# Patient Record
Sex: Female | Born: 1975 | Race: Black or African American | Hispanic: No | Marital: Single | Smoking: Current some day smoker
Health system: Southern US, Community
[De-identification: ages and names within clinical notes are randomized; demographics above are authoritative.]

## PROBLEM LIST (undated history)

## (undated) ENCOUNTER — Emergency Department (HOSPITAL_COMMUNITY): Discharge status: Against Medical Advice | Payer: Self-pay

## (undated) DIAGNOSIS — E0500 Thyrotoxicosis with diffuse goiter without thyrotoxic crisis or storm: Secondary | ICD-10-CM

## (undated) DIAGNOSIS — G8929 Other chronic pain: Secondary | ICD-10-CM

## (undated) DIAGNOSIS — E039 Hypothyroidism, unspecified: Secondary | ICD-10-CM

## (undated) DIAGNOSIS — M549 Dorsalgia, unspecified: Secondary | ICD-10-CM

## (undated) DIAGNOSIS — J9859 Other diseases of mediastinum, not elsewhere classified: Secondary | ICD-10-CM

## (undated) DIAGNOSIS — E320 Persistent hyperplasia of thymus: Principal | ICD-10-CM

## (undated) HISTORY — DX: Dorsalgia, unspecified: M54.9

## (undated) HISTORY — DX: Other chronic pain: G89.29

## (undated) HISTORY — DX: Other diseases of mediastinum, not elsewhere classified: J98.59

## (undated) HISTORY — DX: Thyrotoxicosis with diffuse goiter without thyrotoxic crisis or storm: E05.00

## (undated) HISTORY — PX: LIPOSUCTION: SHX10

## (undated) HISTORY — DX: Hypothyroidism, unspecified: E03.9

## (undated) HISTORY — DX: Persistent hyperplasia of thymus: E32.0

## (undated) HISTORY — PX: NM THYROID STIM SUPRESS: HXRAD602

---

## 2008-04-06 ENCOUNTER — Emergency Department (HOSPITAL_COMMUNITY): Admission: EM | Admit: 2008-04-06 | Discharge: 2008-04-06 | Payer: Self-pay | Admitting: Emergency Medicine

## 2010-05-26 LAB — COMPREHENSIVE METABOLIC PANEL
AST: 17 U/L (ref 0–37)
CO2: 26 mEq/L (ref 19–32)
Calcium: 8.7 mg/dL (ref 8.4–10.5)
Creatinine, Ser: 0.63 mg/dL (ref 0.4–1.2)
GFR calc Af Amer: >60 mL/min (ref >60)
GFR calc non Af Amer: >60 mL/min (ref >60)
Glucose, Bld: 99 mg/dL (ref 70–99)

## 2010-05-26 LAB — CBC
MCHC: 34.2 g/dL (ref 30.0–36.0)
MCV: 85.3 fL (ref 78.0–100.0)
RBC: 4.36 MIL/uL (ref 3.87–5.11)

## 2010-05-26 LAB — URINALYSIS, ROUTINE W REFLEX MICROSCOPIC
Leukocytes, UA: NEGATIVE
Nitrite: NEGATIVE
Specific Gravity, Urine: 1.038 — ABNORMAL HIGH (ref 1.005–1.030)
pH: 6 (ref 5.0–8.0)

## 2010-05-26 LAB — URINE MICROSCOPIC-ADD ON

## 2010-05-26 LAB — LIPASE, BLOOD: Lipase: 19 U/L (ref 11–59)

## 2010-05-26 LAB — POCT PREGNANCY, URINE: Preg Test, Ur: NEGATIVE

## 2010-05-26 LAB — DIFFERENTIAL
Lymphocytes Relative: 4 % — ABNORMAL LOW (ref 12–46)
Lymphs Abs: 0.3 10*3/uL — ABNORMAL LOW (ref 0.7–4.0)
Neutrophils Relative %: 92 % — ABNORMAL HIGH (ref 43–77)

## 2011-06-01 ENCOUNTER — Encounter (HOSPITAL_COMMUNITY): Payer: Self-pay

## 2011-06-01 ENCOUNTER — Emergency Department (HOSPITAL_COMMUNITY)
Admission: EM | Admit: 2011-06-01 | Discharge: 2011-06-01 | Disposition: A | Discharge status: Home / Self Care | Payer: Self-pay | Attending: Emergency Medicine | Admitting: Emergency Medicine

## 2011-06-01 DIAGNOSIS — J45909 Unspecified asthma, uncomplicated: Secondary | ICD-10-CM | POA: Insufficient documentation

## 2011-06-01 DIAGNOSIS — F17200 Nicotine dependence, unspecified, uncomplicated: Secondary | ICD-10-CM | POA: Insufficient documentation

## 2011-06-01 DIAGNOSIS — J45901 Unspecified asthma with (acute) exacerbation: Secondary | ICD-10-CM

## 2011-06-01 MED ORDER — IPRATROPIUM BROMIDE 0.02 % IN SOLN
1.0000 mg | Freq: Once | RESPIRATORY_TRACT | Status: AC
  Administered 2011-06-01: 1 mg via RESPIRATORY_TRACT
  Filled 2011-06-01: qty 5

## 2011-06-01 MED ORDER — ALBUTEROL SULFATE (2.5 MG/3ML) 0.083% IN NEBU: 2.5000 mg | INHALATION_SOLUTION | RESPIRATORY_TRACT | Status: DC | PRN

## 2011-06-01 MED ORDER — ALBUTEROL SULFATE (5 MG/ML) 0.5% IN NEBU
10.0000 mg | INHALATION_SOLUTION | Freq: Once | RESPIRATORY_TRACT | Status: AC
  Administered 2011-06-01: 10 mg via RESPIRATORY_TRACT
  Filled 2011-06-01: qty 2

## 2011-06-01 MED ORDER — PREDNISONE 20 MG PO TABS
60.0000 mg | ORAL_TABLET | Freq: Once | ORAL | Status: AC
  Administered 2011-06-01: 60 mg via ORAL
  Filled 2011-06-01: qty 3

## 2011-06-01 MED ORDER — PREDNISONE 20 MG PO TABS: 20.0000 mg | ORAL_TABLET | Freq: Every day | ORAL | Status: AC

## 2011-06-01 NOTE — ED Provider Notes (Signed)
History     CSN: [id number]  Arrival date & time 06/01/11  1032   First MD Initiated Contact with Patient 06/01/11 1040      Chief Complaint  Patient presents with  . Asthma     HPI Pt was seen at 1105.  Per pt, c/o gradual onset and persistence of constant cough, wheezing, and SOB for the past 3 days.  Describes her symptoms as per her usual "asthma attack."  Pt has been using her home neb and MDI without relief.  Denies CP/palpitations, no abd pain, no N/V/D, no back pain, no fevers.      Past Medical History  Diagnosis Date  . Asthma     History reviewed. No pertinent past surgical history.    History  Substance Use Topics  . Smoking status: Current Everyday Smoker  . Smokeless tobacco: Not on file  . Alcohol Use: No    Review of Systems ROS: Statement: All systems negative except as marked or noted in the HPI; Constitutional: Negative for fever and chills. ; ; Eyes: Negative for eye pain, redness and discharge. ; ; ENMT: Negative for ear pain, hoarseness, nasal congestion, sinus pressure and sore throat. ; ; Cardiovascular: Negative for chest pain, palpitations, diaphoresis, and peripheral edema. ; ; Respiratory: +cough, wheezing, SOB.  Negative for stridor. ; ; Gastrointestinal: Negative for nausea, vomiting, diarrhea, abdominal pain, blood in stool, hematemesis, jaundice and rectal bleeding. . ; ; Genitourinary: Negative for dysuria, flank pain and hematuria. ; ; Musculoskeletal: Negative for back pain and neck pain. Negative for swelling and trauma.; ; Skin: Negative for pruritus, rash, abrasions, blisters, bruising and skin lesion.; ; Neuro: Negative for headache, lightheadedness and neck stiffness. Negative for weakness, altered level of consciousness , altered mental status, extremity weakness, paresthesias, involuntary movement, seizure and syncope.      Allergies  Review of patient's allergies indicates no known allergies.  Home Medications   Current Outpatient  Rx  Name Route Sig Dispense Refill  . ALBUTEROL SULFATE HFA 108 (90 BASE) MCG/ACT IN AERS Inhalation Inhale 2 puffs into the lungs every 6 (six) hours as needed. Shortness of breath    . DIPHENHYDRAMINE HCL (SLEEP) 25 MG PO TABS Oral Take 25 mg by mouth daily as needed.    Marland Kitchen LORATADINE 10 MG PO TABS Oral Take 10 mg by mouth daily.      BP 134/71  Pulse 83  Temp(Src) 97.7 F (36.5 C) (Oral)  Resp 20  SpO2 100%  LMP 05/18/2011  Physical Exam 1110: Physical examination:  Nursing notes reviewed; Vital signs and O2 SAT reviewed;  Constitutional: Well developed, Well nourished, Well hydrated, In no acute distress; Head:  Normocephalic, atraumatic; Eyes: EOMI, PERRL, No scleral icterus; ENMT: Mouth and pharynx normal, Mucous membranes moist; Neck: Supple, Full range of motion, No lymphadenopathy; Cardiovascular: Regular rate and rhythm, No murmur, rub, or gallop; Respiratory: Breath sounds diminished, clear & equal bilaterally, No wheezes, speaking full sentences, Normal respiratory effort/excursion; Chest: Nontender, Movement normal; Abdomen: Soft, Nontender, Nondistended, Normal bowel sounds; Extremities: Pulses normal, No tenderness, No edema, No calf edema or asymmetry.; Neuro: AA&Ox3, Major CN grossly intact.  No gross focal motor or sensory deficits in extremities.; Skin: Color normal, Warm, Dry, no rash.    ED Course  Procedures    MDM  MDM Reviewed: nursing note and vitals Interpretation: ECG    Date: 06/01/2011  Rate: 80  Rhythm: normal sinus rhythm  QRS Axis: normal  Intervals: normal  ST/T Wave abnormalities:  normal  Conduction Disutrbances:none  Narrative Interpretation:   Old EKG Reviewed: none available.    1:24 PM:  Pt feels "much better" after neb and prednisone.  Wants to go home now.  Lungs CTA bilat, resps easy, speaking full sentences, Sats 99% R/A.  Dx d/w pt and family.  Questions answered.  Verb understanding, agreeable to d/c home with outpt  f/u.             Laray Anger, DO 06/03/11 1215

## 2011-06-01 NOTE — ED Notes (Signed)
[facility] contacted to start pt's neb tx

## 2011-06-01 NOTE — ED Notes (Signed)
Pt in from home with exacerbation of asthma states no relief with inhaler denies pain

## 2011-06-01 NOTE — ED Notes (Signed)
Pt reports difficulty breathing intermittently since Saturday.  Pt reports she's used her inhaler and neb tx without relief.  Pt also reports non-productive cough.  Pt reports hx of asthma-has been hospitalized in the past and was also intubated.  Pt is a smoker.  Pt denies cp but reports feeling tightness.  Lungs sounds clear bilaterally.  Pt in no distress.  Denies any fever

## 2011-06-01 NOTE — Discharge Instructions (Signed)
RESOURCE GUIDE  Dental Problems  Patients with Medicaid: [facility]                     [facility] [address]                                           [address] Phone:  [phone]                                                  Phone:  [phone]  If unable to pay or uninsured, contact:  [facility] or [facility] to become qualified for the adult dental clinic.  Chronic Pain Problems Contact [facility]  [phone] Patients need to be referred by their primary care doctor.  Insufficient Money for Medicine Contact [employer]:  call "211" or [facility] 271-5999.  No Primary Care Doctor Call [facility]  [phone] Other agencies that provide inexpensive medical care    [facility]  [phone]    [facility]  [phone]    [facility]  [phone]    Women[facility] Clinic  [phone]    [facility]  [phone]    [facility]  [phone]  Psychological Services [facility]  [phone] [facility]  [phone] [facility]   [phone] (emergency services [phone])  Substance Abuse Resources [facility]  336-882-2125 [facility] 336-784-9470 [facility] 336-285-9073 [facility] 336-845-3988 Residential & Outpatient Substance Abuse Program  800-659-3381  Abuse/Neglect [place] Child Abuse Hotline (336) 641-3795 [place] Child Abuse Hotline 800-378-5315 (After Hours)  Emergency Shelter [facility] (336) 271-5985  M[facility] (336) 275-9566 [facility] (704) 372-4663  MRSA Hotline #:   832-7006    [place] [facility]  [facility] [place]     [employer]                          [facility] [address] [place]                       [address]      [address]  [place]                                                [place]                            [place] Phone:  [phone]                                   Phone:  [phone]                 Phone:  [phone]  [facility] Phone:  [phone]    [facility] Child Abuse Hotline 984 504 2377 605-415-7277 (After Hours)   Take the prescriptions as directed.  Use your albuterol inhaler (2 to 4 puffs) or your albuterol nebulizer (1 unit dose) every 4 hours for the next 7 days, then as needed for cough, wheezing, or shortness of breath.  Call your regular medical doctor today to schedule a follow up appointment within the next 3 days.  Return to the Emergency Department immediately sooner if worsening.

## 2012-03-11 DIAGNOSIS — E320 Persistent hyperplasia of thymus: Secondary | ICD-10-CM

## 2012-03-11 HISTORY — DX: Persistent hyperplasia of thymus: E32.0

## 2012-03-21 ENCOUNTER — Encounter (HOSPITAL_COMMUNITY): Payer: Self-pay | Admitting: Emergency Medicine

## 2012-03-21 ENCOUNTER — Inpatient Hospital Stay (HOSPITAL_COMMUNITY)
Admission: EM | Admit: 2012-03-21 | Discharge: 2012-03-23 | DRG: 203 | Disposition: A | Discharge status: Home / Self Care | Payer: MEDICAID | Attending: Internal Medicine | Admitting: Internal Medicine

## 2012-03-21 ENCOUNTER — Emergency Department (HOSPITAL_COMMUNITY): Payer: Self-pay

## 2012-03-21 DIAGNOSIS — J9859 Other diseases of mediastinum, not elsewhere classified: Secondary | ICD-10-CM

## 2012-03-21 DIAGNOSIS — Z8639 Personal history of other endocrine, nutritional and metabolic disease: Secondary | ICD-10-CM | POA: Clinically undetermined

## 2012-03-21 DIAGNOSIS — J45901 Unspecified asthma with (acute) exacerbation: Principal | ICD-10-CM | POA: Diagnosis present

## 2012-03-21 DIAGNOSIS — F17200 Nicotine dependence, unspecified, uncomplicated: Secondary | ICD-10-CM | POA: Diagnosis present

## 2012-03-21 DIAGNOSIS — R000 Tachycardia, unspecified: Secondary | ICD-10-CM | POA: Diagnosis present

## 2012-03-21 DIAGNOSIS — D509 Iron deficiency anemia, unspecified: Secondary | ICD-10-CM | POA: Diagnosis present

## 2012-03-21 DIAGNOSIS — D649 Anemia, unspecified: Secondary | ICD-10-CM | POA: Diagnosis present

## 2012-03-21 DIAGNOSIS — Z720 Tobacco use: Secondary | ICD-10-CM | POA: Diagnosis present

## 2012-03-21 LAB — CBC WITH DIFFERENTIAL/PLATELET
Basophils Relative: 0 % (ref 0–1)
Eosinophils Relative: 0 % (ref 0–5)
HCT: 36.8 % (ref 36.0–46.0)
Hemoglobin: 11.9 g/dL — ABNORMAL LOW (ref 12.0–15.0)
MCHC: 32.3 g/dL (ref 30.0–36.0)
MCV: 79.8 fL (ref 78.0–100.0)
Monocytes Absolute: 0.2 10*3/uL (ref 0.1–1.0)
Monocytes Relative: 2 % — ABNORMAL LOW (ref 3–12)
Neutro Abs: 5.8 10*3/uL (ref 1.7–7.7)

## 2012-03-21 LAB — POCT I-STAT, CHEM 8
BUN: 13 mg/dL (ref 6–23)
Calcium, Ion: 1.19 mmol/L (ref 1.12–1.23)
Chloride: 108 mEq/L (ref 96–112)
Glucose, Bld: 120 mg/dL — ABNORMAL HIGH (ref 70–99)

## 2012-03-21 MED ORDER — ONDANSETRON HCL 4 MG PO TABS: 4.0000 mg | ORAL_TABLET | Freq: Four times a day (QID) | ORAL | Status: DC | PRN

## 2012-03-21 MED ORDER — SODIUM CHLORIDE 0.9 % IJ SOLN
3.0000 mL | Freq: Two times a day (BID) | INTRAMUSCULAR | Status: DC
  Administered 2012-03-22 (×3): 3 mL via INTRAVENOUS

## 2012-03-21 MED ORDER — ENOXAPARIN SODIUM 40 MG/0.4ML ~~LOC~~ SOLN
40.0000 mg | SUBCUTANEOUS | Status: DC
  Administered 2012-03-22: 40 mg via SUBCUTANEOUS
  Filled 2012-03-21: qty 0.4

## 2012-03-21 MED ORDER — ACETAMINOPHEN 650 MG RE SUPP: 650.0000 mg | Freq: Four times a day (QID) | RECTAL | Status: DC | PRN

## 2012-03-21 MED ORDER — ALBUTEROL SULFATE (5 MG/ML) 0.5% IN NEBU
2.5000 mg | INHALATION_SOLUTION | Freq: Four times a day (QID) | RESPIRATORY_TRACT | Status: DC
  Administered 2012-03-22 (×2): 2.5 mg via RESPIRATORY_TRACT
  Filled 2012-03-21 (×2): qty 0.5

## 2012-03-21 MED ORDER — METHYLPREDNISOLONE SODIUM SUCC 125 MG IJ SOLR
125.0000 mg | Freq: Once | INTRAMUSCULAR | Status: AC
  Administered 2012-03-21: 125 mg via INTRAVENOUS
  Filled 2012-03-21: qty 2

## 2012-03-21 MED ORDER — SODIUM CHLORIDE 0.9 % IV SOLN
INTRAVENOUS | Status: DC
  Administered 2012-03-22: 02:00:00 via INTRAVENOUS

## 2012-03-21 MED ORDER — POTASSIUM CHLORIDE CRYS ER 20 MEQ PO TBCR
20.0000 meq | EXTENDED_RELEASE_TABLET | Freq: Once | ORAL | Status: AC
  Administered 2012-03-21: 20 meq via ORAL
  Filled 2012-03-21: qty 1

## 2012-03-21 MED ORDER — ALBUTEROL (5 MG/ML) CONTINUOUS INHALATION SOLN
10.0000 mg/h | INHALATION_SOLUTION | RESPIRATORY_TRACT | Status: AC
  Administered 2012-03-21: 10 mg/h via RESPIRATORY_TRACT
  Filled 2012-03-21: qty 20

## 2012-03-21 MED ORDER — ALBUTEROL SULFATE (5 MG/ML) 0.5% IN NEBU
5.0000 mg | INHALATION_SOLUTION | Freq: Once | RESPIRATORY_TRACT | Status: AC
  Administered 2012-03-21: 5 mg via RESPIRATORY_TRACT
  Filled 2012-03-21: qty 1

## 2012-03-21 MED ORDER — ONDANSETRON HCL 4 MG/2ML IJ SOLN
4.0000 mg | Freq: Four times a day (QID) | INTRAMUSCULAR | Status: DC | PRN
  Administered 2012-03-22: 4 mg via INTRAVENOUS
  Filled 2012-03-21: qty 2

## 2012-03-21 MED ORDER — METHYLPREDNISOLONE SODIUM SUCC 40 MG IJ SOLR
40.0000 mg | INTRAMUSCULAR | Status: DC
  Filled 2012-03-21: qty 1

## 2012-03-21 MED ORDER — IPRATROPIUM BROMIDE 0.02 % IN SOLN
0.5000 mg | Freq: Once | RESPIRATORY_TRACT | Status: AC
  Administered 2012-03-21: 0.5 mg via RESPIRATORY_TRACT
  Filled 2012-03-21: qty 2.5

## 2012-03-21 MED ORDER — BUDESONIDE 0.25 MG/2ML IN SUSP
0.2500 mg | Freq: Two times a day (BID) | RESPIRATORY_TRACT | Status: DC
  Administered 2012-03-22 (×3): 0.25 mg via RESPIRATORY_TRACT
  Filled 2012-03-21 (×6): qty 2

## 2012-03-21 MED ORDER — ACETAMINOPHEN 325 MG PO TABS: 650.0000 mg | ORAL_TABLET | Freq: Four times a day (QID) | ORAL | Status: DC | PRN

## 2012-03-21 MED ORDER — ALBUTEROL SULFATE (5 MG/ML) 0.5% IN NEBU: 2.5000 mg | INHALATION_SOLUTION | RESPIRATORY_TRACT | Status: DC | PRN

## 2012-03-21 NOTE — ED Notes (Signed)
PA Browning at bedside. 

## 2012-03-21 NOTE — H&P (Signed)
Lacey Mccarty is an 37 y.o. female. Patient was seen and examined on March 21, 2012. PCP - none.   Chief Complaint: Shortness of breath. HPI: 37 year-old female with known history of asthma since age 59 and has been intubated twice previously last one 2 years ago presents with increasing shortness of breath over the last one week. Patient states that she has been having wheezing which has progressed and worsened and she had tried her friend's medications including prednisone which has not helped her. Chest x-ray did not show acute and in the ER patient was found to be wheezing and was given nebulizer treatment. Patient's wheezing is improved but still mildly short of breath. Also has experienced some pleuritic-type of chest pain since morning today. Pain happens only on deep coughing more on the right side of the chest. Denies any exertional symptoms fever chills productive cough.  Past Medical History  Diagnosis Date  . Asthma     Past Surgical History  Procedure Laterality Date  . Liposuction    . Cesarean section      Family History  Problem Relation Age of Onset  . HIV Mother   . Diabetes Mellitus II Mother   . Hypertension Father    Social History:  reports that she has been smoking.  She has never used smokeless tobacco. She reports that  drinks alcohol. She reports that she does not use illicit drugs.  Allergies: No Known Allergies   (Not in a hospital admission)  Results for orders placed during the hospital encounter of 03/21/12 (from the past 48 hour(s))  CBC WITH DIFFERENTIAL     Status: Abnormal   Collection Time    03/21/12  8:58 PM      Result Value Range   WBC 7.1  4.0 - 10.5 K/uL   RBC 4.61  3.87 - 5.11 MIL/uL   Hemoglobin 11.9 (*) 12.0 - 15.0 g/dL   HCT 78.4  69.6 - 29.5 %   MCV 79.8  78.0 - 100.0 fL   MCH 25.8 (*) 26.0 - 34.0 pg   MCHC 32.3  30.0 - 36.0 g/dL   RDW 28.4  13.2 - 44.0 %   Platelets 258  150 - 400 K/uL   Neutrophils Relative 83 (*) 43 -  77 %   Neutro Abs 5.8  1.7 - 7.7 K/uL   Lymphocytes Relative 15  12 - 46 %   Lymphs Abs 1.1  0.7 - 4.0 K/uL   Monocytes Relative 2 (*) 3 - 12 %   Monocytes Absolute 0.2  0.1 - 1.0 K/uL   Eosinophils Relative 0  0 - 5 %   Eosinophils Absolute 0.0  0.0 - 0.7 K/uL   Basophils Relative 0  0 - 1 %   Basophils Absolute 0.0  0.0 - 0.1 K/uL  POCT I-STAT, CHEM 8     Status: Abnormal   Collection Time    03/21/12  9:11 PM      Result Value Range   Sodium 142  135 - 145 mEq/L   Potassium 3.1 (*) 3.5 - 5.1 mEq/L   Chloride 108  96 - 112 mEq/L   BUN 13  6 - 23 mg/dL   Creatinine, Ser 1.02  0.50 - 1.10 mg/dL   Glucose, Bld 725 (*) 70 - 99 mg/dL   Calcium, Ion 3.66  4.40 - 1.23 mmol/L   TCO2 23  0 - 100 mmol/L   Hemoglobin 12.9  12.0 - 15.0 g/dL   HCT  38.0  36.0 - 46.0 %   Dg Chest 2 View  03/21/2012  *RADIOLOGY REPORT*  Clinical Data: Chest pain  CHEST - 2 VIEW  Comparison: None.  Findings: Relatively low lung volumes with crowding of perihilar bronchovascular structures.  No focal infiltrate.  No effusion. Heart size upper limits normal.  Regional bones unremarkable.  IMPRESSION:  No acute disease   Original Report Authenticated By: D. Andria Rhein, MD     Review of Systems  Constitutional: Negative.   HENT: Negative.   Eyes: Negative.   Respiratory: Positive for cough, shortness of breath and wheezing.   Cardiovascular: Positive for chest pain.  Gastrointestinal: Negative.   Genitourinary: Negative.   Musculoskeletal: Negative.   Skin: Negative.   Neurological: Negative.   Endo/Heme/Allergies: Negative.   Psychiatric/Behavioral: Negative.     Blood pressure 139/72, pulse 112, temperature 98.3 F (36.8 C), temperature source Oral, resp. rate 28, last menstrual period 03/18/2012, SpO2 100.00%. Physical Exam  Constitutional: She is oriented to person, place, and time. She appears well-developed and well-nourished. No distress.  HENT:  Head: Normocephalic and atraumatic.  Right Ear:  External ear normal.  Left Ear: External ear normal.  Nose: Nose normal.  Mouth/Throat: Oropharynx is clear and moist. No oropharyngeal exudate.  Eyes: Conjunctivae are normal. Pupils are equal, round, and reactive to light. Right eye exhibits no discharge. Left eye exhibits no discharge. No scleral icterus.  Neck: Normal range of motion. Neck supple.  Cardiovascular: Regular rhythm.   Mild tachycardia.  Respiratory: Effort normal and breath sounds normal. No respiratory distress. She has no wheezes. She has no rales.  GI: Soft. Bowel sounds are normal. She exhibits no distension. There is no tenderness. There is no rebound.  Musculoskeletal: She exhibits no edema and no tenderness.  Neurological: She is alert and oriented to person, place, and time.  Moves all extremities.  Skin: Skin is warm and dry. She is not diaphoretic.     Assessment/Plan #1. Asthma exacerbation - continue with nebulizer treatments Pulmicort and IV steroids. Since patient has pleuritic-type of chest pain we will check d-dimer and troponin. EKG unremarkable. #2. Tobacco abuse - strongly advised to quit smoking. #3. Mild normocytic normochromic anemia - follow CBC and if there is no significant fall in hemoglobin then further workup as outpatient.  CODE STATUS - full code.  Eduard Clos 03/21/2012, 9:58 PM

## 2012-03-21 NOTE — ED Provider Notes (Signed)
History     CSN: [id number]  Arrival date & time 03/21/12  1748   First MD Initiated Contact with Patient 03/21/12 1758      Chief Complaint  Patient presents with  . Asthma    (Consider location/radiation/quality/duration/timing/severity/associated sxs/prior treatment) HPI Comments: This is a 37 year old female, who presents emergency department with chief complaint of asthma exacerbation. Patient has past medical history remarkable for asthma. She states that she has been wheezing for the past 3 days. She is tried taking her inhaler with no relief. Additionally, she is tried taking prednisone that her friend gave her, this gave her some relief. She believes that the weather has been exacerbating her symptoms. She is not in any pain.  The history is provided by the patient. No language interpreter was used.    Past Medical History  Diagnosis Date  . Asthma     History reviewed. No pertinent past surgical history.  No family history on file.  History  Substance Use Topics  . Smoking status: Current Every Day Smoker  . Smokeless tobacco: Not on file  . Alcohol Use: No    OB History   Grav Para Term Preterm Abortions TAB SAB Ect Mult Living                  Review of Systems  All other systems reviewed and are negative.    Allergies  Review of patient's allergies indicates no known allergies.  Home Medications  No current outpatient prescriptions on file.  BP 136/81  Pulse 100  Temp(Src) 98.4 F (36.9 C) (Oral)  Resp 23  SpO2 100%  LMP 03/18/2012  Physical Exam  Nursing note and vitals reviewed. Constitutional: She is oriented to person, place, and time. She appears well-developed and well-nourished.  HENT:  Head: Normocephalic and atraumatic.  Eyes: Conjunctivae and EOM are normal. Pupils are equal, round, and reactive to light.  Neck: Normal range of motion. Neck supple.  Cardiovascular: Normal rate and regular rhythm.  Exam reveals no gallop and no  friction rub.   No murmur heard. Pulmonary/Chest: Effort normal. No respiratory distress. She has wheezes. She has no rales. She exhibits no tenderness.  Diffuse wheezes heard bilaterally  Abdominal: Soft. Bowel sounds are normal. She exhibits no distension and no mass. There is no tenderness. There is no rebound and no guarding.  Musculoskeletal: Normal range of motion. She exhibits no edema and no tenderness.  Neurological: She is alert and oriented to person, place, and time.  Skin: Skin is warm and dry.  Psychiatric: She has a normal mood and affect. Her behavior is normal. Judgment and thought content normal.    ED Course  Procedures (including critical care time)  Labs Reviewed - No data to display No results found. Will treat the patient with Atrovent, and albuterol, will give patient IV Solu-Medrol, and reevaluate. 10:43 PM Patient has received albuterol, Atrovent, and IV Solu-Medrol. Additionally she is received a continuous nebulizer for one hour. She reports still feeling out of breath, but her wheezing has improved. She expresses concern about going home, she has been hospitalized for asthma exacerbations in the past. I discussed patient with Dr. Ranae Palms, who tells the patient likely needs to be admitted. I will consult the hospitalist team for admission. 1. Asthma exacerbation   2. Anemia   3. Tobacco abuse       MDM  37 year old female with asthma exacerbation. I reassessed the patient several times throughout her ED course, and found that  her wheezing was improving, however her shortness of breath remains. She denied any chest pain, and I believe this to be acute bronchospasm secondary to asthma exacerbation. Have consult the hospitalist team, who will admit the patient.        Roxy Horseman, PA-C 03/21/12 2245

## 2012-03-21 NOTE — ED Notes (Signed)
Attempted to call report to [facility]. They stated they did not have a nurse to take report until 2300. Will call back at 2300.

## 2012-03-21 NOTE — ED Notes (Signed)
Patient transported to X-ray 

## 2012-03-21 NOTE — ED Notes (Signed)
States that she has had shortness of breath for the past 3 days. States that she has taken her rescue inhaler and nebs with no resolve.

## 2012-03-21 NOTE — ED Provider Notes (Signed)
Medical screening examination/treatment/procedure(s) were performed by non-physician practitioner and as supervising physician I was immediately available for consultation/collaboration.   Cleburne Savini, MD 03/21/12 2329 

## 2012-03-21 NOTE — ED Notes (Signed)
Attempted to call report for second time. RN remains unable to take report at this time. Will call back.

## 2012-03-21 NOTE — ED Notes (Signed)
RT at bedside.

## 2012-03-21 NOTE — ED Notes (Signed)
MD at bedside. Dr. Toniann Fail at bedside.

## 2012-03-22 ENCOUNTER — Inpatient Hospital Stay (HOSPITAL_COMMUNITY): Payer: Self-pay

## 2012-03-22 DIAGNOSIS — R222 Localized swelling, mass and lump, trunk: Secondary | ICD-10-CM

## 2012-03-22 LAB — CBC
HCT: 33.9 % — ABNORMAL LOW (ref 36.0–46.0)
Hemoglobin: 11.6 g/dL — ABNORMAL LOW (ref 12.0–15.0)
MCH: 26 pg (ref 26.0–34.0)
MCH: 26.4 pg (ref 26.0–34.0)
MCHC: 32.7 g/dL (ref 30.0–36.0)
MCV: 79.8 fL (ref 78.0–100.0)
RDW: 12.9 % (ref 11.5–15.5)
RDW: 13.2 % (ref 11.5–15.5)
WBC: 5.9 10*3/uL (ref 4.0–10.5)

## 2012-03-22 LAB — RAPID URINE DRUG SCREEN, HOSP PERFORMED
Barbiturates: NOT DETECTED
Benzodiazepines: NOT DETECTED
Cocaine: NOT DETECTED
Tetrahydrocannabinol: POSITIVE — AB

## 2012-03-22 LAB — BASIC METABOLIC PANEL
BUN: 12 mg/dL (ref 6–23)
CO2: 21 mEq/L (ref 19–32)
Calcium: 8.9 mg/dL (ref 8.4–10.5)
Chloride: 103 mEq/L (ref 96–112)
Creatinine, Ser: 0.35 mg/dL — ABNORMAL LOW (ref 0.50–1.10)
Glucose, Bld: 161 mg/dL — ABNORMAL HIGH (ref 70–99)

## 2012-03-22 LAB — CREATININE, SERUM: Creatinine, Ser: 0.41 mg/dL — ABNORMAL LOW (ref 0.50–1.10)

## 2012-03-22 LAB — TROPONIN I: Troponin I: <0.3 ng/mL (ref <0.30)

## 2012-03-22 MED ORDER — MORPHINE SULFATE 2 MG/ML IJ SOLN
1.0000 mg | Freq: Once | INTRAMUSCULAR | Status: AC
  Administered 2012-03-22: 1 mg via INTRAVENOUS
  Filled 2012-03-22: qty 1

## 2012-03-22 MED ORDER — IOHEXOL 350 MG/ML SOLN
100.0000 mL | Freq: Once | INTRAVENOUS | Status: AC | PRN
  Administered 2012-03-22: 100 mL via INTRAVENOUS

## 2012-03-22 MED ORDER — LEVOFLOXACIN 500 MG PO TABS
500.0000 mg | ORAL_TABLET | Freq: Every day | ORAL | Status: DC
  Administered 2012-03-22 – 2012-03-23 (×2): 500 mg via ORAL
  Filled 2012-03-22 (×2): qty 1

## 2012-03-22 MED ORDER — LEVALBUTEROL HCL 0.63 MG/3ML IN NEBU
0.6300 mg | INHALATION_SOLUTION | Freq: Four times a day (QID) | RESPIRATORY_TRACT | Status: DC
  Administered 2012-03-22 (×2): 0.63 mg via RESPIRATORY_TRACT
  Filled 2012-03-22 (×9): qty 3

## 2012-03-22 MED ORDER — OXYCODONE-ACETAMINOPHEN 5-325 MG PO TABS
1.0000 | ORAL_TABLET | ORAL | Status: DC | PRN
  Administered 2012-03-22 – 2012-03-23 (×6): 1 via ORAL
  Filled 2012-03-22 (×6): qty 1

## 2012-03-22 MED ORDER — METHYLPREDNISOLONE SODIUM SUCC 125 MG IJ SOLR
60.0000 mg | Freq: Four times a day (QID) | INTRAMUSCULAR | Status: DC
  Administered 2012-03-22 – 2012-03-23 (×5): 60 mg via INTRAVENOUS
  Filled 2012-03-22 (×8): qty 0.96

## 2012-03-22 MED ORDER — ZOLPIDEM TARTRATE 5 MG PO TABS
5.0000 mg | ORAL_TABLET | Freq: Every evening | ORAL | Status: DC | PRN
  Administered 2012-03-22: 5 mg via ORAL
  Filled 2012-03-22: qty 1

## 2012-03-22 MED ORDER — NITROGLYCERIN 0.4 MG SL SUBL
SUBLINGUAL_TABLET | SUBLINGUAL | Status: AC
  Administered 2012-03-22 (×3)
  Filled 2012-03-22: qty 25

## 2012-03-22 NOTE — Care Management (Signed)
Cm spoke with pt with close friend at bedside. NO PCP noted. Per pt recently signed up for insurance with [employer]. Pt provided with PCP resources and number for [facility] to assist with finding [employer] providers in her area. Pt states able to afford medications upon receiving insurance card. Pt informed refer to [facility].       Roxy Manns Emalie Mcwethy,RN,BSN [phone]

## 2012-03-22 NOTE — Progress Notes (Addendum)
[facility] PROGRESS NOTE  Lacey Mccarty JWJ:[id number] DOB: 03/25/1975 DOA: 03/21/2012 PCP: No primary provider on file.  Assessment/Plan: 1. 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 -symptoms of myasthenia,  Also check LDH, AFP, smear etc  3. Tobacco abuse - strongly advised to quit smoking.   4.  Mild normocytic normochromic anemia - likely iron deficiency from menstrual blood loss, follow CBC and if there is no significant fall in hemoglobin then further workup as outpatient   Code Status: FULL Family Communication: d/w pt at bedside Disposition Plan: home when stable   Consultants:  IR  Procedures:    Antibiotics:  Levaquin 2/11  HPI/Subjective: Breathing better, has no PCP, cough improved too  Objective: Filed Vitals:   03/22/12 0104 03/22/12 0120 03/22/12 0603 03/22/12 0738  BP:  141/61 130/72   Pulse:  122 106   Temp:   97.8 F (36.6 C)   TempSrc:   Oral   Resp:   20   Height:      Weight:      SpO2: 99%  100% 96%    Intake/Output Summary (Last 24 hours) at 03/22/12 1024 Last data filed at 03/22/12 0900  Gross per 24 hour  Intake    243 ml  Output      0 ml  Net    243 ml   Filed Weights   03/21/12 2359  Weight: 85.9 kg (189 lb 6 oz)    Exam:   General:  AAOx3, no distress  Cardiovascular: SS2/RRR tachycardic  Respiratory: CTAB, poor air movement b/l  Abdomen: soft,NT,Bs present  Ext: no edema c/c  Data Reviewed: Basic Metabolic Panel:  Recent Labs Lab 03/21/12 2111 03/22/12 0001 03/22/12 0455  NA 142  --  135  K 3.1*  --  3.7  CL 108  --  103  CO2  --   --  21  GLUCOSE 120*  --  161*  BUN 13  --   12  CREATININE 0.50 0.41* 0.35*  CALCIUM  --   --  8.9   Liver Function Tests: No results found for this basename: AST, ALT, ALKPHOS, BILITOT, PROT, ALBUMIN,  in the last 168 hours No results found for this basename: LIPASE, AMYLASE,  in the last 168 hours No results found for this basename: AMMONIA,  in the last 168 hours CBC:  Recent Labs Lab 03/21/12 2058 03/21/12 2111 03/22/12 0001 03/22/12 0455  WBC 7.1  --  7.1 5.9  NEUTROABS 5.8  --   --   --   HGB 11.9* 12.9 11.6* 11.2*  HCT 36.8 38.0 35.5* 33.9*  MCV 79.8  --  79.6 79.8  PLT 258  --  274 262   Cardiac Enzymes:  Recent Labs Lab 03/22/12 0001  TROPONINI <0.30   BNP (last 3 results) No results found for this basename: PROBNP,  in the last 8760 hours CBG: No results found for this basename: GLUCAP,  in the last 168 hours  No results found for this or any previous visit (from the past 240 hour(s)).   Studies: Dg Chest 2 View  03/21/2012  *RADIOLOGY REPORT*  Clinical Data: Chest pain  CHEST - 2 VIEW  Comparison: None.  Findings:  Relatively low lung volumes with crowding of perihilar bronchovascular structures.  No focal infiltrate.  No effusion. Heart size upper limits normal.  Regional bones unremarkable.  IMPRESSION:  No acute disease   Original Report Authenticated By: D. Hassell III, MD    Ct Angio Chest Pe W/cm &/or Wo Cm  03/22/2012  *RADIOLOGY REPORT*  Clinical Data: Right upper chest pain, worsening shortness of breath and wheezing.  History of asthma and smoking.  CT ANGIOGRAPHY CHEST  Technique:  Multidetector CT imaging of the chest using the standard protocol during bolus administration of intravenous contrast. Multiplanar reconstructed images including MIPs were obtained and reviewed to evaluate the vascular anatomy.  Contrast: OMNIPAQUE IOHEXOL 350 MG/ML SOLN  Comparison: Chest radiograph performed 03/21/2012  Findings: There is no evidence of pulmonary embolus.  The lungs are essentially clear  bilaterally.  There is no evidence of significant focal consolidation, pleural effusion or pneumothorax.  No masses are identified; no abnormal focal contrast enhancement is seen.  A prominent anterior mediastinal mass is noted at the expected location of the thymus, measuring 5.6 x 5.0 x 3.6 cm.  This is much larger than expected for residual thymic tissue, and could reflect a thymoma.  Given its appearance, lymphoma is considered much less likely.  No axillary lymphadenopathy is seen.  The visualized portions of the thyroid gland are unremarkable in appearance.  The visualized portions of the liver and spleen are unremarkable.  No acute osseous abnormalities are seen.  IMPRESSION:  1.  No evidence of pulmonary embolus. 2.  Lungs clear bilaterally. 3.  Anterior mediastinal mass noted at the expected location of the thymus, measuring 5.6 x 5.0 x 3.6 cm.  This is much larger than expected for residual thymic tissue given the patient's age, and could reflect a thymoma.  Given its appearance, lymphoma is considered much less likely.  Further evaluation would be helpful as deemed clinically appropriate.   Original Report Authenticated By: Tonia Ghent, M.D.     Scheduled Meds: . budesonide (PULMICORT) nebulizer solution  0.25 mg Nebulization BID  . enoxaparin (LOVENOX) injection  40 mg Subcutaneous Q24H  . levalbuterol  0.63 mg Nebulization Q6H  . methylPREDNISolone (SOLU-MEDROL) injection  40 mg Intravenous Q24H  . sodium chloride  3 mL Intravenous Q12H   Continuous Infusions: . sodium chloride 10 mL/hr at 03/22/12 1610    Principal Problem:   Asthma exacerbation Active Problems:   Anemia   Tobacco abuse    Time spent:    [facility]  [facility] Pager [phone]. If 8PM-8AM, please contact night-coverage at [email or web address], password [facility] 03/22/2012, 10:24 AM  LOS: 1 day

## 2012-03-23 DIAGNOSIS — Z8639 Personal history of other endocrine, nutritional and metabolic disease: Secondary | ICD-10-CM | POA: Clinically undetermined

## 2012-03-23 LAB — INFLUENZA PANEL BY PCR (TYPE A & B)
Influenza A By PCR: NEGATIVE
Influenza B By PCR: NEGATIVE

## 2012-03-23 LAB — AFP TUMOR MARKER: AFP-Tumor Marker: 3.3 ng/mL (ref 0.0–8.0)

## 2012-03-23 MED ORDER — OXYCODONE-ACETAMINOPHEN 5-325 MG PO TABS: 1.0000 | ORAL_TABLET | Freq: Four times a day (QID) | ORAL | Status: DC | PRN

## 2012-03-23 MED ORDER — LEVOFLOXACIN 250 MG PO TABS: 250.0000 mg | ORAL_TABLET | Freq: Every day | ORAL | Status: DC

## 2012-03-23 MED ORDER — ALBUTEROL SULFATE HFA 108 (90 BASE) MCG/ACT IN AERS: 2.0000 | INHALATION_SPRAY | RESPIRATORY_TRACT | Status: DC | PRN

## 2012-03-23 MED ORDER — PREDNISONE (PAK) 10 MG PO TABS: 10.0000 mg | ORAL_TABLET | Freq: Every day | ORAL | Status: DC

## 2012-03-23 MED ORDER — METHIMAZOLE 10 MG PO TABS: 10.0000 mg | ORAL_TABLET | Freq: Two times a day (BID) | ORAL | Status: DC

## 2012-03-23 MED ORDER — OXYCODONE-ACETAMINOPHEN 5-325 MG PO TABS
1.0000 | ORAL_TABLET | Freq: Four times a day (QID) | ORAL | Status: DC | PRN
Start: 2012-03-23 — End: 2012-03-23

## 2012-03-27 ENCOUNTER — Encounter: Payer: Self-pay | Admitting: Thoracic Surgery (Cardiothoracic Vascular Surgery)

## 2012-03-27 ENCOUNTER — Institutional Professional Consult (permissible substitution) (INDEPENDENT_AMBULATORY_CARE_PROVIDER_SITE_OTHER): Payer: Self-pay | Admitting: Thoracic Surgery (Cardiothoracic Vascular Surgery)

## 2012-03-27 VITALS — BP 126/80 | HR 74 | Resp 18 | Ht 63.5 in | Wt 189.0 lb

## 2012-03-27 DIAGNOSIS — E320 Persistent hyperplasia of thymus: Secondary | ICD-10-CM

## 2012-03-27 DIAGNOSIS — R222 Localized swelling, mass and lump, trunk: Secondary | ICD-10-CM

## 2012-03-27 DIAGNOSIS — E0590 Thyrotoxicosis, unspecified without thyrotoxic crisis or storm: Secondary | ICD-10-CM

## 2012-03-27 DIAGNOSIS — J9859 Other diseases of mediastinum, not elsewhere classified: Secondary | ICD-10-CM

## 2012-03-27 NOTE — Progress Notes (Signed)
PCP is No primary provider on file. Referring Provider is Zannie Cove, MD  Chief Complaint  Patient presents with  . Mediastinal Mass    f/u from ED consult, Chest CT 03/22/12    HPI: 37 year old presents with chief complaint of an anterior mediastinal mass found on CT scan.  Lacey Mccarty is a 37 year old woman with a history of asthma she was admitted to [facility] long last week with an acute asthma attack, which required hospitalization for management. She has a history of asthma back to childhood and has had to be intubated twice for severe episode of bronchospasm. Says that she's been feeling generally very anxious and a little jittery. She did notice that her heart rate had been up she sometimes felt some palpitations prior to the asthmatic event. On the day of her ostial addition she developed breath and wheezing which did not respond to her usual medications.  She was admitted for management of asthma. A CT of the chest was done to rule out pulmonary embolus. It showed an anterior mediastinal mass. I recommended that her thyroid function be tested, and she did turn out to be hyperthyroid. Beta hCG was negative and alpha-fetoprotein levels were normal. She was started on methimazole time of discharge. She will have a followup appointment with Dr. Lucianne Muss sometime in the next week or 2. She denied any weight loss and she also denied hair loss.   Past Medical History  Diagnosis Date  . Asthma     Past Surgical History  Procedure Laterality Date  . Liposuction    . Cesarean section      Family History  Problem Relation Age of Onset  . HIV Mother   . Diabetes Mellitus II Mother   . Hypertension Father     Social History History  Substance Use Topics  . Smoking status: Current Every Day Smoker -- 0.25 packs/day for 7 years  . Smokeless tobacco: Never Used  . Alcohol Use: Yes     Comment: occasional    Current Outpatient Prescriptions  Medication Sig Dispense Refill  .  albuterol (PROVENTIL HFA;VENTOLIN HFA) 108 (90 BASE) MCG/ACT inhaler Inhale 2 puffs into the lungs every 4 (four) hours as needed for wheezing.  1 Inhaler  2  . methimazole (TAPAZOLE) 10 MG tablet Take 1 tablet (10 mg total) by mouth 2 (two) times daily with a meal.  60 tablet  0  . oxyCODONE-acetaminophen (PERCOCET/ROXICET) 5-325 MG per tablet Take 1 tablet by mouth every 6 (six) hours as needed.  20 tablet  0  . predniSONE (STERAPRED UNI-PAK) 10 MG tablet Take 1-4 tablets (10-40 mg total) by mouth daily. Take 40mg  for 2 days then 20mg  for 2 days then 10mg  for 2 days then STOP  14 tablet  0   No current facility-administered medications for this visit.    No Known Allergies  Review of Systems  Constitutional: Negative for unexpected weight change.       Denies alopecia + generalized feeling of anxiousness  Respiratory: Positive for shortness of breath and wheezing.   Cardiovascular: Positive for palpitations.  Neurological: Positive for dizziness and headaches.  All other systems reviewed and are negative.    BP 126/80  Pulse 74  Resp 18  Ht 5' 3.5" (1.613 m)  Wt 189 lb (85.73 kg)  BMI 32.95 kg/m2  SpO2 98%  LMP 03/18/2012 Physical Exam  Vitals reviewed. Constitutional: She is oriented to person, place, and time. She appears well-developed and well-nourished. No distress.  HENT:  Head: Normocephalic and atraumatic.  Eyes: EOM are normal. Pupils are equal, round, and reactive to light.  No exopthalmos  Neck: Neck supple. No tracheal deviation present. Thyromegaly present.  Cardiovascular: Normal rate, regular rhythm, normal heart sounds and intact distal pulses.  Exam reveals no gallop and no friction rub.   No murmur heard. Pulmonary/Chest: Effort normal and breath sounds normal. She has no wheezes.  Abdominal: Soft. There is no tenderness.  Musculoskeletal: She exhibits no edema.  Lymphadenopathy:    She has no cervical adenopathy.  Neurological: She is alert and  oriented to person, place, and time. No cranial nerve deficit.  Skin: Skin is warm and dry.  Psychiatric: She has a normal mood and affect.     Diagnostic Tests:  CT of chest 03/22/12  Clinical Data: Right upper chest pain, worsening shortness of  breath and wheezing. History of asthma and smoking.  CT ANGIOGRAPHY CHEST  Technique: Multidetector CT imaging of the chest using the  standard protocol during bolus administration of intravenous  contrast. Multiplanar reconstructed images including MIPs were  obtained and reviewed to evaluate the vascular anatomy.  Contrast: OMNIPAQUE IOHEXOL 350 MG/ML SOLN  Comparison: Chest radiograph performed 03/21/2012  Findings: There is no evidence of pulmonary embolus.  The lungs are essentially clear bilaterally. There is no evidence  of significant focal consolidation, pleural effusion or  pneumothorax. No masses are identified; no abnormal focal contrast  enhancement is seen.  A prominent anterior mediastinal mass is noted at the expected  location of the thymus, measuring 5.6 x 5.0 x 3.6 cm. This is much  larger than expected for residual thymic tissue, and could reflect  a thymoma. Given its appearance, lymphoma is considered much less  likely. No axillary lymphadenopathy is seen. The visualized  portions of the thyroid gland are unremarkable in appearance.  The visualized portions of the liver and spleen are unremarkable.  No acute osseous abnormalities are seen.  IMPRESSION:  1. No evidence of pulmonary embolus.  2. Lungs clear bilaterally.  3. Anterior mediastinal mass noted at the expected location of the  thymus, measuring 5.6 x 5.0 x 3.6 cm. This is much larger than  expected for residual thymic tissue given the patient's age, and  could reflect a thymoma. Given its appearance, lymphoma is  considered much less likely. Further evaluation would be helpful  as deemed clinically appropriate.  Impression: Lacey Mccarty is a  37 year old woman with a history of asthma who was found to have an anterior mediastinal "mass" on a CT done to rule out pulmonary embolus. During the same admission she was found to be severely hyperthyroid. The mass follows the normal contours of the thymus gland. 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 Thymic hyperplasia is frequently seen in the setting of hyperthyroidism. Given that she was just diagnosed with hypothyroidism I would not recommend any further intervention at this point. 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  I had a long discussion with Lacey Mccarty and reviewed the films with her and her partner. We did discuss the differential diagnosis, as well as my opinion that this most likely is benign.  I did stress to her the absolute importance of a followup scan to make sure that this resolves with time and treatment. She does understand that I can not definitively rule out that there is not  a pathologic mass such as a thymoma or lymphoma based on one scan.  I recommended that she return in 6 months for followup CT of the chest.  Plan:  Followup with Dr. Lucianne Muss regarding hyperthyroidism  Return in 6 months for followup CT of chest

## 2012-03-28 ENCOUNTER — Encounter: Payer: Self-pay | Admitting: Thoracic Surgery (Cardiothoracic Vascular Surgery)

## 2012-03-28 ENCOUNTER — Encounter (HOSPITAL_COMMUNITY): Payer: Self-pay

## 2012-04-02 NOTE — Discharge Summary (Signed)
Physician Discharge Summary  Lacey Mccarty AVW:[id number] DOB: 08/13/1975 DOA: 03/21/2012  PCP: No primary provider on file.  Admit date: 03/21/2012 Discharge date: 03/24/2012  Time spent: 45 minutes  Recommendations for Outpatient Follow-up:  1. Dr.Hendrickson on 2/17 2. Dr.Kumar in 2 weeks  Discharge Diagnoses:  Principal Problem:   Asthma exacerbation Active Problems:   Anemia   Tobacco abuse   Mediastinal mass   Hyperthyroidism   Discharge Condition: stable  Diet recommendation: regular  Filed Weights   03/21/12 2359  Weight: 85.9 kg (189 lb 6 oz)    History of present illness:  37 year-old female with known history of asthma since age 43 and has been intubated twice previously last one 2 years ago presents with increasing shortness of breath over the last one week. Patient states that she has been having wheezing which has progressed and worsened and she had tried her friend's medications including prednisone which has not helped her. Chest x-ray did not show acute and in the ER patient was found to be wheezing and was given nebulizer treatment. Patient's wheezing is improved but still mildly short of breath. Also has experienced some pleuritic-type of chest pain since morning today. Pain happens only on deep coughing more on the right side of the chest. Denies any exertional symptoms fever chills productive cough.  Hospital Course:  1. Asthma exacerbation - improving, treated with IV solumedrol, albuterol/atrovent and levaquin   influenza PCR negative improved  2. Anterior mediastinal mas: ? Thymoma:  I called and d/w Dr.Hendrickson TCTS who reviewed CT scans, recommended checking TSH/T4 which was consistent with Hyperthyroidism, and AFP/HCG which are negative, he advised FU  next week to determine further course.   3. Hyperthyroidism: based on TSH and free T4I called and d/w Dr.Kumar who recommended starting MEthimazole and FU in his office in 2 weeks. She is advised  to be extremely careful about not getting pregnant while taking this medication.  4. Tobacco abuse - strongly advised to quit smoking.   4. Mild normocytic normochromic anemia - likely iron deficiency from menstrual blood loss,  further workup as outpatient     Consultations:  Dr.Hendrickson by Telephone  Dr.Kumar by Telephone  Discharge Exam: Filed Vitals:   03/22/12 2038 03/22/12 2145 03/23/12 0546 03/23/12 1400  BP:  127/53 138/73 143/78  Pulse:  109 111 102  Temp:  97.9 F (36.6 C) 97.5 F (36.4 C) 98.1 F (36.7 C)  TempSrc:  Oral Oral Oral  Resp:  20 18 16   Height:      Weight:      SpO2: 97% 98% 100% 94%    General: AAOx3C Cardiovascular: S1S2/RRR Respiratory: CTAB  Discharge Instructions  Discharge Orders   Future Orders Complete By Expires     Increase activity slowly  As directed         Medication List    TAKE these medications       albuterol 108 (90 BASE) MCG/ACT inhaler  Commonly known as:  PROVENTIL HFA;VENTOLIN HFA  Inhale 2 puffs into the lungs every 4 (four) hours as needed for wheezing.     methimazole 10 MG tablet  Commonly known as:  TAPAZOLE  Take 1 tablet (10 mg total) by mouth 2 (two) times daily with a meal.     oxyCODONE-acetaminophen 5-325 MG per tablet  Commonly known as:  PERCOCET/ROXICET  Take 1 tablet by mouth every 6 (six) hours as needed.     predniSONE 10 MG tablet  Commonly known as:  STERAPRED UNI-PAK  Take 1-4 tablets (10-40 mg total) by mouth daily. Take 40mg  for 2 days then 20mg  for 2 days then 10mg  for 2 days then STOP           Follow-up Information   Follow up with HENDRICKSON,STEVEN C, MD In 1 week. (Office will call you on Friday with appt)    Contact information:   [address] [employer] [address] [place] [address] [phone]       Follow up with [facility], MD In 2 weeks. (call for appt)    Contact information:   [address] [facility] [place]  [address] [phone]        The results of significant diagnostics from this hospitalization (including imaging, microbiology, ancillary and laboratory) are listed below for reference.    Significant Diagnostic Studies: Dg Chest 2 View  03/21/2012  *RADIOLOGY REPORT*  Clinical Data: Chest pain  CHEST - 2 VIEW  Comparison: None.  Findings: Relatively low lung volumes with crowding of perihilar bronchovascular structures.  No focal infiltrate.  No effusion. Heart size upper limits normal.  Regional bones unremarkable.  IMPRESSION:  No acute disease   Original Report Authenticated By: D. Hassell III, MD    Ct Angio Chest Pe W/cm &/or Wo Cm  03/22/2012  *RADIOLOGY REPORT*  Clinical Data: Right upper chest pain, worsening shortness of breath and wheezing.  History of asthma and smoking.  CT ANGIOGRAPHY CHEST  Technique:  Multidetector CT imaging of the chest using the standard protocol during bolus administration of intravenous contrast. Multiplanar reconstructed images including MIPs were obtained and reviewed to evaluate the vascular anatomy.  Contrast: OMNIPAQUE IOHEXOL 350 MG/ML SOLN  Comparison: Chest radiograph performed 03/21/2012  Findings: There is no evidence of pulmonary embolus.  The lungs are essentially clear bilaterally.  There is no evidence of significant focal consolidation, pleural effusion or pneumothorax.  No masses are identified; no abnormal focal contrast enhancement is seen.  A prominent anterior mediastinal mass is noted at the expected location of the thymus, measuring 5.6 x 5.0 x 3.6 cm.  This is much larger than expected for residual thymic tissue, and could reflect a thymoma.  Given its appearance, lymphoma is considered much less likely.  No axillary lymphadenopathy is seen.  The visualized portions of the thyroid gland are unremarkable in appearance.  The visualized portions of the liver and spleen are unremarkable.  No acute osseous abnormalities are seen.  IMPRESSION:   1.  No evidence of pulmonary embolus. 2.  Lungs clear bilaterally. 3.  Anterior mediastinal mass noted at the expected location of the thymus, measuring 5.6 x 5.0 x 3.6 cm.  This is much larger than expected for residual thymic tissue given the patient's age, and could reflect a thymoma.  Given its appearance, lymphoma is considered much less likely.  Further evaluation would be helpful as deemed clinically appropriate.   Original Report Authenticated By: Tonia Ghent, M.D.     Microbiology: No results found for this or any previous visit (from the past 240 hour(s)).   Labs: Basic Metabolic Panel: No results found for this basename: NA, K, CL, CO2, GLUCOSE, BUN, CREATININE, CALCIUM, MG, PHOS,  in the last 168 hours Liver Function Tests: No results found for this basename: AST, ALT, ALKPHOS, BILITOT, PROT, ALBUMIN,  in the last 168 hours No results found for this basename: LIPASE, AMYLASE,  in the last 168 hours No results found for this basename: AMMONIA,  in the  last 168 hours CBC: No results found for this basename: WBC, NEUTROABS, HGB, HCT, MCV, PLT,  in the last 168 hours Cardiac Enzymes: No results found for this basename: CKTOTAL, CKMB, CKMBINDEX, TROPONINI,  in the last 168 hours BNP: BNP (last 3 results) No results found for this basename: PROBNP,  in the last 8760 hours CBG: No results found for this basename: GLUCAP,  in the last 168 hours     Signed:  Kimothy Kishimoto  [facility] 04/02/2012, 10:13 PM

## 2012-05-22 ENCOUNTER — Other Ambulatory Visit: Payer: Self-pay | Admitting: Internal Medicine

## 2012-05-22 DIAGNOSIS — E0500 Thyrotoxicosis with diffuse goiter without thyrotoxic crisis or storm: Secondary | ICD-10-CM

## 2012-05-22 DIAGNOSIS — E0590 Thyrotoxicosis, unspecified without thyrotoxic crisis or storm: Secondary | ICD-10-CM

## 2012-06-08 ENCOUNTER — Ambulatory Visit: Payer: Self-pay | Admitting: Internal Medicine

## 2012-06-08 ENCOUNTER — Encounter (HOSPITAL_COMMUNITY)
Admission: RE | Admit: 2012-06-08 | Discharge: 2012-06-08 | Disposition: A | Discharge status: Home / Self Care | Payer: BC Managed Care – PPO | Source: Ambulatory Visit | Attending: Internal Medicine | Admitting: Internal Medicine

## 2012-06-08 DIAGNOSIS — E0500 Thyrotoxicosis with diffuse goiter without thyrotoxic crisis or storm: Secondary | ICD-10-CM

## 2012-06-08 DIAGNOSIS — E0590 Thyrotoxicosis, unspecified without thyrotoxic crisis or storm: Secondary | ICD-10-CM

## 2012-06-09 ENCOUNTER — Encounter (HOSPITAL_COMMUNITY)
Admission: RE | Admit: 2012-06-09 | Discharge: 2012-06-09 | Disposition: A | Discharge status: Home / Self Care | Payer: BC Managed Care – PPO | Source: Ambulatory Visit | Attending: Internal Medicine | Admitting: Internal Medicine

## 2012-06-09 DIAGNOSIS — E0590 Thyrotoxicosis, unspecified without thyrotoxic crisis or storm: Secondary | ICD-10-CM | POA: Insufficient documentation

## 2012-06-30 ENCOUNTER — Encounter (HOSPITAL_COMMUNITY): Payer: BC Managed Care – PPO

## 2012-07-05 ENCOUNTER — Other Ambulatory Visit: Payer: Self-pay | Admitting: Internal Medicine

## 2012-07-05 DIAGNOSIS — E0590 Thyrotoxicosis, unspecified without thyrotoxic crisis or storm: Secondary | ICD-10-CM

## 2012-07-13 ENCOUNTER — Encounter (HOSPITAL_COMMUNITY): Admission: RE | Admit: 2012-07-13 | Payer: BC Managed Care – PPO | Source: Ambulatory Visit

## 2012-07-14 ENCOUNTER — Ambulatory Visit: Payer: Self-pay | Admitting: Internal Medicine

## 2012-07-27 ENCOUNTER — Encounter (HOSPITAL_COMMUNITY)
Admission: RE | Admit: 2012-07-27 | Discharge: 2012-07-27 | Disposition: A | Discharge status: Home / Self Care | Payer: BC Managed Care – PPO | Source: Ambulatory Visit | Attending: Internal Medicine | Admitting: Internal Medicine

## 2012-07-27 ENCOUNTER — Encounter (HOSPITAL_COMMUNITY): Payer: Self-pay

## 2012-07-27 DIAGNOSIS — E0590 Thyrotoxicosis, unspecified without thyrotoxic crisis or storm: Secondary | ICD-10-CM

## 2012-07-27 MED ORDER — SODIUM IODIDE I 131 CAPSULE
13.1000 | Freq: Once | INTRAVENOUS | Status: AC | PRN
  Administered 2012-07-27: 13.1 via ORAL

## 2012-08-30 ENCOUNTER — Telehealth: Payer: Self-pay | Admitting: *Deleted

## 2012-08-30 ENCOUNTER — Other Ambulatory Visit (INDEPENDENT_AMBULATORY_CARE_PROVIDER_SITE_OTHER): Payer: BC Managed Care – PPO

## 2012-08-30 ENCOUNTER — Ambulatory Visit (INDEPENDENT_AMBULATORY_CARE_PROVIDER_SITE_OTHER): Payer: BC Managed Care – PPO | Admitting: Internal Medicine

## 2012-08-30 ENCOUNTER — Encounter: Payer: Self-pay | Admitting: Internal Medicine

## 2012-08-30 VITALS — BP 122/84 | HR 62 | Temp 98.3°F | Wt 189.0 lb

## 2012-08-30 DIAGNOSIS — Z131 Encounter for screening for diabetes mellitus: Secondary | ICD-10-CM

## 2012-08-30 DIAGNOSIS — J45909 Unspecified asthma, uncomplicated: Secondary | ICD-10-CM | POA: Insufficient documentation

## 2012-08-30 DIAGNOSIS — M542 Cervicalgia: Secondary | ICD-10-CM | POA: Insufficient documentation

## 2012-08-30 DIAGNOSIS — E0590 Thyrotoxicosis, unspecified without thyrotoxic crisis or storm: Secondary | ICD-10-CM

## 2012-08-30 DIAGNOSIS — J3081 Allergic rhinitis due to animal (cat) (dog) hair and dander: Secondary | ICD-10-CM

## 2012-08-30 DIAGNOSIS — M129 Arthropathy, unspecified: Secondary | ICD-10-CM

## 2012-08-30 DIAGNOSIS — Z0000 Encounter for general adult medical examination without abnormal findings: Secondary | ICD-10-CM

## 2012-08-30 DIAGNOSIS — M1990 Unspecified osteoarthritis, unspecified site: Secondary | ICD-10-CM

## 2012-08-30 DIAGNOSIS — Z13220 Encounter for screening for lipoid disorders: Secondary | ICD-10-CM

## 2012-08-30 DIAGNOSIS — Z130 Encounter for screening for diseases of the blood and blood-forming organs and certain disorders involving the immune mechanism: Secondary | ICD-10-CM

## 2012-08-30 DIAGNOSIS — D649 Anemia, unspecified: Secondary | ICD-10-CM

## 2012-08-30 DIAGNOSIS — J45901 Unspecified asthma with (acute) exacerbation: Secondary | ICD-10-CM

## 2012-08-30 DIAGNOSIS — J4531 Mild persistent asthma with (acute) exacerbation: Secondary | ICD-10-CM

## 2012-08-30 LAB — LIPID PANEL
LDL Cholesterol: 121 mg/dL — ABNORMAL HIGH (ref 0–99)
Total CHOL/HDL Ratio: 3
Triglycerides: 56 mg/dL (ref 0.0–149.0)
VLDL: 11.2 mg/dL (ref 0.0–40.0)

## 2012-08-30 LAB — CBC
MCHC: 33.4 g/dL (ref 30.0–36.0)
MCV: 82 fl (ref 78.0–100.0)

## 2012-08-30 LAB — COMPREHENSIVE METABOLIC PANEL
AST: 19 U/L (ref 0–37)
Albumin: 4 g/dL (ref 3.5–5.2)
Alkaline Phosphatase: 64 U/L (ref 39–117)
BUN: 10 mg/dL (ref 6–23)
Potassium: 4.7 mEq/L (ref 3.5–5.1)
Sodium: 138 mEq/L (ref 135–145)
Total Bilirubin: 0.5 mg/dL (ref 0.3–1.2)
Total Protein: 7 g/dL (ref 6.0–8.3)

## 2012-08-30 LAB — SEDIMENTATION RATE: Sed Rate: 10 mm/hr (ref 0–22)

## 2012-08-30 MED ORDER — CYCLOBENZAPRINE HCL 10 MG PO TABS: 10.0000 mg | ORAL_TABLET | Freq: Three times a day (TID) | ORAL | Status: DC | PRN

## 2012-08-30 MED ORDER — HYDROCODONE-ACETAMINOPHEN 7.5-325 MG/15ML PO SOLN: 15.0000 mL | Freq: Four times a day (QID) | ORAL | Status: DC | PRN

## 2012-08-30 MED ORDER — ALBUTEROL SULFATE HFA 108 (90 BASE) MCG/ACT IN AERS: 2.0000 | INHALATION_SPRAY | Freq: Four times a day (QID) | RESPIRATORY_TRACT | Status: DC | PRN

## 2012-08-30 NOTE — Telephone Encounter (Signed)
Message copied by Nada Maclachlan on Wed Aug 30, 2012  4:37 PM ------      Message from: Lorre Munroe      Created: Wed Aug 30, 2012  4:19 PM       Please call pt and let her know that her TSH is .8 which is in normal limits. All other labs are normal or stable. I cant fine anything that is causing her neck and back pain at this time. Lets treat with the flexeril and vicodin. If symptoms persist after 3-4 weeks we can reevaluate. ------

## 2012-08-30 NOTE — Patient Instructions (Signed)

## 2012-08-30 NOTE — Assessment & Plan Note (Signed)
Will check CBC today.  

## 2012-08-30 NOTE — Assessment & Plan Note (Signed)
Will refer to allergy for additional testing per pt request

## 2012-08-30 NOTE — Progress Notes (Signed)
HPI  Pt presents to the clinic today to establish care. She recently moved from [place]. She did not have a PCP up there. She was recently diagnosed with asthma and hyperthyroidism in the hospital. She was being followed by [facility] endocrinology for that. She was started on Tapazole. She developed an allergic reaction to that. She decided to have radiation treatments. Since [facility] endocrinology closed, she plans to f/u with Dr. Everardo All at [place]. She does have some concerns about her asthma. She feels she is not adequeatly controlled. She is using her albuterol on a daily basis., She request referral to pulmonology as well as an allergist for allergy testing. She also c/o neck and back pain. She deescrbies the pain as burning. She has not had any specific injury to the area. Her endocrinologist told her this was likely related to her thyroid.  Flu: never Tetanus: within the last 10 years LMP: 08/27/2012 Pap smear: 2013 Dentist: as needed Past Medical History  Diagnosis Date  . Asthma   . Asthma     Current Outpatient Prescriptions  Medication Sig Dispense Refill  . albuterol (PROVENTIL HFA;VENTOLIN HFA) 108 (90 BASE) MCG/ACT inhaler Inhale 2 puffs into the lungs every 6 (six) hours as needed. Shortness of breath      . albuterol (PROVENTIL HFA;VENTOLIN HFA) 108 (90 BASE) MCG/ACT inhaler Inhale 2 puffs into the lungs every 4 (four) hours as needed for wheezing.  1 Inhaler  2  . cyclobenzaprine (FLEXERIL) 10 MG tablet Take 1 tablet (10 mg total) by mouth 3 (three) times daily as needed for muscle spasms.  30 tablet  0  . HYDROcodone-acetaminophen (HYCET) 7.5-325 mg/15 ml solution Take 15 mLs by mouth 4 (four) times daily as needed for pain.  120 mL  0  . loratadine (CLARITIN) 10 MG tablet Take 10 mg by mouth daily.      . methimazole (TAPAZOLE) 10 MG tablet Take 1 tablet (10 mg total) by mouth 2 (two) times daily with a meal.  60 tablet  0  . albuterol (PROVENTIL) (2.5 MG/3ML) 0.083% nebulizer solution  Take 3 mLs (2.5 mg total) by nebulization every 4 (four) hours as needed for wheezing.  75 mL  0  . diphenhydrAMINE (SOMINEX) 25 MG tablet Take 25 mg by mouth daily as needed.      . predniSONE (STERAPRED UNI-PAK) 10 MG tablet Take 1-4 tablets (10-40 mg total) by mouth daily. Take 40mg  for 2 days then 20mg  for 2 days then 10mg  for 2 days then STOP  14 tablet  0   No current facility-administered medications for this visit.    Allergies  Allergen Reactions  . Tapazole (Thiamazole) Hives    Family History  Problem Relation Age of Onset  . HIV Mother   . Diabetes Mellitus II Mother   . Hypertension Father     History   Social History  . Marital Status: Single    Spouse Name: N/A    Number of Children: N/A  . Years of Education: N/A   Occupational History  . Not on file.   Social History Main Topics  . Smoking status: Current Every Day Smoker -- 0.25 packs/day for 7 years  . Smokeless tobacco: Never Used  . Alcohol Use: Yes     Comment: occasional  . Drug Use: No  . Sexually Active: Yes   Other Topics Concern  . Not on file   Social History Narrative   ** Merged History Encounter **  ROS:  Constitutional: Denies fever, malaise, fatigue, headache or abrupt weight changes.  HEENT: Denies eye pain, eye redness, ear pain, ringing in the ears, wax buildup, runny nose, nasal congestion, bloody nose, or sore throat. Respiratory: Pt reports cough. Denies difficulty breathing, shortness of breath, or sputum production.   Cardiovascular: Denies chest pain, chest tightness, palpitations or swelling in the hands or feet.  Gastrointestinal: Pt reports occasional diarrhea. Denies abdominal pain, bloating, constipation, or blood in the stool.  GU: Denies frequency, urgency, pain with urination, blood in urine, odor or discharge. Musculoskeletal: Pt reports neck pain. Denies decrease in range of motion, difficulty with gait, or joint pain and swelling.  Skin: Denies redness,  rashes, lesions or ulcercations.  Neurological: Denies dizziness, difficulty with memory, difficulty with speech or problems with balance and coordination.   No other specific complaints in a complete review of systems (except as listed in HPI above).  PE:  BP 122/84  Pulse 62  Temp(Src) 98.3 F (36.8 C) (Oral)  Wt 189 lb (85.73 kg)  BMI 32.95 kg/m2  SpO2 96% Wt Readings from Last 3 Encounters:  08/30/12 189 lb (85.73 kg)  03/27/12 189 lb (85.73 kg)  03/21/12 189 lb 6 oz (85.9 kg)    General: Appearsher stated age,obese but well developed, well nourished in NAD. HEENT: Head: normal shape and size; Eyes: sclera white, no icterus, conjunctiva pink, PERRLA and EOMs intact; Ears: Tm's gray and intact, normal light reflex; Nose: mucosa pink and moist, septum midline; Throat/Mouth: Teeth present, mucosa pink and moist, no lesions or ulcerations noted.  Neck: Normal range of motion. Neck supple, trachea midline. No massses, lumps or thyromegaly present.  Cardiovascular: Normal rate and rhythm. S1,S2 noted.  No murmur, rubs or gallops noted. No JVD or BLE edema. No carotid bruits noted. Pulmonary/Chest: Normal effort and positive vesicular breath sounds. No respiratory distress. No wheezes, rales or ronchi noted.  Abdomen: Soft and nontender. Normal bowel sounds, no bruits noted. No distention or masses noted. Liver, spleen and kidneys non palpable. Musculoskeletal: Normal range of motion. No signs of joint swelling. No difficulty with gait. Tender to palpation of the trapezius. Neurological: Alert and oriented. Cranial nerves II-XII intact. Coordination normal. +DTRs bilaterally. Psychiatric: Mood and affect normal. Behavior is normal. Judgment and thought content normal.   EKG:  BMET    Component Value Date/Time   NA 135 03/22/2012 0455   K 3.7 03/22/2012 0455   CL 103 03/22/2012 0455   CO2 21 03/22/2012 0455   GLUCOSE 161* 03/22/2012 0455   BUN 12 03/22/2012 0455   CREATININE 0.35*  03/22/2012 0455   CALCIUM 8.9 03/22/2012 0455   GFRNONAA >90 03/22/2012 0455   GFRAA >90 03/22/2012 0455    Lipid Panel  No results found for this basename: chol, trig, hdl, cholhdl, vldl, ldlcalc    CBC    Component Value Date/Time   WBC 5.9 03/22/2012 0455   RBC 4.25 03/22/2012 0455   HGB 11.2* 03/22/2012 0455   HCT 33.9* 03/22/2012 0455   PLT 262 03/22/2012 0455   MCV 79.8 03/22/2012 0455   MCH 26.4 03/22/2012 0455   MCHC 33.0 03/22/2012 0455   RDW 13.2 03/22/2012 0455   LYMPHSABS 1.1 03/21/2012 2058   MONOABS 0.2 03/21/2012 2058   EOSABS 0.0 03/21/2012 2058   BASOSABS 0.0 03/21/2012 2058    Hgb A1C No results found for this basename: HGBA1C     Assessment and Plan:  Preventative Health:  Work on diet and exercise Will  obtain screening labs today Encouraged pt to visit dentist at least yearly

## 2012-08-30 NOTE — Telephone Encounter (Signed)
Refill done.  

## 2012-08-30 NOTE — Assessment & Plan Note (Signed)
MSK vs ? Fibromyalgia Will check ESR Will give flexeril and vicodin at this time

## 2012-08-30 NOTE — Assessment & Plan Note (Signed)
Will recheck TSH today Continue to follow with endocrinology

## 2012-08-30 NOTE — Assessment & Plan Note (Signed)
Will refer to pulmonology for further evaluation

## 2012-08-30 NOTE — Assessment & Plan Note (Signed)
Will check ESR

## 2012-09-07 ENCOUNTER — Other Ambulatory Visit: Payer: Self-pay

## 2012-09-07 DIAGNOSIS — R222 Localized swelling, mass and lump, trunk: Secondary | ICD-10-CM

## 2012-09-12 ENCOUNTER — Encounter: Payer: Self-pay | Admitting: Internal Medicine

## 2012-09-12 ENCOUNTER — Ambulatory Visit (INDEPENDENT_AMBULATORY_CARE_PROVIDER_SITE_OTHER): Payer: BC Managed Care – PPO | Admitting: Internal Medicine

## 2012-09-12 VITALS — BP 120/64 | HR 99 | Temp 98.1°F | Resp 12 | Ht 63.75 in | Wt 191.0 lb

## 2012-09-12 DIAGNOSIS — E0500 Thyrotoxicosis with diffuse goiter without thyrotoxic crisis or storm: Secondary | ICD-10-CM

## 2012-09-12 DIAGNOSIS — R222 Localized swelling, mass and lump, trunk: Secondary | ICD-10-CM

## 2012-09-12 DIAGNOSIS — J9859 Other diseases of mediastinum, not elsewhere classified: Secondary | ICD-10-CM

## 2012-09-12 DIAGNOSIS — E0590 Thyrotoxicosis, unspecified without thyrotoxic crisis or storm: Secondary | ICD-10-CM

## 2012-09-12 NOTE — Progress Notes (Addendum)
Patient ID: DEION FORGUE, female   DOB: 02-06-1976, 37 y.o.   MRN: [id number]   HPI  Lacey Mccarty is a 37 y.o.-year-old female, self-referred for evaluation for Graves Ds.   She was dx in 03/2012, when she was admitted for asthma exacerbation. A CT scan of her chest performed on 03/22/2012 showed a thymic mass. Dr. Edwina Barth was consulted and he suggested to check a TSH, which returned low. Retrospectively, she had heat intolerance x 2 mo; had fatigue; did not lose weight; no palpitations; no anxiety; no tremors. The decision was made to repeat the CT scan after resolution of Graves disease. She will have an appointment with Dr. Dorris Fetch at the end of this month.  I reviewed pt's thyroid tests: Lab Results  Component Value Date   TSH 0.80 08/30/2012   TSH 0.010* 03/22/2012   FREET4 3.12* 03/22/2012    Reviewed imaging test reports: - 03/22/2012 chest CT: 5.6 x 5.0 x 3.6 cm thymic mass, possible thymoma. Of note, her hCG and alpha-fetoprotein were not high - 06/09/2012: Thyroid uptake and scan 71.5% uptake, no nodules, pyramidal lobe present; uniform increased uptake suggesting Graves' disease - 07/27/2012: Radioactive iodine ablation with 13.1 mCi  Pt denies feeling nodules in neck, has some hoarseness, no dysphagia/odynophagia, + SOB with lying down - asthma; she c/o: - no excessive sweating/heat intolerance - no tremors - no anxiety - no palpitations - no problems with concentration - + fatigue - + loose bowels in am, sometimes more stools in a day - no weight loss  Pt does not have a FH of thyroid ds. No FH of autoimmune ds. No FH of thyroid cancer. No h/o radiation tx to head or neck.  I reviewed her chart and she also has a history of asthma.   ROS: Constitutional: no weight gain/loss, no fatigue, no subjective hyperthermia/hypothermia Eyes: no blurry vision, no xerophthalmia ENT: + sore throat, no nodules palpated in throat, no dysphagia/odynophagia, no  hoarseness Cardiovascular: no CP/+ SOB/palpitations/leg swelling Respiratory: no cough/+ SOB/ + wheezing Gastrointestinal: no N/V/D/C Musculoskeletal: no muscle/joint aches Skin: no rashes Neurological: no tremors/numbness/tingling/dizziness Psychiatric: no depression/anxiety Low libido  Past Medical History  Diagnosis Date  . Asthma   . Asthma    Past Surgical History  Procedure Laterality Date  . Liposuction    . Cesarean section     History   Social History  . Marital Status: Single    Spouse Name: N/A    Number of Children: 3   Occupational History  . school.   Social History Main Topics  . Smoking status: Current Every Day Smoker -- 0.25 packs/day for 7 years  . Smokeless tobacco: Never Used  . Alcohol Use: Yes     Comment: occasional  . Drug Use: No  . Sexually Active: Yes   Other Topics Concern  . Not on file   Social History Narrative   ** Merged History Encounter **      Regular exercise: no   Caffeine use: none   Current Outpatient Prescriptions on File Prior to Visit  Medication Sig Dispense Refill  . albuterol (PROVENTIL HFA;VENTOLIN HFA) 108 (90 BASE) MCG/ACT inhaler Inhale 2 puffs into the lungs every 6 (six) hours as needed. Shortness of breath  18 g  0  . albuterol (PROVENTIL HFA;VENTOLIN HFA) 108 (90 BASE) MCG/ACT inhaler Inhale 2 puffs into the lungs every 4 (four) hours as needed for wheezing.  1 Inhaler  2  . cyclobenzaprine (FLEXERIL) 10 MG  tablet Take 1 tablet (10 mg total) by mouth 3 (three) times daily as needed for muscle spasms.  30 tablet  0  . diphenhydrAMINE (SOMINEX) 25 MG tablet Take 25 mg by mouth daily as needed.      Marland Kitchen HYDROcodone-acetaminophen (HYCET) 7.5-325 mg/15 ml solution Take 15 mLs by mouth 4 (four) times daily as needed for pain.  120 mL  0  . loratadine (CLARITIN) 10 MG tablet Take 10 mg by mouth daily.      . methimazole (TAPAZOLE) 10 MG tablet Take 1 tablet (10 mg total) by mouth 2 (two) times daily with a meal.  60  tablet  0  . predniSONE (STERAPRED UNI-PAK) 10 MG tablet Take 1-4 tablets (10-40 mg total) by mouth daily. Take 40mg  for 2 days then 20mg  for 2 days then 10mg  for 2 days then STOP  14 tablet  0  . albuterol (PROVENTIL) (2.5 MG/3ML) 0.083% nebulizer solution Take 3 mLs (2.5 mg total) by nebulization every 4 (four) hours as needed for wheezing.  75 mL  0   No current facility-administered medications on file prior to visit.   Allergies  Allergen Reactions  . Tapazole (Thiamazole) Hives   Family History  Problem Relation Age of Onset  . HIV Mother   . Diabetes Mellitus II Mother   . Hypertension Father    PE: BP 120/64  Pulse 99  Temp(Src) 98.1 F (36.7 C) (Oral)  Resp 12  Ht 5' 3.75" (1.619 m)  Wt 191 lb (86.637 kg)  BMI 33.05 kg/m2  SpO2 98% Wt Readings from Last 3 Encounters:  09/12/12 191 lb (86.637 kg)  08/30/12 189 lb (85.73 kg)  03/27/12 189 lb (85.73 kg)   Constitutional: overweight, in NAD Eyes: PERRLA, EOMI, no exophthalmos, no lid lag, no stare ENT: moist mucous membranes, no thyromegaly, no thyroid bruits, no cervical lymphadenopathy Cardiovascular: RRR, No MRG Respiratory: CTA B Gastrointestinal: abdomen soft, NT, ND, BS+ Musculoskeletal: no deformities, strength intact in all 4 Skin: moist, warm, no rashes Neurological: no tremor with outstretched hands, DTR normal in all 4  ASSESSMENT: 1. Graves ds  2. Mediastinal mass  PLAN:  1. Patient with Luiz Blare' disease, diagnosed 5 months ago, initially tried on methimazole, which she could not tolerate due to hives. She had radioactive iodine ablation on 07/27/2012. Her TSH normalized, and 0.8, per the last check on 08/30/2012. - I would like to repeat a TSH, but also a free T4 and free T3 today. I will add TSI antibodies. - we discussed about the fact that her thyroid tests might remain normal after the radioactive iodine ablation, however, she might become hypothyroid, and we will need to keep a close eye on her  thyroid tests from now on - I advised her to join my chart to communicate easier - RTC in 3 months for a visit but in 6 weeks for repeat labs  2. Mediastinal mass - This was discovered on a CT that was checked in the setting of asthma exacerbation, and, fortunately, Graves ds was suspected and any intervention was held until her Graves disease is resolved. Thymic masses can appear in the setting of Graves' disease, and they most always decrease in size after Graves treatment. Patient has a repeat the CT scan at the end of this month. I discussed with the patient that the CT might need to be postponed until 11/2012, since she only had the radioactive iodine ablation at the end of June. I advised her to discuss with  Dr. Dorris Fetch and see if she needs to reschedule the CT scan.  Office Visit on 09/12/2012  Component Date Value Range Status  . TSH 09/12/2012 1.55  0.35 - 5.50 uIU/mL Final  . Free T4 09/12/2012 0.59* 0.60 - 1.60 ng/dL Final  . T3, Free 45/40/9811 2.3  2.3 - 4.2 pg/mL Final  TSI's 28 (<140%).  Will repeat labs in 6 weeks, as planned.

## 2012-09-12 NOTE — Patient Instructions (Signed)
Please return in 6 weeks for labs and in 3 months for an appointment. Please stop at the labs now, too. Please join MyChart.

## 2012-09-13 ENCOUNTER — Other Ambulatory Visit: Payer: Self-pay | Admitting: Internal Medicine

## 2012-09-13 ENCOUNTER — Telehealth: Payer: Self-pay | Admitting: *Deleted

## 2012-09-13 MED ORDER — HYDROCODONE-ACETAMINOPHEN 7.5-325 MG PO TABS: 1.0000 | ORAL_TABLET | Freq: Four times a day (QID) | ORAL | Status: DC | PRN

## 2012-09-13 NOTE — Telephone Encounter (Signed)
Pt called states the Hycet makes her nauseated.  She is requesting pill form.  Please advise

## 2012-09-13 NOTE — Telephone Encounter (Signed)
Spoke with pt advised Rx has been sent.  

## 2012-09-13 NOTE — Telephone Encounter (Signed)
RX done. 

## 2012-09-14 ENCOUNTER — Telehealth: Payer: Self-pay | Admitting: *Deleted

## 2012-09-14 LAB — THYROID STIMULATING IMMUNOGLOBULIN: TSI: 28 % baseline (ref <140)

## 2012-09-14 NOTE — Telephone Encounter (Signed)
Called pt and advised her per Dr Elvera Lennox of her TFT, they were normal. I read her the values. Advised her to return in 6 weeks for labs and Dr Elvera Lennox will decide when she needs a return visit. Pt understood.

## 2012-09-20 ENCOUNTER — Ambulatory Visit: Payer: BC Managed Care – PPO | Admitting: Endocrinology

## 2012-10-03 ENCOUNTER — Other Ambulatory Visit: Payer: BC Managed Care – PPO

## 2012-10-03 ENCOUNTER — Ambulatory Visit
Admission: RE | Admit: 2012-10-03 | Discharge: 2012-10-03 | Disposition: A | Discharge status: Home / Self Care | Payer: BC Managed Care – PPO | Source: Ambulatory Visit | Attending: Thoracic Surgery (Cardiothoracic Vascular Surgery) | Admitting: Thoracic Surgery (Cardiothoracic Vascular Surgery)

## 2012-10-03 ENCOUNTER — Ambulatory Visit (INDEPENDENT_AMBULATORY_CARE_PROVIDER_SITE_OTHER): Payer: BC Managed Care – PPO | Admitting: Thoracic Surgery (Cardiothoracic Vascular Surgery)

## 2012-10-03 ENCOUNTER — Encounter: Payer: Self-pay | Admitting: Thoracic Surgery (Cardiothoracic Vascular Surgery)

## 2012-10-03 VITALS — BP 114/86 | HR 85 | Resp 18 | Ht 63.5 in | Wt 192.0 lb

## 2012-10-03 DIAGNOSIS — R222 Localized swelling, mass and lump, trunk: Secondary | ICD-10-CM

## 2012-10-03 DIAGNOSIS — J9859 Other diseases of mediastinum, not elsewhere classified: Secondary | ICD-10-CM

## 2012-10-03 NOTE — Progress Notes (Signed)
HPI:  [name] returns today for followup of her anterior mediastinal mass. She is a 37 year old woman with a history of asthma. She was admitted to [facility] in February with an acute asthma attack. A CT of the chest was done to rule out pulmonary embolus. It showed an anterior mediastinal mass. This had an appearance consistent with a hyperplastic thymus. She turned out to be hyperthyroid. Beta hCG was negative and alpha-fetoprotein levels were normal. She was started on methimazole time of discharge. She was treated with radioactive iodine in June.  She says that she has felt better since she was given the radioactive iodine. Apparently her thyroid function tests have returned to normal since then. She's not had any recent exacerbations of her asthma, shortness of breath, or difficulty swallowing. Her weight has been stable.   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 albuterol (PROVENTIL HFA;VENTOLIN HFA) 108 (90 BASE) MCG/ACT inhaler Inhale 2 puffs into the lungs every 6 (six) hours as needed. Shortness of breath  18 g  0  . albuterol (PROVENTIL) (2.5 MG/3ML) 0.083% nebulizer solution Take 3 mLs (2.5 mg total) by nebulization every 4 (four) hours as needed for wheezing.  75 mL  0  . cyclobenzaprine (FLEXERIL) 10 MG tablet Take 1 tablet (10 mg total) by mouth 3 (three) times daily as needed for muscle spasms.  30 tablet  0  . HYDROcodone-acetaminophen (NORCO) 7.5-325 MG per tablet Take 1 tablet by mouth every 6 (six) hours as needed for pain.  30 tablet  0  . loratadine (CLARITIN) 10 MG tablet Take 10 mg by mouth daily.       No current facility-administered medications for this visit.    Physical Exam BP 114/86  Pulse 85  Resp 18  Ht 5' 3.5" (1.613 m)  Wt 192 lb (87.091 kg)  BMI 33.47 kg/m2  SpO2 98%  LMP  10/03/2012 37 year old woman in no acute distress Overweight otherwise well-developed and well-nourished No palpable cervical or supraclavicular mass  Diagnostic Tests: CT of chest 10/03/12 *RADIOLOGY REPORT*  Clinical Data: Follow up anterior mediastinal mass. Recent  radioactive iodine therapy for hyperthyroidism.  CT CHEST WITHOUT CONTRAST  Technique: Multidetector CT imaging of the chest was performed  following the standard protocol without IV contrast.  Comparison: 03/22/2012.  Findings: Lungs are clear. No suspicious pulmonary nodules. No  pleural effusion or pneumothorax.  3.5 x 2.6 cm well circumscribed anterior mediastinal mass (series  3/image 12), previously 4.6 x 4.3 cm when measured in a similar  fashion.  The heart is normal in size. No pericardial effusion.  No suspicious mediastinal or hilar lymphadenopathy. Small  bilateral axillary lymph nodes which do not meet pathologic CT size  criteria.  Visualized upper abdomen is unremarkable.  Visualized osseous structures are within normal limits.  IMPRESSION:  3.5 cm well-circumscribed anterior mediastinal mass, decreased.  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  Original Report Authenticated By: Charline Bills, M.D.  Impression: 37 year old woman with anterior mediastinal mass. 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 It has improved since she's been treated. It has not totally resolved as of yet and I do think it would be wise to get another CT in about 6 months just make sure that there is no residual underlying thymoma.   Plan:  Repeat CT of chest in 6 months.

## 2012-10-11 ENCOUNTER — Other Ambulatory Visit: Payer: Self-pay | Admitting: Internal Medicine

## 2012-10-11 ENCOUNTER — Encounter: Payer: Self-pay | Admitting: Internal Medicine

## 2012-10-11 ENCOUNTER — Ambulatory Visit (INDEPENDENT_AMBULATORY_CARE_PROVIDER_SITE_OTHER)
Admission: RE | Admit: 2012-10-11 | Discharge: 2012-10-11 | Disposition: A | Discharge status: Home / Self Care | Payer: BC Managed Care – PPO | Source: Ambulatory Visit | Attending: Internal Medicine | Admitting: Internal Medicine

## 2012-10-11 ENCOUNTER — Ambulatory Visit (INDEPENDENT_AMBULATORY_CARE_PROVIDER_SITE_OTHER): Payer: BC Managed Care – PPO | Admitting: Internal Medicine

## 2012-10-11 ENCOUNTER — Telehealth: Payer: Self-pay | Admitting: *Deleted

## 2012-10-11 ENCOUNTER — Ambulatory Visit: Payer: BC Managed Care – PPO | Admitting: Internal Medicine

## 2012-10-11 VITALS — BP 140/92 | HR 87 | Temp 98.5°F | Wt 197.6 lb

## 2012-10-11 DIAGNOSIS — M545 Low back pain, unspecified: Secondary | ICD-10-CM

## 2012-10-11 DIAGNOSIS — G8929 Other chronic pain: Secondary | ICD-10-CM

## 2012-10-11 DIAGNOSIS — E890 Postprocedural hypothyroidism: Secondary | ICD-10-CM | POA: Insufficient documentation

## 2012-10-11 MED ORDER — PREGABALIN 50 MG PO CAPS: 50.0000 mg | ORAL_CAPSULE | Freq: Three times a day (TID) | ORAL | Status: DC

## 2012-10-11 MED ORDER — CYCLOBENZAPRINE HCL 10 MG PO TABS: 10.0000 mg | ORAL_TABLET | Freq: Three times a day (TID) | ORAL | Status: DC | PRN

## 2012-10-11 MED ORDER — LORATADINE 10 MG PO TABS: 10.0000 mg | ORAL_TABLET | Freq: Every day | ORAL | Status: DC

## 2012-10-11 MED ORDER — LEVOTHYROXINE SODIUM 75 MCG PO TABS: 75.0000 ug | ORAL_TABLET | Freq: Every day | ORAL | Status: DC

## 2012-10-11 NOTE — Telephone Encounter (Signed)
Start Levothyroxine 75 mcg daily - correct intake of thyroid hormones: separated by >30 min from breakfast, and >4h from PPI, calcium, iron, MVI. I will call in.

## 2012-10-11 NOTE — Telephone Encounter (Signed)
Labs were faxed in. TSH is 36.20 (repeated to verify the results).

## 2012-10-11 NOTE — Patient Instructions (Addendum)
It was good to see you today. We have reviewed your prior records including labs and tests today Use Lyrica for your back pain Test(s) ordered today. Your results will be released to MyChart (or called to you) after review, usually within 72hours after test completion. If any changes need to be made, you will be notified at that same time. we'll make referral to spine and back specialist. Our office will contact you regarding appointment(s) once made.   Back Exercises Back exercises help treat and prevent back injuries. The goal of back exercises is to increase the strength of your abdominal and back muscles and the flexibility of your back. These exercises should be started when you no longer have back pain. Back exercises include:  Pelvic Tilt. Lie on your back with your knees bent. Tilt your pelvis until the lower part of your back is against the floor. Hold this position 5 to 10 sec and repeat 5 to 10 times.  Knee to Chest. Pull first 1 knee up against your chest and hold for 20 to 30 seconds, repeat this with the other knee, and then both knees. This may be done with the other leg straight or bent, whichever feels better.  Sit-Ups or Curl-Ups. Bend your knees 90 degrees. Start with tilting your pelvis, and do a partial, slow sit-up, lifting your trunk only 30 to 45 degrees off the floor. Take at least 2 to 3 seconds for each sit-up. Do not do sit-ups with your knees out straight. If partial sit-ups are difficult, simply do the above but with only tightening your abdominal muscles and holding it as directed.  Hip-Lift. Lie on your back with your knees flexed 90 degrees. Push down with your feet and shoulders as you raise your hips a couple inches off the floor; hold for 10 seconds, repeat 5 to 10 times.  Back arches. Lie on your stomach, propping yourself up on bent elbows. Slowly press on your hands, causing an arch in your low back. Repeat 3 to 5 times. Any initial stiffness and discomfort  should lessen with repetition over time.  Shoulder-Lifts. Lie face down with arms beside your body. Keep hips and torso pressed to floor as you slowly lift your head and shoulders off the floor. Do not overdo your exercises, especially in the beginning. Exercises may cause you some mild back discomfort which lasts for a few minutes; however, if the pain is more severe, or lasts for more than 15 minutes, do not continue exercises until you see your caregiver. Improvement with exercise therapy for back problems is slow.  See your caregivers for assistance with developing a proper back exercise program. Document Released: 03/04/2004 Document Revised: 04/19/2011 Document Reviewed: 11/26/2010 [facility] Patient Information 2014 [place], [place].

## 2012-10-11 NOTE — Progress Notes (Signed)
  Subjective:    Patient ID: Lacey Mccarty, female    DOB: 03-09-1975, 37 y.o.   MRN: [id number]  HPI Comments: "Since Norco 7.5 makes me feel weird, you need to put me back on Percocet like before."  Back Pain This is a chronic problem. The current episode started more than 1 year ago. The problem occurs constantly. The problem has been waxing and waning since onset. The pain is present in the lumbar spine. The quality of the pain is described as aching, burning, shooting and stabbing. The pain does not radiate. The pain is severe. The pain is the same all the time. The symptoms are aggravated by bending, sitting, standing, twisting and position. Stiffness is present all day. Associated symptoms include tingling. Pertinent negatives include no chest pain, dysuria, fever, headaches, leg pain, numbness, weakness or weight loss. Risk factors include lack of exercise, obesity and sedentary lifestyle. She has tried analgesics and muscle relaxant for the symptoms. The treatment provided mild relief.   Past Medical History  Diagnosis Date  . Asthma   . Asthma      Review of Systems  Constitutional: Negative for fever and weight loss.  Cardiovascular: Negative for chest pain.  Genitourinary: Negative for dysuria.  Musculoskeletal: Positive for back pain.  Neurological: Positive for tingling. Negative for weakness, numbness and headaches.       Objective:   Physical Exam  BP 140/92  Pulse 87  Temp(Src) 98.5 F (36.9 C) (Oral)  Wt 197 lb 9.6 oz (89.631 kg)  BMI 34.45 kg/m2  SpO2 98%  LMP 10/03/2012 Wt Readings from Last 3 Encounters:  10/11/12 197 lb 9.6 oz (89.631 kg)  10/03/12 192 lb (87.091 kg)  09/12/12 191 lb (86.637 kg)   Constitutional: She is obese, but appears well-developed and well-nourished. No distress.  Cardiovascular: Normal rate, regular rhythm and normal heart sounds.  No murmur heard. No BLE edema. Pulmonary/Chest: Effort normal and breath sounds normal. No  respiratory distress. She has no wheezes.  Musculoskeletal: Back: full range of motion of thoracic and lumbar spine. Non tender to palpation. Negative straight leg raise. DTR's are symmetrically intact. Sensation intact in all dermatomes of the lower extremities. Full strength to manual muscle testing. patient is able to heel toe walk without difficulty and ambulates with antalgic gait. Neurological: She is alert and oriented to person, place, and time. No cranial nerve deficit. Coordination, balance, strength, speech and gait are normal.  Skin: Skin is warm and dry. No rash noted. No erythema.  Psychiatric: She has demanding affect, angry mood. Her behavior is min aggressive. Judgment and thought content appear normal.       Assessment & Plan:   Chronic lumbar back pain, neuro and mskel exam intact.   Mild improvement with muscle relaxer, ineffective relief with narcotics.   I explained to patient lack of efficacy in relieving chronic back pain with high-dose chronic narcotics and decline to provide prescription for oxycodone as requested.   Given neuropathic component with burning and tingling, I offered Lyrica 3 times daily which patient reports she will not fill.   Continue muscle relaxers as needed, refills provided today.  Also check plain film and refer to specialist for evaluation of chronic issue.   Back exercises provided.   Patient left office visit clearly unhappy with advice and plan

## 2012-10-11 NOTE — Telephone Encounter (Signed)
Pt called stating that she had lab work done at her [facility] [employer]) and her TSH was 36.20 according to her physician. She said the physician stated this is urgent and needs to be dealt with immediately. Pt has an appt on 9/5 but wants to know if she needs to begin synthroid immediately. She is concerned b/c her physician stated she needed something immediately. I have called Medical Records at [facility] and lvm requesting to have those labs faxed to our office. Please advise.

## 2012-10-12 ENCOUNTER — Telehealth: Payer: Self-pay | Admitting: *Deleted

## 2012-10-12 NOTE — Telephone Encounter (Signed)
Called pt and advised her to start Levothyroxine 75 mcg daily - correct intake of thyroid hormones: separated by >30 min from breakfast, and >4h from PPI, calcium, iron, MVI. I will call in. Pt questioned what was this medication. Educated her that this is a hormone for her thyroid. She understood. Advised pt we would see her at her appt tomorrow.

## 2012-10-13 ENCOUNTER — Ambulatory Visit: Payer: BC Managed Care – PPO | Admitting: Internal Medicine

## 2012-10-13 ENCOUNTER — Encounter: Payer: Self-pay | Admitting: Internal Medicine

## 2012-10-13 ENCOUNTER — Ambulatory Visit (INDEPENDENT_AMBULATORY_CARE_PROVIDER_SITE_OTHER): Payer: BC Managed Care – PPO | Admitting: Internal Medicine

## 2012-10-13 VITALS — BP 152/90 | HR 79 | Temp 98.6°F | Wt 198.0 lb

## 2012-10-13 DIAGNOSIS — M5430 Sciatica, unspecified side: Secondary | ICD-10-CM

## 2012-10-13 DIAGNOSIS — G8929 Other chronic pain: Secondary | ICD-10-CM

## 2012-10-13 DIAGNOSIS — M545 Low back pain, unspecified: Secondary | ICD-10-CM

## 2012-10-13 DIAGNOSIS — M5432 Sciatica, left side: Secondary | ICD-10-CM

## 2012-10-13 MED ORDER — PREDNISONE 10 MG PO TABS: ORAL_TABLET | ORAL | Status: DC

## 2012-10-13 MED ORDER — KETOROLAC TROMETHAMINE 30 MG/ML IJ SOLN
30.0000 mg | Freq: Once | INTRAMUSCULAR | Status: AC
  Administered 2012-10-13: 30 mg via INTRAMUSCULAR

## 2012-10-13 MED ORDER — METHYLPREDNISOLONE ACETATE 80 MG/ML IJ SUSP
80.0000 mg | Freq: Once | INTRAMUSCULAR | Status: AC
  Administered 2012-10-13: 80 mg via INTRAMUSCULAR

## 2012-10-13 NOTE — Patient Instructions (Signed)

## 2012-10-13 NOTE — Progress Notes (Signed)
Subjective:    Patient ID: Lacey Mccarty, female    DOB: 11-16-1975, 37 y.o.   MRN: [id number]  HPI  Pt presents to the clinic today with c/o back pain. This is a recurrent issue for her. She has been on flexeril and vicodin. She saw Dr. Felicity Coyer on 9/3 for the same complaint and reports that the vicodin was not effective and that she wanted to be put back on the percocet. Dr. Felicity Coyer decline to refill percocet and vicodin with education saying narcotic do not improve back pain significantly. She offered to put her on lyrica but pt declined. Xray of the lumbar spine was negative and pt was referred to back specialist. She is not happy with this treatment plan. She wants some type of pain medication. She reports her pain is 9/10 in her lower back shooting down her left thigh. Nothing OTC helps relieve the pain.  Review of Systems      Past Medical History  Diagnosis Date  . Asthma   . Graves disease     I-131 ablation late 07/2012  . Chronic back pain     Current Outpatient Prescriptions  Medication Sig Dispense Refill  . albuterol (PROVENTIL HFA;VENTOLIN HFA) 108 (90 BASE) MCG/ACT inhaler Inhale 2 puffs into the lungs every 6 (six) hours as needed. Shortness of breath  18 g  0  . cyclobenzaprine (FLEXERIL) 10 MG tablet Take 1 tablet (10 mg total) by mouth 3 (three) times daily as needed for muscle spasms.  30 tablet  0  . levothyroxine (SYNTHROID, LEVOTHROID) 75 MCG tablet Take 1 tablet (75 mcg total) by mouth daily before breakfast.  60 tablet  3  . loratadine (CLARITIN) 10 MG tablet Take 1 tablet (10 mg total) by mouth daily.  30 tablet  3  . pregabalin (LYRICA) 50 MG capsule Take 1 capsule (50 mg total) by mouth 3 (three) times daily.  90 capsule  1   No current facility-administered medications for this visit.    Allergies  Allergen Reactions  . Tapazole [Thiamazole] Hives    Family History  Problem Relation Age of Onset  . HIV Mother   . Diabetes Mellitus II Mother   .  Hypertension Father     History   Social History  . Marital Status: Single    Spouse Name: N/A    Number of Children: N/A  . Years of Education: N/A   Occupational History  . Not on file.   Social History Main Topics  . Smoking status: Current Every Day Smoker -- 0.25 packs/day for 7 years  . Smokeless tobacco: Never Used  . Alcohol Use: Yes     Comment: occasional  . Drug Use: No  . Sexual Activity: Yes   Other Topics Concern  . Not on file   Social History Narrative   ** Merged History Encounter **      Regular exercise: no   Caffeine use: none         Constitutional: Denies fever, malaise, fatigue, headache or abrupt weight changes.  Musculoskeletal: Pt reports back pain/ Denies decrease in range of motion, difficulty with gait, or joint pain and swelling.  Neurological: Pt reports sharp shooting pain down her left thigh.Denies dizziness, difficulty with memory, difficulty with speech or problems with balance and coordination.   No other specific complaints in a complete review of systems (except as listed in HPI above).  Objective:   Physical Exam   BP 152/90  Pulse 79  Temp(Src)  98.6 F (37 C) (Oral)  Wt 198 lb (89.812 kg)  BMI 34.52 kg/m2  SpO2 98%  LMP 10/03/2012 Wt Readings from Last 3 Encounters:  10/13/12 198 lb (89.812 kg)  10/11/12 197 lb 9.6 oz (89.631 kg)  10/03/12 192 lb (87.091 kg)    General: Appears her stated age, well developed, well nourished in NAD. Cardiovascular: Normal rate and rhythm. S1,S2 noted.  No murmur, rubs or gallops noted. No JVD or BLE edema. No carotid bruits noted. Pulmonary/Chest: Normal effort and positive vesicular breath sounds. No respiratory distress. No wheezes, rales or ronchi noted.  Abdomen: Soft and nontender. Normal bowel sounds, no bruits noted. No distention or masses noted. Liver, spleen and kidneys non palpable. Musculoskeletal: Normal range of motion. No signs of joint swelling. No difficulty with  gait. Spine nontender to palpation. Neurological: Alert and oriented. Cranial nerves II-XII intact. Coordination normal. +DTRs bilaterally. Negative straight leg raise.   BMET    Component Value Date/Time   NA 138 08/30/2012 1521   K 4.7 08/30/2012 1521   CL 104 08/30/2012 1521   CO2 27 08/30/2012 1521   GLUCOSE 81 08/30/2012 1521   BUN 10 08/30/2012 1521   CREATININE 0.7 08/30/2012 1521   CALCIUM 9.3 08/30/2012 1521   GFRNONAA >90 03/22/2012 0455   GFRAA >90 03/22/2012 0455    Lipid Panel     Component Value Date/Time   CHOL 195 08/30/2012 1521   TRIG 56.0 08/30/2012 1521   HDL 62.60 08/30/2012 1521   CHOLHDL 3 08/30/2012 1521   VLDL 11.2 08/30/2012 1521   LDLCALC 121* 08/30/2012 1521    CBC    Component Value Date/Time   WBC 4.5 08/30/2012 1521   RBC 4.46 08/30/2012 1521   HGB 12.2 08/30/2012 1521   HCT 36.6 08/30/2012 1521   PLT 264.0 08/30/2012 1521   MCV 82.0 08/30/2012 1521   MCH 26.4 03/22/2012 0455   MCHC 33.4 08/30/2012 1521   RDW 14.7* 08/30/2012 1521   LYMPHSABS 1.1 03/21/2012 2058   MONOABS 0.2 03/21/2012 2058   EOSABS 0.0 03/21/2012 2058   BASOSABS 0.0 03/21/2012 2058    Hgb A1C Lab Results  Component Value Date   HGBA1C 5.4 08/30/2012        Assessment & Plan:   Chronic low back pain with radicular symptoms on the left:  Xray reviewed-neg Pt already referred to ortho back I decline to refill pt narcotics eRx for pred taper 80 mg Depo IM today and 30 mg toradol  RTC as needed or if symptoms persist or worsen

## 2012-10-13 NOTE — Addendum Note (Signed)
Addended by: Darnell Level on: 10/13/2012 01:38 PM   Modules accepted: Orders

## 2012-10-19 ENCOUNTER — Encounter: Payer: Self-pay | Admitting: Internal Medicine

## 2012-10-19 ENCOUNTER — Ambulatory Visit (INDEPENDENT_AMBULATORY_CARE_PROVIDER_SITE_OTHER): Payer: BC Managed Care – PPO | Admitting: Internal Medicine

## 2012-10-19 VITALS — BP 118/80 | HR 84 | Temp 99.1°F | Resp 10 | Ht 63.5 in | Wt 196.0 lb

## 2012-10-19 DIAGNOSIS — E0500 Thyrotoxicosis with diffuse goiter without thyrotoxic crisis or storm: Secondary | ICD-10-CM

## 2012-10-19 DIAGNOSIS — E890 Postprocedural hypothyroidism: Secondary | ICD-10-CM

## 2012-10-19 NOTE — Progress Notes (Signed)
Patient ID: Lacey Mccarty, female   DOB: December 01, 1975, 37 y.o.   MRN: [id number]   HPI  Lacey Mccarty is a 37 y.o.-year-old female, returning for followup for Graves Ds, dx 03/2012, now with iatrogenic hypothyroidism.  Reviewed hx: She was dx in 03/2012, when she was admitted for asthma exacerbation. A CT scan of her chest performed on 03/22/2012 showed a thymic mass. Dr. Edwina Barth was consulted and he suggested to check a TSH, which returned low. Retrospectively, she had heat intolerance x 2 mo; had fatigue; did not lose weight; no palpitations; no anxiety; no tremors. The decision was made to repeat the CT scan after resolution of Graves disease. She will have an appointment with Dr. Dorris Fetch at the end of this month.  I reviewed pt's thyroid tests: Latest TSH obtained outside, was: 36 on 09/01 - we received records and called pt to start levothyroxine 75 mcg daily - advised pt to take Levothyroxine EVERY DAY, with water, >30 min before b'fast. She is not on antiacid medication, calcium, iron, MVI.  Lab Results  Component Value Date   TSH 1.55 09/12/2012   TSH 0.80 08/30/2012   TSH 0.010* 03/22/2012   FREET4 0.59* 09/12/2012   FREET4 3.12* 03/22/2012  TSI 28  Reviewed imaging test reports: - 03/22/2012 chest CT: 5.6 x 5.0 x 3.6 cm thymic mass, possible thymoma. Of note, her hCG and alpha-fetoprotein were not high - 06/09/2012: Thyroid uptake and scan 71.5% uptake, no nodules, pyramidal lobe present; uniform increased uptake suggesting Graves' disease - 07/27/2012: Radioactive iodine ablation with 13.1 mCi - 10/03/2012: Chest CT: 3.5 x 2.6 cm well circumscribed anterior mediastinal mass, previously 4.6 x 4.3 cm when measured in a similar fashion.   She c/o: HAs, bleeding, weight gain Now: -  Bleeding stopped on OCPs added 1 week ago - weight decreased by 2 lbs after starting Levothyroxine - + back pain  - no excessive sweating/heat intolerance - no tremors - no anxiety - no  palpitations - no problems with concentration - + fatigue - + blurry vision  ROS: Constitutional: no weight gain/loss, no fatigue, no subjective hyperthermia/hypothermia Eyes: no blurry vision, no xerophthalmia ENT: + sore throat, no nodules palpated in throat, no dysphagia/odynophagia, no hoarseness Cardiovascular: no CP/+ SOB/palpitations/leg swelling Respiratory: no cough/+ SOB/ + wheezing Gastrointestinal: no N/V/D/C Musculoskeletal: no muscle/joint aches Skin: no rashes Neurological: no tremors/numbness/tingling/dizziness  I reviewed pt's medications, allergies, PMH, social hx, family hx and no changes required, except as mentioned above.  PE: BP 118/80  Pulse 84  Temp(Src) 99.1 F (37.3 C) (Oral)  Resp 10  Ht 5' 3.5" (1.613 m)  Wt 196 lb (88.905 kg)  BMI 34.17 kg/m2  SpO2 96%  LMP 10/03/2012 Wt Readings from Last 3 Encounters:  10/19/12 196 lb (88.905 kg)  10/13/12 198 lb (89.812 kg)  10/11/12 197 lb 9.6 oz (89.631 kg)   Constitutional: overweight, in NAD Eyes: PERRLA, EOMI, no exophthalmos, no lid lag, no stare ENT: moist mucous membranes, no thyromegaly, no thyroid bruits, no cervical lymphadenopathy Cardiovascular: RRR, No MRG Respiratory: CTA B Gastrointestinal: abdomen soft, NT, ND, BS+ Musculoskeletal: no deformities, strength intact in all 4 Skin: moist, warm, no rashes Neurological: no tremor with outstretched hands, DTR normal in all 4  ASSESSMENT: 1. Graves ds - now with iatrogenic hypothyroidism - Started levothyroxine 75  2. 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 Patient with Luiz Blare' disease, diagnosed 7 months ago, initially tried on  methimazole, which she could not tolerate due to hives. She had radioactive iodine ablation on 07/27/2012. Her TSH normalized, but then quickly became elevated, at 36, per records from [place]. Pt was started on Levothyroxine 75, which she takes correctly.   - she started the Levothyroxine 10 days ago >> will need to check tests in 6-8 weeks after starting - she will return for a lab appt then and for an appt with me in 4 mo - I advised her to join my chart to communicate easier  2. Mediastinal mass - This was discovered on a CT that was checked in the setting of asthma exacerbation, and, fortunately, Graves ds was suspected and any intervention was held until her Graves disease would be resolved. Indeed, after Graves resolution, the size of the mass decreased (CT from 10/03/2012) - HPI, confirming Thymic hyperplasia

## 2012-10-19 NOTE — Patient Instructions (Addendum)
Please return in 6 weeks for labs and in 4 months for an appointment with me.

## 2012-10-24 ENCOUNTER — Institutional Professional Consult (permissible substitution): Payer: BC Managed Care – PPO | Admitting: Internal Medicine

## 2012-11-30 ENCOUNTER — Other Ambulatory Visit: Payer: BC Managed Care – PPO

## 2012-12-14 ENCOUNTER — Other Ambulatory Visit: Payer: BC Managed Care – PPO

## 2012-12-14 ENCOUNTER — Encounter: Payer: Self-pay | Admitting: Internal Medicine

## 2012-12-14 ENCOUNTER — Ambulatory Visit (INDEPENDENT_AMBULATORY_CARE_PROVIDER_SITE_OTHER): Payer: BC Managed Care – PPO | Admitting: Internal Medicine

## 2012-12-14 ENCOUNTER — Encounter (INDEPENDENT_AMBULATORY_CARE_PROVIDER_SITE_OTHER): Payer: Self-pay

## 2012-12-14 VITALS — BP 120/72 | HR 75 | Ht 64.0 in | Wt 206.0 lb

## 2012-12-14 DIAGNOSIS — J3081 Allergic rhinitis due to animal (cat) (dog) hair and dander: Secondary | ICD-10-CM

## 2012-12-14 DIAGNOSIS — J309 Allergic rhinitis, unspecified: Secondary | ICD-10-CM

## 2012-12-14 NOTE — Progress Notes (Signed)
12/14/12- 36 yoF former smoker referred courtesy of Nicki Reaper, NP; allergic to dogs-symptoms start quickly when around any dogs. Declines flu shot. Had smoked one half pack per day for 7 years She complains of sneezing, runny nose, itching of eyes, mainly around dogs. House dust and freshly cut grass can also trigger. Adult onset. Has had asthma. Recent trial of Allegra and Claritin not very effective. Has not tried nasal sprays Environment: Lives with children and dog, works at school. Family-father died of cancer mother died with HIV. 2 children have allergies and several in family have asthma.  Prior to Admission medications   Medication Sig Start Date End Date Taking? Authorizing Provider  albuterol (PROVENTIL HFA;VENTOLIN HFA) 108 (90 BASE) MCG/ACT inhaler Inhale 2 puffs into the lungs every 6 (six) hours as needed. Shortness of breath 08/30/12  Yes Nicki Reaper, NP  cyclobenzaprine (FLEXERIL) 10 MG tablet Take 1 tablet (10 mg total) by mouth 3 (three) times daily as needed for muscle spasms. 10/11/12  Yes Newt Lukes, MD  levothyroxine (SYNTHROID, LEVOTHROID) 75 MCG tablet Take 1 tablet (75 mcg total) by mouth daily before breakfast. 10/11/12  Yes Carlus Pavlov, MD  loratadine (CLARITIN) 10 MG tablet Take 1 tablet (10 mg total) by mouth daily. 10/11/12  Yes Nicki Reaper, NP  montelukast (SINGULAIR) 10 MG tablet Take 1 tablet (10 mg total) by mouth at bedtime. 12/18/12   Waymon Budge, MD  pregabalin (LYRICA) 50 MG capsule Take 1 capsule (50 mg total) by mouth 3 (three) times daily. 10/11/12   Newt Lukes, MD   Past Medical History  Diagnosis Date  . Asthma   . Graves disease     I-131 ablation late 07/2012  . Chronic back pain    Past Surgical History  Procedure Laterality Date  . Liposuction    . Cesarean section    . Nm thyroid stim supress     Family History  Problem Relation Age of Onset  . HIV Mother   . Diabetes Mellitus II Mother   . Hypertension Father   .  Allergies Child   . Allergies Child   . Colon cancer Paternal Grandmother   . Stomach cancer Paternal Grandmother   . Breast cancer Maternal Aunt   . Asthma Maternal Aunt    History   Social History  . Marital Status: Single    Spouse Name: N/A    Number of Children: N/A  . Years of Education: N/A   Occupational History  . Not on file.   Social History Main Topics  . Smoking status: Current Every Day Smoker -- 0.25 packs/day for 7 years  . Smokeless tobacco: Never Used  . Alcohol Use: Yes     Comment: occasional  . Drug Use: No  . Sexual Activity: Yes   Other Topics Concern  . Not on file   Social History Narrative   ** Merged History Encounter **      Regular exercise: no   Caffeine use: none       ROS-see HPI Constitutional:   No-   weight loss, night sweats, fevers, chills, fatigue, lassitude. HEENT:   No-  headaches, difficulty swallowing, tooth/dental problems, sore throat,       + sneezing,+ itching, no-ear ache, nasal congestion, post nasal drip,  CV:  No-   chest pain, orthopnea, PND, swelling in lower extremities, anasarca, dizziness, palpitations Resp: No-   shortness of breath with exertion or at rest.  No-   productive cough,  No non-productive cough,  No- coughing up of blood.              No-   change in color of mucus.  No- wheezing.   Skin: No-   rash or lesions. GI:  No-   heartburn, indigestion, abdominal pain, nausea, vomiting, diarrhea,                 change in bowel habits, loss of appetite GU: No-   dysuria, change in color of urine, no urgency or frequency.  No- flank pain. MS:  No-   joint pain or swelling.  No- decreased range of motion.  No- back pain. Neuro-     nothing unusual Psych:  No- change in mood or affect. No depression or anxiety.  No memory loss.  OBJ- Physical Exam General- Alert, Oriented, Affect-appropriate, Distress- none acute Skin- rash-none, lesions- none, excoriation- none Lymphadenopathy- none Head-  atraumatic            Eyes- Gross vision intact, PERRLA, conjunctivae and secretions clear            Ears- Hearing, canals-normal            Nose- Clear, no-Septal dev, mucus, polyps, erosion, perforation             Throat- Mallampati II , mucosa clear , drainage- none, tonsils- atrophic Neck- flexible , trachea midline, no stridor , thyroid nl, carotid no bruit Chest - symmetrical excursion , unlabored           Heart/CV- RRR , no murmur , no gallop  , no rub, nl s1 s2                           - JVD- none , edema- none, stasis changes- none, varices- none           Lung- clear to P&A, wheeze- none, cough- none , dullness-none, rub- none           Chest wall-  Abd- tender-no, distended-no, bowel sounds-present, HSM- no Br/ Gen/ Rectal- Not done, not indicated Extrem- cyanosis- none, clubbing, none, atrophy- none, strength- nl Neuro- grossly intact to observation

## 2012-12-14 NOTE — Patient Instructions (Signed)
Consider the environmental dust and pollen control measures we discussed   Order- lab- Allergy profile    Dx allergic rhinitis

## 2012-12-15 LAB — ALLERGY FULL PROFILE
Allergen,Goose feathers, e70: 0.99 kU/L — ABNORMAL HIGH
Aspergillus fumigatus, m3: <0.1 kU/L
Bermuda Grass: 0.41 kU/L — ABNORMAL HIGH
Candida Albicans: 0.2 kU/L — ABNORMAL HIGH
D. farinae: <0.1 kU/L
G005 Rye, Perennial: 6.82 kU/L — ABNORMAL HIGH
IgE (Immunoglobulin E), Serum: 217.2 IU/mL — ABNORMAL HIGH (ref 0.0–180.0)
Oak: <0.1 kU/L
Plantain: <0.1 kU/L
Stemphylium Botryosum: <0.1 kU/L
Timothy Grass: 5.71 kU/L — ABNORMAL HIGH

## 2012-12-15 NOTE — Progress Notes (Signed)
Quick Note:  LMTCB ______ 

## 2012-12-18 ENCOUNTER — Telehealth: Payer: Self-pay | Admitting: Internal Medicine

## 2012-12-18 MED ORDER — MONTELUKAST SODIUM 10 MG PO TABS: 10.0000 mg | ORAL_TABLET | Freq: Every day | ORAL | Status: DC

## 2012-12-18 NOTE — Telephone Encounter (Signed)
I spoke with patient regarding her allergy labs. She understands that she has elevated antibody levels to certain things. Pt would like to come in to possibly have skin testing and see about starting allergy injections. CY, Please advise if you are okay with this. Pt is aware that it will be Jan 2015 before an opening for skin testing, therefore patient would like other recomendations prior to testing(she currently is using Allegra without much relief). Pt is around a dog 24/7 per our phone conversation. Thanks.

## 2012-12-18 NOTE — Progress Notes (Signed)
Quick Note:  Pt aware of results. ______ 

## 2012-12-18 NOTE — Telephone Encounter (Signed)
Pt aware of recs. rx sent 

## 2012-12-18 NOTE — Telephone Encounter (Signed)
Per CY-have patient add Singulair 10 mg #30 take 1 po qd with prn refills.

## 2012-12-29 NOTE — Assessment & Plan Note (Signed)
She describes allergic rhinitis primarily with exposure to dogs but also other nonspecific, and environmental triggers such as grass and house dust. Plan-environmental dust and pollen controls education, including air cleaners and bed encasings. Plan-allergy profile. Consider skin testing later.

## 2013-01-24 ENCOUNTER — Encounter (HOSPITAL_COMMUNITY): Payer: Self-pay | Admitting: Emergency Medicine

## 2013-01-24 ENCOUNTER — Emergency Department (HOSPITAL_COMMUNITY)
Admission: EM | Admit: 2013-01-24 | Discharge: 2013-01-24 | Disposition: A | Discharge status: Home / Self Care | Payer: BC Managed Care – PPO | Attending: Emergency Medicine | Admitting: Emergency Medicine

## 2013-01-24 ENCOUNTER — Emergency Department (HOSPITAL_COMMUNITY): Payer: BC Managed Care – PPO

## 2013-01-24 DIAGNOSIS — F17200 Nicotine dependence, unspecified, uncomplicated: Secondary | ICD-10-CM | POA: Insufficient documentation

## 2013-01-24 DIAGNOSIS — N390 Urinary tract infection, site not specified: Secondary | ICD-10-CM | POA: Insufficient documentation

## 2013-01-24 DIAGNOSIS — R0682 Tachypnea, not elsewhere classified: Secondary | ICD-10-CM | POA: Insufficient documentation

## 2013-01-24 DIAGNOSIS — J45909 Unspecified asthma, uncomplicated: Secondary | ICD-10-CM

## 2013-01-24 DIAGNOSIS — G8929 Other chronic pain: Secondary | ICD-10-CM | POA: Insufficient documentation

## 2013-01-24 DIAGNOSIS — Z79899 Other long term (current) drug therapy: Secondary | ICD-10-CM | POA: Insufficient documentation

## 2013-01-24 DIAGNOSIS — J45901 Unspecified asthma with (acute) exacerbation: Secondary | ICD-10-CM | POA: Insufficient documentation

## 2013-01-24 DIAGNOSIS — E0500 Thyrotoxicosis with diffuse goiter without thyrotoxic crisis or storm: Secondary | ICD-10-CM | POA: Insufficient documentation

## 2013-01-24 DIAGNOSIS — R5381 Other malaise: Secondary | ICD-10-CM | POA: Insufficient documentation

## 2013-01-24 DIAGNOSIS — R52 Pain, unspecified: Secondary | ICD-10-CM | POA: Insufficient documentation

## 2013-01-24 LAB — URINALYSIS, ROUTINE W REFLEX MICROSCOPIC
Nitrite: POSITIVE — AB
Specific Gravity, Urine: 1.04 — ABNORMAL HIGH (ref 1.005–1.030)
Urobilinogen, UA: 1 mg/dL (ref 0.0–1.0)
pH: 5.5 (ref 5.0–8.0)

## 2013-01-24 LAB — CBC WITH DIFFERENTIAL/PLATELET
Eosinophils Relative: 1 % (ref 0–5)
Lymphocytes Relative: 52 % — ABNORMAL HIGH (ref 12–46)
MCV: 89 fL (ref 78.0–100.0)
Monocytes Relative: 16 % — ABNORMAL HIGH (ref 3–12)
Neutrophils Relative %: 30 % — ABNORMAL LOW (ref 43–77)
Platelets: 203 10*3/uL (ref 150–400)
RBC: 4.09 MIL/uL (ref 3.87–5.11)
WBC: 3 10*3/uL — ABNORMAL LOW (ref 4.0–10.5)

## 2013-01-24 LAB — URINE MICROSCOPIC-ADD ON

## 2013-01-24 LAB — BASIC METABOLIC PANEL
CO2: 27 mEq/L (ref 19–32)
Calcium: 8.9 mg/dL (ref 8.4–10.5)
GFR calc Af Amer: 89 mL/min — ABNORMAL LOW (ref >90)
GFR calc non Af Amer: 76 mL/min — ABNORMAL LOW (ref >90)
Sodium: 138 mEq/L (ref 135–145)

## 2013-01-24 MED ORDER — PREDNISONE 20 MG PO TABS: ORAL_TABLET | ORAL | Status: DC

## 2013-01-24 MED ORDER — DEXTROSE 5 % IV SOLN
1.0000 g | Freq: Once | INTRAVENOUS | Status: AC
  Administered 2013-01-24: 1 g via INTRAVENOUS
  Filled 2013-01-24: qty 10

## 2013-01-24 MED ORDER — METHYLPREDNISOLONE SODIUM SUCC 125 MG IJ SOLR
125.0000 mg | Freq: Once | INTRAMUSCULAR | Status: AC
  Administered 2013-01-24: 125 mg via INTRAVENOUS
  Filled 2013-01-24: qty 2

## 2013-01-24 MED ORDER — ALBUTEROL SULFATE (2.5 MG/3ML) 0.083% IN NEBU: 2.5000 mg | INHALATION_SOLUTION | RESPIRATORY_TRACT | Status: DC | PRN

## 2013-01-24 MED ORDER — ALBUTEROL SULFATE HFA 108 (90 BASE) MCG/ACT IN AERS: 2.0000 | INHALATION_SPRAY | RESPIRATORY_TRACT | Status: DC | PRN

## 2013-01-24 MED ORDER — HYDROCODONE-ACETAMINOPHEN 5-325 MG PO TABS: 2.0000 | ORAL_TABLET | ORAL | Status: DC | PRN

## 2013-01-24 MED ORDER — IPRATROPIUM BROMIDE 0.02 % IN SOLN
0.5000 mg | Freq: Once | RESPIRATORY_TRACT | Status: AC
  Administered 2013-01-24: 0.5 mg via RESPIRATORY_TRACT
  Filled 2013-01-24: qty 2.5

## 2013-01-24 MED ORDER — ALBUTEROL SULFATE (5 MG/ML) 0.5% IN NEBU
5.0000 mg | INHALATION_SOLUTION | Freq: Once | RESPIRATORY_TRACT | Status: AC
  Administered 2013-01-24: 5 mg via RESPIRATORY_TRACT
  Filled 2013-01-24: qty 1

## 2013-01-24 MED ORDER — CEPHALEXIN 500 MG PO CAPS: 500.0000 mg | ORAL_CAPSULE | Freq: Four times a day (QID) | ORAL | Status: DC

## 2013-01-24 MED ORDER — IBUPROFEN 200 MG PO TABS
600.0000 mg | ORAL_TABLET | Freq: Once | ORAL | Status: AC
  Administered 2013-01-24: 600 mg via ORAL
  Filled 2013-01-24 (×2): qty 1

## 2013-01-24 NOTE — ED Provider Notes (Signed)
CSN: [id number]     Arrival date & time 01/24/13  0907 History   First MD Initiated Contact with Patient 01/24/13 [phone]     Chief Complaint  Patient presents with  . Asthma  . Cough   (Consider location/radiation/quality/duration/timing/severity/associated sxs/prior Treatment) HPI Comments: Patient presents to the ER for evaluation of difficulty breathing. Patient has a history of asthma. The last 2 or 3 days she has had progressive worsening asthma with a productive cough. She reports generalized malaise with back and body aches, pains. She has not documented any fevers. No nausea, vomiting or diarrhea. Patient has been using her inhaler and nebulizer without any improvement in her breathing.  Patient is a 37 y.o. female presenting with asthma and cough.  Asthma Associated symptoms include shortness of breath.  Cough Associated symptoms: shortness of breath and wheezing     Past Medical History  Diagnosis Date  . Asthma   . Graves disease     I-131 ablation late 07/2012  . Chronic back pain    Past Surgical History  Procedure Laterality Date  . Liposuction    . Cesarean section    . Nm thyroid stim supress     Family History  Problem Relation Age of Onset  . HIV Mother   . Diabetes Mellitus II Mother   . Hypertension Father   . Allergies Child   . Allergies Child   . Colon cancer Paternal Grandmother   . Stomach cancer Paternal Grandmother   . Breast cancer Maternal Aunt   . Asthma Maternal Aunt    History  Substance Use Topics  . Smoking status: Current Every Day Smoker -- 0.25 packs/day for 7 years  . Smokeless tobacco: Never Used  . Alcohol Use: Yes     Comment: occasional   OB History   Grav Para Term Preterm Abortions TAB SAB Ect Mult Living                 Review of Systems  Respiratory: Positive for cough, shortness of breath and wheezing.   All other systems reviewed and are negative.    Allergies  Tapazole  Home Medications   Current  Outpatient Rx  Name  Route  Sig  Dispense  Refill  . albuterol (PROVENTIL HFA;VENTOLIN HFA) 108 (90 BASE) MCG/ACT inhaler   Inhalation   Inhale 2 puffs into the lungs every 6 (six) hours as needed. Shortness of breath   18 g   0   . cyclobenzaprine (FLEXERIL) 10 MG tablet   Oral   Take 1 tablet (10 mg total) by mouth 3 (three) times daily as needed for muscle spasms.   30 tablet   0   . levothyroxine (SYNTHROID, LEVOTHROID) 75 MCG tablet   Oral   Take 1 tablet (75 mcg total) by mouth daily before breakfast.   60 tablet   3   . loratadine (CLARITIN) 10 MG tablet   Oral   Take 1 tablet (10 mg total) by mouth daily.   30 tablet   3   . montelukast (SINGULAIR) 10 MG tablet   Oral   Take 1 tablet (10 mg total) by mouth at bedtime.   30 tablet   PRN   . pregabalin (LYRICA) 50 MG capsule   Oral   Take 1 capsule (50 mg total) by mouth 3 (three) times daily.   90 capsule   1    BP 123/82  Pulse 97  Temp(Src) 98.4 F (36.9 C) (Oral)  Resp  20  SpO2 96%  LMP 01/24/2013 Physical Exam  Constitutional: She is oriented to person, place, and time. She appears well-developed and well-nourished. No distress.  HENT:  Head: Normocephalic and atraumatic.  Right Ear: Hearing normal.  Left Ear: Hearing normal.  Nose: Nose normal.  Mouth/Throat: Oropharynx is clear and moist and mucous membranes are normal.  Eyes: Conjunctivae and EOM are normal. Pupils are equal, round, and reactive to light.  Neck: Normal range of motion. Neck supple.  Cardiovascular: Regular rhythm, S1 normal and S2 normal.  Exam reveals no gallop and no friction rub.   No murmur heard. Pulmonary/Chest: Accessory muscle usage present. Tachypnea noted. She has decreased breath sounds. She has wheezes. She exhibits no tenderness.  Abdominal: Soft. Normal appearance and bowel sounds are normal. There is no hepatosplenomegaly. There is no tenderness. There is no rebound, no guarding, no tenderness at McBurney's point  and negative Murphy's sign. No hernia.  Musculoskeletal: Normal range of motion.  Neurological: She is alert and oriented to person, place, and time. She has normal strength. No cranial nerve deficit or sensory deficit. Coordination normal. GCS eye subscore is 4. GCS verbal subscore is 5. GCS motor subscore is 6.  Skin: Skin is warm, dry and intact. No rash noted. No cyanosis.  Psychiatric: She has a normal mood and affect. Her speech is normal and behavior is normal. Thought content normal.    ED Course  Procedures (including critical care time) Labs Review Labs Reviewed  BASIC METABOLIC PANEL - Abnormal; Notable for the following:    Potassium 3.3 (*)    GFR calc non Af Amer 76 (*)    GFR calc Af Amer 89 (*)    All other components within normal limits  CBC WITH DIFFERENTIAL - Abnormal; Notable for the following:    WBC 3.0 (*)    Neutrophils Relative % 30 (*)    Lymphocytes Relative 52 (*)    Monocytes Relative 16 (*)    Neutro Abs 0.9 (*)    All other components within normal limits  URINALYSIS, ROUTINE W REFLEX MICROSCOPIC - Abnormal; Notable for the following:    Color, Urine RED (*)    APPearance TURBID (*)    Specific Gravity, Urine 1.040 (*)    Hgb urine dipstick LARGE (*)    Bilirubin Urine LARGE (*)    Ketones, ur 40 (*)    Protein, ur >300 (*)    Nitrite POSITIVE (*)    Leukocytes, UA MODERATE (*)    All other components within normal limits  URINE MICROSCOPIC-ADD ON - Abnormal; Notable for the following:    Squamous Epithelial / LPF FEW (*)    Bacteria, UA FEW (*)    All other components within normal limits  URINE CULTURE  PATHOLOGIST SMEAR REVIEW   Imaging Review Dg Chest 2 View (if Patient Has Fever And/or Copd)  01/24/2013   CLINICAL DATA:  Cough and asthma  EXAM: CHEST  2 VIEW  COMPARISON:  Chest radiograph March 21, 2012 and chest CT October 03, 2012  FINDINGS: Lungs are clear. Heart size and pulmonary vascularity are normal. No adenopathy. No bone  lesions.  IMPRESSION: No abnormality noted.   Electronically Signed   By: Bretta Bang M.D.   On: 01/24/2013 09:42    EKG Interpretation   None       MDM  Diagnosis: 1. Asthma exacerbation 2. Respiratory infection 3. UTI  Patient presents to the ER for evaluation of difficulty breathing. Patient has a  history of asthma, has been using albuterol without improvement. She has wheezing on arrival but no hypoxia. Clinical concern for pneumonia, but based on her presentation, x-ray performed. No pneumonia seen. Patient does have generalized myalgias with some specific back pain. Urinalysis was therefore performed and there is evidence of urinary tract infection. Patient is afebrile, no vomiting. He did not feel that she has nephritis and is a candidate for outpatient treatment. Patient administered Rocephin here in the ER as well as Solu-Medrol and nebulizer treatments. Repeat examination reveals minimal wheezing and she is breathing comfortably.    Gilda Crease, MD 01/24/13 [phone]

## 2013-01-24 NOTE — ED Notes (Signed)
Pt states she's been treating herself at home, but she keeps flaring.  Pt with audible expiratory wheezes.

## 2013-01-25 LAB — URINE CULTURE: Colony Count: NO GROWTH

## 2013-02-13 ENCOUNTER — Ambulatory Visit: Payer: BC Managed Care – PPO | Admitting: Internal Medicine

## 2013-02-17 ENCOUNTER — Emergency Department (INDEPENDENT_AMBULATORY_CARE_PROVIDER_SITE_OTHER)
Admission: EM | Admit: 2013-02-17 | Discharge: 2013-02-17 | Disposition: A | Discharge status: Home / Self Care | Payer: Self-pay | Source: Home / Self Care | Attending: Family Medicine | Admitting: Family Medicine

## 2013-02-17 DIAGNOSIS — J45901 Unspecified asthma with (acute) exacerbation: Secondary | ICD-10-CM

## 2013-02-17 DIAGNOSIS — J4541 Moderate persistent asthma with (acute) exacerbation: Secondary | ICD-10-CM

## 2013-02-17 MED ORDER — IPRATROPIUM BROMIDE 0.02 % IN SOLN
RESPIRATORY_TRACT | Status: AC
  Filled 2013-02-17: qty 2.5

## 2013-02-17 MED ORDER — ALBUTEROL SULFATE (5 MG/ML) 0.5% IN NEBU
5.0000 mg | INHALATION_SOLUTION | Freq: Once | RESPIRATORY_TRACT | Status: AC
  Administered 2013-02-17: 5 mg via RESPIRATORY_TRACT

## 2013-02-17 MED ORDER — IPRATROPIUM BROMIDE 0.02 % IN SOLN
0.5000 mg | Freq: Once | RESPIRATORY_TRACT | Status: AC
  Administered 2013-02-17: 0.5 mg via RESPIRATORY_TRACT

## 2013-02-17 MED ORDER — ALBUTEROL SULFATE HFA 108 (90 BASE) MCG/ACT IN AERS: 1.0000 | INHALATION_SPRAY | Freq: Four times a day (QID) | RESPIRATORY_TRACT | Status: DC | PRN

## 2013-02-17 MED ORDER — HYDROCOD POLST-CHLORPHEN POLST 10-8 MG/5ML PO LQCR: 5.0000 mL | Freq: Two times a day (BID) | ORAL | Status: DC | PRN

## 2013-02-17 MED ORDER — METHYLPREDNISOLONE SODIUM SUCC 125 MG IJ SOLR
INTRAMUSCULAR | Status: AC
  Filled 2013-02-17: qty 2

## 2013-02-17 MED ORDER — ALBUTEROL SULFATE (5 MG/ML) 0.5% IN NEBU: 2.5000 mg | INHALATION_SOLUTION | Freq: Four times a day (QID) | RESPIRATORY_TRACT | Status: DC | PRN

## 2013-02-17 MED ORDER — ALBUTEROL SULFATE (2.5 MG/3ML) 0.083% IN NEBU
INHALATION_SOLUTION | RESPIRATORY_TRACT | Status: AC
  Filled 2013-02-17: qty 6

## 2013-02-17 MED ORDER — ALBUTEROL SULFATE (5 MG/ML) 0.5% IN NEBU
10.0000 mg | INHALATION_SOLUTION | Freq: Once | RESPIRATORY_TRACT | Status: AC
  Administered 2013-02-17: 10 mg via RESPIRATORY_TRACT

## 2013-02-17 MED ORDER — PREDNISONE 50 MG PO TABS: 50.0000 mg | ORAL_TABLET | Freq: Every day | ORAL | Status: DC

## 2013-02-17 MED ORDER — PREDNISONE 20 MG PO TABS: 60.0000 mg | ORAL_TABLET | Freq: Once | ORAL | Status: DC

## 2013-02-17 MED ORDER — METHYLPREDNISOLONE SODIUM SUCC 125 MG IJ SOLR
125.0000 mg | Freq: Once | INTRAMUSCULAR | Status: AC
  Administered 2013-02-17: 125 mg via INTRAMUSCULAR

## 2013-02-17 MED ORDER — ALBUTEROL SULFATE (2.5 MG/3ML) 0.083% IN NEBU
INHALATION_SOLUTION | RESPIRATORY_TRACT | Status: AC
  Filled 2013-02-17: qty 12

## 2013-02-17 NOTE — ED Notes (Signed)
Patient complains of cough and cold sx , patient also has asthma and her inhalers have not been effective

## 2013-02-17 NOTE — Discharge Instructions (Signed)
Asthma, Acute Bronchospasm °Acute bronchospasm caused by asthma is also referred to as an asthma attack. Bronchospasm means your air passages become narrowed. The narrowing is caused by inflammation and tightening of the muscles in the air tubes (bronchi) in your lungs. This can make it hard to breath or cause you to wheeze and cough. °CAUSES °Possible triggers are: °· Animal dander from the skin, hair, or feathers of animals. °· Dust mites contained in house dust. °· Cockroaches. °· Pollen from trees or grass. °· Mold. °· Cigarette or tobacco smoke. °· Air pollutants such as dust, household cleaners, hair sprays, aerosol sprays, paint fumes, strong chemicals, or strong odors. °· Cold air or weather changes. Cold air may trigger inflammation. Winds increase molds and pollens in the air. °· Strong emotions such as crying or laughing hard. °· Stress. °· Certain medicines such as aspirin or beta-blockers. °· Sulfites in foods and drinks, such as dried fruits and wine. °· Infections or inflammatory conditions, such as a flu, cold, or inflammation of the nasal membranes (rhinitis). °· Gastroesophageal reflux disease (GERD). GERD is a condition where stomach acid backs up into your throat (esophagus). °· Exercise or strenuous activity. °SIGNS AND SYMPTOMS  °· Wheezing. °· Excessive coughing, particularly at night. °· Chest tightness. °· Shortness of breath. °DIAGNOSIS  °Your health care provider will ask you about your medical history and perform a physical exam. A chest X-ray or blood testing may be performed to look for other causes of your symptoms or other conditions that may have triggered your asthma attack.  °TREATMENT  °Treatment is aimed at reducing inflammation and opening up the airways in your lungs.  Most asthma attacks are treated with inhaled medicines. These include quick relief or rescue medicines (such as bronchodilators) and controller medicines (such as inhaled corticosteroids). These medicines are  sometimes given through an inhaler or a nebulizer. Systemic steroid medicine taken by mouth or given through an IV tube also can be used to reduce the inflammation when an attack is moderate or severe. Antibiotic medicines are only used if a bacterial infection is present.  °HOME CARE INSTRUCTIONS  °· Rest. °· Drink plenty of liquids. This helps the mucus to remain thin and be easily coughed up. Only use caffeine in moderation and do not use alcohol until you have recovered from your illness. °· Do not smoke. Avoid being exposed to secondhand smoke. °· You play a critical role in keeping yourself in good health. Avoid exposure to things that cause you to wheeze or to have breathing problems. °· Keep your medicines up to date and available. Carefully follow your health care provider's treatment plan. °· Take your medicine exactly as prescribed. °· When pollen or pollution is bad, keep windows closed and use an air conditioner or go to places with air conditioning. °· Asthma requires careful medical care. See your health care provider for a follow-up as advised. If you are more than [redacted] weeks pregnant and you were prescribed any new medicines, let your obstetrician know about the visit and how you are doing. Follow-up with your health care provider as directed. °· After you have recovered from your asthma attack, make an appointment with your outpatient doctor to talk about ways to reduce the likelihood of future attacks. If you do not have a doctor who manages your asthma, make an appointment with a primary care doctor to discuss your asthma. °SEEK IMMEDIATE MEDICAL CARE IF:  °· You are getting worse. °· You have trouble breathing. If severe, call   your local emergency services (911 in the [place] °· You develop chest pain or discomfort. °· You are vomiting. °· You are not able to keep fluids down. °· You are coughing up yellow, green, brown, or bloody sputum. °· You have a fever and your symptoms suddenly get  worse. °· You have trouble swallowing. °MAKE SURE YOU:  °· Understand these instructions. °· Will watch your condition. °· Will get help right away if you are not doing well or get worse. °Document Released: 05/12/2006 Document Revised: 09/27/2012 Document Reviewed: 08/02/2012 °[employer]® Patient Information ©2014 [employer]. ° °

## 2013-02-17 NOTE — ED Provider Notes (Signed)
CSN: [id number]     Arrival date & time 02/17/13  1113 History   First MD Initiated Contact with Patient 02/17/13 1204     Chief Complaint  Patient presents with  . Cough   (Consider location/radiation/quality/duration/timing/severity/associated sxs/prior Treatment) HPI Comments: 38 year old female presents complaining of asthma exacerbation and upper respiratory infection. For 2 days, she has been Sick with cough and wheezing. She has been using her nebulizer at home and it does not seem to be helping. The cough is worse at night and she feels overall fatigued. She also has nasal congestion and runny nose, and feels slightly short of breath. She denies fever, chills, pleuritic chest pain, leg swelling, recent travel. She does have sick contacts with colds.   Past Medical History  Diagnosis Date  . Asthma   . Graves disease     I-131 ablation late 07/2012  . Chronic back pain    Past Surgical History  Procedure Laterality Date  . Liposuction    . Cesarean section    . Nm thyroid stim supress     Family History  Problem Relation Age of Onset  . HIV Mother   . Diabetes Mellitus II Mother   . Hypertension Father   . Allergies Child   . Allergies Child   . Colon cancer Paternal Grandmother   . Stomach cancer Paternal Grandmother   . Breast cancer Maternal Aunt   . Asthma Maternal Aunt    History  Substance Use Topics  . Smoking status: Current Every Day Smoker -- 0.25 packs/day for 7 years  . Smokeless tobacco: Never Used  . Alcohol Use: Yes     Comment: occasional   OB History   Grav Para Term Preterm Abortions TAB SAB Ect Mult Living                 Review of Systems  Constitutional: Negative for fever and chills.  Eyes: Negative for visual disturbance.  Respiratory: Positive for chest tightness and wheezing. Negative for shortness of breath.   Cardiovascular: Negative for chest pain, palpitations and leg swelling.  Gastrointestinal: Negative for nausea, vomiting and  abdominal pain.  Endocrine: Negative for polydipsia and polyuria.  Genitourinary: Negative for dysuria, urgency and frequency.  Musculoskeletal: Negative for arthralgias and myalgias.  Skin: Negative for rash.  Neurological: Negative for dizziness, weakness and light-headedness.    Allergies  Tapazole  Home Medications   Current Outpatient Rx  Name  Route  Sig  Dispense  Refill  . albuterol (PROVENTIL HFA;VENTOLIN HFA) 108 (90 BASE) MCG/ACT inhaler   Inhalation   Inhale 2 puffs into the lungs every 4 (four) hours as needed for wheezing or shortness of breath.   1 Inhaler   2   . albuterol (PROVENTIL) (2.5 MG/3ML) 0.083% nebulizer solution   Nebulization   Take 3 mLs (2.5 mg total) by nebulization every 4 (four) hours as needed for wheezing or shortness of breath.   30 vial   0   . HYDROcodone-acetaminophen (NORCO/VICODIN) 5-325 MG per tablet   Oral   Take 2 tablets by mouth every 4 (four) hours as needed.   20 tablet   0   . levothyroxine (SYNTHROID, LEVOTHROID) 75 MCG tablet   Oral   Take 1 tablet (75 mcg total) by mouth daily before breakfast.   60 tablet   3   . montelukast (SINGULAIR) 10 MG tablet   Oral   Take 1 tablet (10 mg total) by mouth at bedtime.   30  tablet   PRN   . albuterol (PROVENTIL HFA;VENTOLIN HFA) 108 (90 BASE) MCG/ACT inhaler   Inhalation   Inhale 2 puffs into the lungs every 6 (six) hours as needed. Shortness of breath   18 g   0   . albuterol (PROVENTIL HFA;VENTOLIN HFA) 108 (90 BASE) MCG/ACT inhaler   Inhalation   Inhale 1-2 puffs into the lungs every 6 (six) hours as needed for wheezing or shortness of breath.   1 Inhaler   0   . albuterol (PROVENTIL) (5 MG/ML) 0.5% nebulizer solution   Nebulization   Take 0.5 mLs (2.5 mg total) by nebulization every 6 (six) hours as needed for wheezing or shortness of breath.   30 vial   2     Please administer 1 box of 2.5 mg plastic disposab ...   . cephALEXin (KEFLEX) 500 MG capsule    Oral   Take 1 capsule (500 mg total) by mouth 4 (four) times daily.   28 capsule   0   . chlorpheniramine-HYDROcodone (TUSSIONEX PENNKINETIC ER) 10-8 MG/5ML LQCR   Oral   Take 5 mLs by mouth every 12 (twelve) hours as needed for cough.   60 mL   0   . predniSONE (DELTASONE) 20 MG tablet      3 tabs po day one, then 2 tabs daily x 4 days   11 tablet   0   . predniSONE (DELTASONE) 50 MG tablet   Oral   Take 1 tablet (50 mg total) by mouth daily with breakfast.   5 tablet   0    BP 113/74  Pulse 74  Temp(Src) 98.3 F (36.8 C) (Oral)  Resp 16  SpO2 100%  LMP 01/24/2013 Physical Exam  Nursing note and vitals reviewed. Constitutional: She is oriented to person, place, and time. Vital signs are normal. She appears well-developed and well-nourished. No distress.  HENT:  Head: Normocephalic and atraumatic.  Cardiovascular: Normal rate, regular rhythm and normal heart sounds.  Exam reveals no gallop and no friction rub.   No murmur heard. Pulmonary/Chest: Effort normal. No respiratory distress. She has wheezes (diffuse, expiratory). She has no rhonchi. She has no rales.  Neurological: She is alert and oriented to person, place, and time. She has normal strength. Coordination normal.  Skin: Skin is warm and dry. No rash noted. She is not diaphoretic.  Psychiatric: She has a normal mood and affect. Judgment normal.    ED Course  Procedures (including critical care time) Labs Review Labs Reviewed - No data to display Imaging Review No results found.  125 mg Solumedrol IM and duoneb Minimal improvement after nebulizer treatment. Repeating the treatment Minimal wheezing after second nebulizer treatment.  She is feeling better and requesting to go home, she will come back if she gets any worse again   MDM   1. Asthma exacerbation, moderate persistent    Probable viral URI precipitating this asthma exacerbation. Improvement with nebulizer treatments and steroids. Followup if  any worsening  Meds ordered this encounter  Medications  . DISCONTD: predniSONE (DELTASONE) tablet 60 mg    Sig:   . ipratropium (ATROVENT) nebulizer solution 0.5 mg    Sig:    And  . albuterol (PROVENTIL) (5 MG/ML) 0.5% nebulizer solution 5 mg    Sig:   . methylPREDNISolone sodium succinate (SOLU-MEDROL) 125 mg/2 mL injection 125 mg    Sig:   . ipratropium (ATROVENT) nebulizer solution 0.5 mg    Sig:    And  .  albuterol (PROVENTIL) (5 MG/ML) 0.5% nebulizer solution 10 mg    Sig:   . predniSONE (DELTASONE) 50 MG tablet    Sig: Take 1 tablet (50 mg total) by mouth daily with breakfast.    Dispense:  5 tablet    Refill:  0    Order Specific Question:  Supervising Provider    Answer:  Linna Hoff [phone]  . albuterol (PROVENTIL HFA;VENTOLIN HFA) 108 (90 BASE) MCG/ACT inhaler    Sig: Inhale 1-2 puffs into the lungs every 6 (six) hours as needed for wheezing or shortness of breath.    Dispense:  1 Inhaler    Refill:  0    Order Specific Question:  Supervising Provider    Answer:  Bradd Canary D [id number]  . albuterol (PROVENTIL) (5 MG/ML) 0.5% nebulizer solution    Sig: Take 0.5 mLs (2.5 mg total) by nebulization every 6 (six) hours as needed for wheezing or shortness of breath.    Dispense:  30 vial    Refill:  2    Please administer 1 box of 2.5 mg plastic disposable albuterol nebulizer treatment vials    Order Specific Question:  Supervising Provider    Answer:  Linna Hoff [phone]  . chlorpheniramine-HYDROcodone (TUSSIONEX PENNKINETIC ER) 10-8 MG/5ML LQCR    Sig: Take 5 mLs by mouth every 12 (twelve) hours as needed for cough.    Dispense:  60 mL    Refill:  0    Order Specific Question:  Supervising Provider    Answer:  Bradd Canary D [[id number]]     Graylon Good, PA-C 02/18/13 2138

## 2013-02-19 NOTE — ED Provider Notes (Signed)
Medical screening examination/treatment/procedure(s) were performed by a resident physician or non-physician practitioner and as the supervising physician I was immediately available for consultation/collaboration.  Neri Samek, MD   Dashayla Theissen S Bilbo Carcamo, MD 02/19/13 0746 

## 2013-02-22 ENCOUNTER — Ambulatory Visit: Payer: BC Managed Care – PPO | Admitting: Internal Medicine

## 2013-02-22 DIAGNOSIS — Z0289 Encounter for other administrative examinations: Secondary | ICD-10-CM

## 2013-03-13 ENCOUNTER — Other Ambulatory Visit: Payer: Self-pay | Admitting: *Deleted

## 2013-03-13 DIAGNOSIS — R222 Localized swelling, mass and lump, trunk: Secondary | ICD-10-CM

## 2013-03-16 ENCOUNTER — Ambulatory Visit: Payer: BC Managed Care – PPO | Admitting: Internal Medicine

## 2013-03-19 ENCOUNTER — Other Ambulatory Visit: Payer: Self-pay | Admitting: Internal Medicine

## 2013-03-19 ENCOUNTER — Ambulatory Visit (INDEPENDENT_AMBULATORY_CARE_PROVIDER_SITE_OTHER): Payer: BC Managed Care – PPO | Admitting: Internal Medicine

## 2013-03-19 ENCOUNTER — Encounter: Payer: Self-pay | Admitting: Internal Medicine

## 2013-03-19 VITALS — BP 110/78 | HR 94 | Temp 98.4°F | Resp 12 | Wt 205.0 lb

## 2013-03-19 DIAGNOSIS — E890 Postprocedural hypothyroidism: Secondary | ICD-10-CM

## 2013-03-19 NOTE — Progress Notes (Signed)
Patient ID: Lacey Mccarty, female   DOB: 18-Jun-1975, 38 y.o.   MRN: [id number]   HPI  Lacey Mccarty is a 38 y.o.-year-old female, returning for followup for Graves Ds, dx 03/2012, now with iatrogenic hypothyroidism. Last visit 4 mo ago. She tells me she could not come back for a visit or labs as she lost her insurance for a period of time >> now has [employer].  Reviewed hx: She was dx in 03/2012, when she was admitted for asthma exacerbation. A CT scan of her chest performed on 03/22/2012 showed a thymic mass. Dr. Edwina Barth was consulted and he suggested to check a TSH, which returned low. Retrospectively, she had heat intolerance x 2 mo; had fatigue; did not lose weight; no palpitations; no anxiety; no tremors. CT scan after resolution of Graves disease >> thymus size decreased.  I reviewed pt's thyroid tests: Latest TSH obtained outside, was: 36 on 09/01 - we received records and called pt to start levothyroxine 75 mcg daily. She did not return fr repeat labs after starting the LT4. She also tells me that she is not taking the med every day, but may miss it 2x a week. When she takes it, she takes it with water, >30 min before b'fast. She is not on antiacid medication, calcium, iron, MVI.  Lab Results  Component Value Date   TSH 1.55 09/12/2012   TSH 0.80 08/30/2012   TSH 0.010* 03/22/2012   FREET4 0.59* 09/12/2012   FREET4 3.12* 03/22/2012  TSI 28  Reviewed imaging test reports: - 03/22/2012 chest CT: 5.6 x 5.0 x 3.6 cm thymic mass, possible thymoma. Of note, her hCG and alpha-fetoprotein were not high - 06/09/2012: Thyroid uptake and scan 71.5% uptake, no nodules, pyramidal lobe present; uniform increased uptake suggesting Graves' disease - 07/27/2012: Radioactive iodine ablation with 13.1 mCi - 10/03/2012: Chest CT: 3.5 x 2.6 cm well circumscribed anterior mediastinal mass, previously 4.6 x 4.3 cm when measured in a similar fashion.   She c/o: HAs, bleeding, weight gain Now: -   Bleeding stopped on OCPs added 1 week ago - + weight gain: 10 lbs in last 4 months - + back pain, but improved - no excessive sweating/heat intolerance - no tremors - no anxiety - no palpitations - no problems with concentration - + fatigue - + blurry vision  ROS: Constitutional: see above Eyes: no blurry vision, no xerophthalmia ENT: no sore throat, no nodules palpated in throat, no dysphagia/odynophagia, no hoarseness Cardiovascular: no CP/SOB/palpitations/leg swelling Respiratory: no cough/SOB/wheezing Gastrointestinal: no N/V/D/C Musculoskeletal: no muscle/joint aches Skin: no rashes Neurological: no tremors/numbness/tingling/dizziness  I reviewed pt's medications, allergies, PMH, social hx, family hx and no changes required, except as mentioned above.  PE: BP 110/78  Pulse 94  Temp(Src) 98.4 F (36.9 C) (Oral)  Resp 12  Wt 205 lb (92.987 kg)  SpO2 97% Wt Readings from Last 3 Encounters:  03/19/13 205 lb (92.987 kg)  12/14/12 206 lb (93.441 kg)  10/19/12 196 lb (88.905 kg)   Constitutional: overweight, in NAD Eyes: PERRLA, EOMI, no exophthalmos, no lid lag, no stare ENT: moist mucous membranes, no thyromegaly, no thyroid bruits, no cervical lymphadenopathy Cardiovascular: RRR, No MRG Respiratory: CTA B Gastrointestinal: abdomen soft, NT, ND, BS+ Musculoskeletal: no deformities, strength intact in all 4 Skin: moist, warm, no rashes Neurological: no tremor with outstretched hands, DTR normal in all 4  ASSESSMENT: 1. Graves ds - now with iatrogenic hypothyroidism - on levothyroxine 75 - no labs after starting LT4  2. 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 Patient with Luiz BlareGraves' disease, diagnosed last year, initially tried on methimazole, which she could not tolerate due to hives. She had radioactive iodine ablation on 07/27/2012. Her TSH normalized, but then quickly became elevated, at 36, per  records from [facility]. Pt was started on Levothyroxine 75, which she can miss 2/7 days. She did not return for labs after starting it. - check TSH and fT4 today, but if labs not very abnormal >> would ask pt to take it DAILY before changing the dose - I advised her to join my chart to communicate easier  Orders Only on 03/19/2013  Component Date Value Range Status  . TSH 03/19/2013 109.747* 0.350 - 4.500 uIU/mL Final  . Free T4 03/19/2013 0.36* 0.80 - 1.80 ng/dL Final   Will increase Levothyroxine to 100 and advise her to take it correctly, EVERY DAY.

## 2013-03-19 NOTE — Patient Instructions (Signed)
Please stop at the lab downstairs.  Please return in 6 months. Please join MyChart - I will send you the results through there.

## 2013-03-20 ENCOUNTER — Encounter: Payer: Self-pay | Admitting: *Deleted

## 2013-03-20 LAB — TSH: TSH: 109.747 u[IU]/mL — ABNORMAL HIGH (ref 0.350–4.500)

## 2013-03-20 LAB — T4, FREE: Free T4: 0.36 ng/dL — ABNORMAL LOW (ref 0.80–1.80)

## 2013-03-20 MED ORDER — LEVOTHYROXINE SODIUM 100 MCG PO TABS: 100.0000 ug | ORAL_TABLET | Freq: Every day | ORAL | Status: DC

## 2013-03-21 ENCOUNTER — Encounter: Payer: Self-pay | Admitting: Internal Medicine

## 2013-03-29 ENCOUNTER — Telehealth: Payer: Self-pay | Admitting: *Deleted

## 2013-03-29 ENCOUNTER — Encounter: Payer: Self-pay | Admitting: Internal Medicine

## 2013-03-29 NOTE — Telephone Encounter (Signed)
Pt called stating she is supposed to be traveling (flying) for a trip. Pt states she is severely fatigued, can hardly get out of bed and lethargic at times. She wants to know if Dr Elvera LennoxGherghe will do a letter for her to give to the company she is has her travel insurance with. Please advise.

## 2013-03-29 NOTE — Telephone Encounter (Signed)
I sent you a letter for her.

## 2013-04-03 ENCOUNTER — Ambulatory Visit: Payer: BC Managed Care – PPO | Admitting: Thoracic Surgery (Cardiothoracic Vascular Surgery)

## 2013-04-03 ENCOUNTER — Inpatient Hospital Stay: Admission: RE | Admit: 2013-04-03 | Payer: Self-pay | Source: Ambulatory Visit

## 2013-04-17 ENCOUNTER — Encounter: Payer: Self-pay | Admitting: Internal Medicine

## 2013-05-01 ENCOUNTER — Ambulatory Visit (INDEPENDENT_AMBULATORY_CARE_PROVIDER_SITE_OTHER): Payer: BC Managed Care – PPO | Admitting: Thoracic Surgery (Cardiothoracic Vascular Surgery)

## 2013-05-01 ENCOUNTER — Ambulatory Visit
Admission: RE | Admit: 2013-05-01 | Discharge: 2013-05-01 | Disposition: A | Discharge status: Home / Self Care | Payer: BC Managed Care – PPO | Source: Ambulatory Visit | Attending: Thoracic Surgery (Cardiothoracic Vascular Surgery) | Admitting: Thoracic Surgery (Cardiothoracic Vascular Surgery)

## 2013-05-01 ENCOUNTER — Encounter: Payer: Self-pay | Admitting: Thoracic Surgery (Cardiothoracic Vascular Surgery)

## 2013-05-01 VITALS — BP 121/76 | HR 73 | Resp 16 | Ht 63.5 in | Wt 198.0 lb

## 2013-05-01 DIAGNOSIS — E0590 Thyrotoxicosis, unspecified without thyrotoxic crisis or storm: Secondary | ICD-10-CM

## 2013-05-01 DIAGNOSIS — E320 Persistent hyperplasia of thymus: Secondary | ICD-10-CM

## 2013-05-01 DIAGNOSIS — R222 Localized swelling, mass and lump, trunk: Secondary | ICD-10-CM

## 2013-05-01 NOTE — Progress Notes (Signed)
HPI:  [name] returns for a scheduled followup visit.  She is a 38 year old woman who I saw in February of 2014 for anterior mediastinal mass. She was extremely hyperthyroid and was diagnosed with Graves' disease. The anterior mediastinal mass appeared consistent with thymic hyperplasia which is frequently associated with Graves' disease. We elected to follow the anterior mediastinal mass.  I saw her back in followup in August after her thyroid had been ablated and the mass had decreased in size. I recommended that she return for 6 month followup to make sure this was continuing to progress.  She says that in the interim since her last visit she has been gaining weight. She feels tired all the time. Apparently she was not taking her Synthroid on a regular basis. She saw Dr. Elvera LennoxGherghe who found that her TSH was 109 and her free T4 was 0.36. Her Synthroid dose was increased to 100 mcg daily and she was instructed to take a daily basis. She says she has been taking it every day since that visit.  Past Medical History  Diagnosis Date  . Asthma   . Graves disease     I-131 ablation late 07/2012  . Chronic back pain    anterior mediastinal mass-Probable thymic hyperplasia    Current Outpatient Prescriptions  Medication Sig Dispense Refill  . albuterol (PROVENTIL HFA;VENTOLIN HFA) 108 (90 BASE) MCG/ACT inhaler Inhale 1-2 puffs into the lungs every 6 (six) hours as needed for wheezing or shortness of breath.  1 Inhaler  0  . albuterol (PROVENTIL) (5 MG/ML) 0.5% nebulizer solution Take 0.5 mLs (2.5 mg total) by nebulization every 6 (six) hours as needed for wheezing or shortness of breath.  30 vial  2  . levothyroxine (SYNTHROID, LEVOTHROID) 100 MCG tablet Take 1 tablet (100 mcg total) by mouth daily.  60 tablet  3  . montelukast (SINGULAIR) 10 MG tablet Take 1 tablet (10 mg total) by mouth at bedtime.  30 tablet  PRN   No current facility-administered medications for this visit.    Physical  Exam BP 121/76  Pulse 73  Resp 16  Ht 5' 3.5" (1.613 m)  Wt 198 lb (89.812 kg)  BMI 34.52 kg/m2  SpO2 99%  LMP 05/01/2013 38 year old woman in no acute distress Overweight Cardiac regular rate and rhythm normal S1-S2 No palpable cervical or supraclavicular adenopathy Lungs clear with equal breath sounds bilaterally  Diagnostic Tests: CT chest 05/01/2013 CT CHEST WITHOUT CONTRAST  TECHNIQUE:  Multidetector CT imaging of the chest was performed following the  standard protocol without IV contrast.  COMPARISON: 10/03/2012 and 03/22/2012  FINDINGS:  Continued decrease in size of anterior mediastinal mass is seen  since previous study. The residuals soft tissue density now has a  triangular configuration with concave margins resembling thymic  tissue. This is consistent with resolving thymic hyperplasia. No  other mediastinal masses identified. No adenopathy seen elsewhere  within the thorax.  No evidence of pleural or pericardial effusion. No evidence of  pulmonary infiltrate or central endobronchial lesion. No suspicious  pulmonary nodules or masses.  IMPRESSION:  Continued regression of anterior mediastinal mass, consistent with  resolving thymic hyperplasia.  Electronically Signed  By: Myles RosenthalJohn Stahl M.D.  On: 05/01/2013 16:21  Impression: [name] is a 38 year old woman with a history of Graves' disease who was found to have an anterior mediastinal mass about a year ago. This has decreased in size but not totally resolved in each of to the past 2 six-month intervals since then.  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 It has improved since the hyperthyroidism is being corrected.  The soft tissue has not completely resolved, and he may never completely resolved. The only concern is that there could be a small thymoma present. I think we should repeat her scan in a year to see if there's been complete resolution and to make sure that there  is no underlying mass.  Plan: Return in one year with CT chest

## 2013-06-19 ENCOUNTER — Other Ambulatory Visit: Payer: BC Managed Care – PPO

## 2013-09-17 ENCOUNTER — Ambulatory Visit: Payer: BC Managed Care – PPO | Admitting: Internal Medicine

## 2013-09-17 DIAGNOSIS — Z0289 Encounter for other administrative examinations: Secondary | ICD-10-CM

## 2013-10-02 ENCOUNTER — Ambulatory Visit (INDEPENDENT_AMBULATORY_CARE_PROVIDER_SITE_OTHER): Payer: BC Managed Care – PPO

## 2013-10-02 DIAGNOSIS — E890 Postprocedural hypothyroidism: Secondary | ICD-10-CM

## 2013-10-02 LAB — T4, FREE: Free T4: 0.26 ng/dL — ABNORMAL LOW (ref 0.60–1.60)

## 2013-10-02 LAB — TSH: TSH: 91.58 u[IU]/mL — AB (ref 0.35–4.50)

## 2013-10-03 ENCOUNTER — Telehealth: Payer: Self-pay | Admitting: Internal Medicine

## 2013-10-03 ENCOUNTER — Encounter: Payer: Self-pay | Admitting: *Deleted

## 2013-10-03 NOTE — Telephone Encounter (Signed)
Patient would like the result of her blood work.

## 2013-10-03 NOTE — Telephone Encounter (Signed)
Called pt and lvm with results. Letter sent as well.

## 2013-10-14 ENCOUNTER — Encounter (HOSPITAL_COMMUNITY): Payer: Self-pay | Admitting: Emergency Medicine

## 2013-10-14 ENCOUNTER — Emergency Department (HOSPITAL_COMMUNITY)
Admission: EM | Admit: 2013-10-14 | Discharge: 2013-10-14 | Discharge status: Against Medical Advice | Payer: BC Managed Care – PPO | Attending: Emergency Medicine | Admitting: Emergency Medicine

## 2013-10-14 DIAGNOSIS — S61209A Unspecified open wound of unspecified finger without damage to nail, initial encounter: Secondary | ICD-10-CM | POA: Diagnosis not present
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
## 2013-10-14 NOTE — ED Notes (Signed)
She tells me she must leave at once "to deal with my kids that just got home from school".  I explain that it is very risky from an infection standpoint; and she assured me she will return as soon as possible today.  I explain that we will be very happy to treat her any time and look forward to doing so.

## 2013-10-19 DIAGNOSIS — S62660B Nondisplaced fracture of distal phalanx of right index finger, initial encounter for open fracture: Secondary | ICD-10-CM | POA: Insufficient documentation

## 2013-12-13 ENCOUNTER — Ambulatory Visit (INDEPENDENT_AMBULATORY_CARE_PROVIDER_SITE_OTHER): Payer: BC Managed Care – PPO | Admitting: Internal Medicine

## 2013-12-13 ENCOUNTER — Encounter: Payer: Self-pay | Admitting: Internal Medicine

## 2013-12-13 ENCOUNTER — Other Ambulatory Visit: Payer: BC Managed Care – PPO

## 2013-12-13 VITALS — BP 122/74 | HR 90 | Temp 98.0°F | Resp 12 | Wt 195.8 lb

## 2013-12-13 DIAGNOSIS — E890 Postprocedural hypothyroidism: Secondary | ICD-10-CM

## 2013-12-13 LAB — TSH: TSH: 74.84 u[IU]/mL — AB (ref 0.35–4.50)

## 2013-12-13 NOTE — Patient Instructions (Signed)
Please stop at the lab. Please take the thyroid hormone every day, with water, >30 minutes before breakfast, separated by >4 hours from acid reflux medications, calcium, iron, multivitamins.  Please come back for a follow-up appointment in 4 months.

## 2013-12-13 NOTE — Progress Notes (Signed)
Patient ID: Lacey SinghDawn S Mccarty, female   DOB: [birth year], 38 y.o.   MRN: [id number]   HPI  Lacey Mccarty is a 38 y.o.-year-old female, returning for followup for Graves Ds, dx 03/2012, now with iatrogenic hypothyroidism. This is an urgent appt for dizziness/lightheadedness. Last visit 8.5 mo ago!   She c/o of the following starting in the last 3 days: - dizziness - lightheadedness - losing her balance when stands up Feels better today.  She was on 2 courses of ABx for a root canal and also for a broken finger (bite). She stopped LT4 2 days ago to see if she would feel better.  She also c/o: - + knee pain - lost 10 lbs since last 2 mo - decreased appetite - no pbs with sleep - no numbness/tingling - + fatigue - + blurry vision - no anxiety/depression  Reviewed hx: She was dx in 03/2012, when she was admitted for asthma exacerbation. A CT scan of her chest performed on 03/22/2012 showed a thymic mass. Dr. Edwina BarthStephen Hendrickson was consulted and he suggested to check a TSH, which returned low. Retrospectively, she had heat intolerance x 2 mo; had fatigue; did not lose weight; no palpitations; no anxiety; no tremors. CT scan after resolution of Graves disease >> thymus size decreased.  Pt is on LT4 100 mcg daily, increased from 75 mcg at last visit, but she did not return for labs and at last visit, she was not very compliant with her LT4. She did not take it in the last 2 days.  When she takes it, she takes it: - with water - in am - >30 min before b'fast - not on antiacid medication, calcium, iron, MVI.  I reviewed pt's thyroid tests: Lab Results  Component Value Date   TSH 91.58* 10/02/2013   TSH 109.747* 03/19/2013   TSH 1.55 09/12/2012   TSH 0.80 08/30/2012   TSH 0.010* 03/22/2012   FREET4 0.26* 10/02/2013   FREET4 0.36* 03/19/2013   FREET4 0.59* 09/12/2012   FREET4 3.12* 03/22/2012  TSI 28  Reviewed imaging test reports: - 03/22/2012 chest CT: 5.6 x 5.0 x 3.6 cm thymic  mass, possible thymoma. Of note, her hCG and alpha-fetoprotein were not high - 06/09/2012: Thyroid uptake and scan 71.5% uptake, no nodules, pyramidal lobe present; uniform increased uptake suggesting Graves' disease - 07/27/2012: Radioactive iodine ablation with 13.1 mCi - 10/03/2012: Chest CT: 3.5 x 2.6 cm well circumscribed anterior mediastinal mass, previously 4.6 x 4.3 cm when measured in a similar fashion.   ROS: Constitutional: see above Eyes: +blurry vision, no xerophthalmia ENT: no sore throat, no nodules palpated in throat, no dysphagia/odynophagia, no hoarseness Cardiovascular: no CP/SOB/palpitations/leg swelling Respiratory: no cough/SOB/wheezing Gastrointestinal: no N/V/D/C Musculoskeletal: no muscle/joint aches Skin: no rashes Neurological: no tremors/numbness/tingling/+ dizziness  I reviewed pt's medications, allergies, PMH, social hx, family hx and no changes required, except as mentioned above.  PE: BP 122/74 mmHg  Pulse 90  Temp(Src) 98 F (36.7 C) (Oral)  Resp 12  Wt 195 lb 12.8 oz (88.814 kg)  SpO2 96% Body mass index is 33.59 kg/(m^2).  Wt Readings from Last 3 Encounters:  12/13/13 195 lb 12.8 oz (88.814 kg)  10/14/13 205 lb (92.987 kg)  05/01/13 198 lb (89.812 kg)   Constitutional: overweight, in NAD Eyes: PERRLA, EOMI, no exophthalmos, no lid lag, no stare ENT: moist mucous membranes, no thyromegaly, no thyroid bruits, no cervical lymphadenopathy Cardiovascular: RRR, No MRG Respiratory: CTA B Gastrointestinal: abdomen soft, NT, ND, BS+  Musculoskeletal: no deformities, strength intact in all 4 Skin: moist, warm, no rashes Neurological: no tremor with outstretched hands, DTR normal in all 4  ASSESSMENT: 1. Hypothyroidism - post ablative for Graves ds  - on levothyroxine 100 - noncompliant  2. Dizziness/lightheadedness  PLAN:  1. Patient with Luiz BlareGraves' disease, diagnosed last year, initially tried on methimazole, which she could not tolerate due to  hives. She had radioactive iodine ablation on 07/27/2012. Her TSH normalized, but then quickly became elevated, at 36. She was started on LT4 but TSH increased further >> 109 >> 91, due to noncompliance with the LT4 and with the appts.  - check TSH today. I will not check the fT4 as she did not take the LT4 or the last 2 days >> will be low. - discussed possible consequences of not taking the thyroid hh: weight gain, fluid retention, CHF, dementia, etc. - would ask pt to take it DAILY  - I advised her to join my chart to communicate easier  2. Dizziness/lightheadedness - in the last 2 days, now feels better - did not see PCP for it - BP normal - reviewed med list >> no offending agents other than narcotics for finger pain - advised to stay well hydrated - would like me to give her a letter that due to this condition, she cannot travel - I advised her that we can do that if TSH is abnormal, but there is no excuse for her not to have a normal TSH 1.5 years after the RAI tx, as she is treated with LT4. She needs to take it every day and come for labs as advised.  Office Visit on 12/13/2013  Component Date Value Ref Range Status  . TSH 12/13/2013 74.84* 0.35 - 4.50 uIU/mL Final  TSH still very high >> I will advise her again to not skip LT4 doses.   She needs to return for repeat thyroid tests in 5 weeks.  Will give her the traveling letter for now.

## 2014-03-18 ENCOUNTER — Encounter: Payer: Self-pay | Admitting: *Deleted

## 2014-03-20 ENCOUNTER — Other Ambulatory Visit: Payer: Self-pay | Admitting: *Deleted

## 2014-03-20 DIAGNOSIS — J9859 Other diseases of mediastinum, not elsewhere classified: Secondary | ICD-10-CM

## 2014-04-01 ENCOUNTER — Other Ambulatory Visit: Payer: Self-pay | Admitting: *Deleted

## 2014-04-01 ENCOUNTER — Telehealth: Payer: Self-pay | Admitting: *Deleted

## 2014-04-01 NOTE — Telephone Encounter (Signed)
Called pt and advised her to come in for labs to see what her levels are. Pt to come in tomorrow. Be advised.

## 2014-04-01 NOTE — Telephone Encounter (Signed)
Called pt and advised her per Dr Charlean SanfilippoGherghe's note that "Dr Elvera LennoxGherghe dose not expect your hypothyroidism to affect any part of your work." Pt was upset and said that it is. She is constantly sick, feels bad and is bleeding (unsure why she thinks this has to do with hypothyroidism). She asked for Dr Elvera LennoxGherghe to call her at #[phone]. Be advised.

## 2014-04-01 NOTE — Telephone Encounter (Signed)
Let's ask her to come back for labs >> TSH, fT4. If she takes the LT4 correctly, her TSH should be in the normal range or close to normal. Hypothyroidism should not impair her work ability if she is compliant with the regimen.

## 2014-04-02 ENCOUNTER — Other Ambulatory Visit (INDEPENDENT_AMBULATORY_CARE_PROVIDER_SITE_OTHER): Payer: Self-pay

## 2014-04-02 DIAGNOSIS — E890 Postprocedural hypothyroidism: Secondary | ICD-10-CM

## 2014-04-02 LAB — TSH: TSH: 54.98 u[IU]/mL — AB (ref 0.35–4.50)

## 2014-04-02 LAB — T4, FREE: FREE T4: 0.25 ng/dL — AB (ref 0.60–1.60)

## 2014-04-05 ENCOUNTER — Telehealth: Payer: Self-pay | Admitting: *Deleted

## 2014-04-05 MED ORDER — LEVOTHYROXINE SODIUM 125 MCG PO TABS: ORAL_TABLET | ORAL | Status: DC

## 2014-04-05 NOTE — Telephone Encounter (Signed)
Pt called requesting lab results. Advised pt per Dr Charlean SanfilippoGherghe's result note. New rx for 125 mcg levothyroxine sent to pt's pharmacy. Pt wants to know if Dr Elvera LennoxGherghe is going to fill out the paperwork for her now. Please advise.

## 2014-04-07 DIAGNOSIS — Z0279 Encounter for issue of other medical certificate: Secondary | ICD-10-CM

## 2014-04-07 NOTE — Telephone Encounter (Signed)
I filled out the parts that I was able to. Will give it to you on Monday.

## 2014-04-08 NOTE — Telephone Encounter (Signed)
Paperwork mailed out 04/08/14.

## 2014-04-15 ENCOUNTER — Ambulatory Visit: Payer: BC Managed Care – PPO | Admitting: Internal Medicine

## 2014-04-25 ENCOUNTER — Telehealth: Payer: Self-pay | Admitting: *Deleted

## 2014-04-30 ENCOUNTER — Other Ambulatory Visit: Payer: Self-pay

## 2014-04-30 ENCOUNTER — Ambulatory Visit: Payer: Self-pay | Admitting: Thoracic Surgery (Cardiothoracic Vascular Surgery)

## 2014-05-14 ENCOUNTER — Ambulatory Visit: Payer: Self-pay | Admitting: Thoracic Surgery (Cardiothoracic Vascular Surgery)

## 2014-05-14 ENCOUNTER — Other Ambulatory Visit: Payer: Self-pay

## 2014-10-24 ENCOUNTER — Telehealth: Payer: Self-pay | Admitting: Internal Medicine

## 2014-10-24 NOTE — Telephone Encounter (Signed)
Patient stated that she signed a release form to have her medical records six months ago and they have not received them. Please advise

## 2014-10-24 NOTE — Telephone Encounter (Signed)
Called pt and lvm advising her that medical records department has to release them. I advised her to contact the medical record dept at [phone].

## 2014-11-01 ENCOUNTER — Telehealth: Payer: Self-pay | Admitting: Internal Medicine

## 2014-11-01 NOTE — Telephone Encounter (Signed)
Returned pt's call (2 times) and advised her that we have not received any disability paper work from the [employer]. Pt stated that she spoke with the attorney and they said they faxed it. Advised her that they may have the incorrect fax number. Pt said she was unsure but she has an appt with Dr Elvera Lennox on the 27th, so she will up date the attorney on her condition after that.

## 2014-11-01 NOTE — Telephone Encounter (Signed)
Patient called and would like to know if Dr. Elvera Lennox had received her disability papers?  Please advise patient    Thank you

## 2014-11-05 ENCOUNTER — Encounter: Payer: Self-pay | Admitting: Internal Medicine

## 2014-11-05 ENCOUNTER — Ambulatory Visit (INDEPENDENT_AMBULATORY_CARE_PROVIDER_SITE_OTHER): Payer: Self-pay | Admitting: Internal Medicine

## 2014-11-05 VITALS — BP 104/62 | HR 71 | Temp 98.0°F | Resp 12 | Wt 195.0 lb

## 2014-11-05 DIAGNOSIS — E890 Postprocedural hypothyroidism: Secondary | ICD-10-CM

## 2014-11-05 DIAGNOSIS — Z8639 Personal history of other endocrine, nutritional and metabolic disease: Secondary | ICD-10-CM

## 2014-11-05 LAB — TSH: TSH: 66.82 u[IU]/mL — AB (ref 0.35–4.50)

## 2014-11-05 LAB — T4, FREE: FREE T4: 0.54 ng/dL — AB (ref 0.60–1.60)

## 2014-11-05 NOTE — Patient Instructions (Signed)
Please stop at the lab.  Please come back for a follow-up appointment in 6 months.  

## 2014-11-05 NOTE — Progress Notes (Signed)
Patient ID: Lacey Mccarty, female   DOB: [birth year], 39 y.o.   MRN: [id number]   HPI  Lacey Mccarty is a 39 y.o.-year-old female, returning for followup for Graves Ds, dx 03/2012, now with iatrogenic hypothyroidism. Last visit 10 mo ago!   Reviewed hx: She was dx in 03/2012, when she was admitted for asthma exacerbation. A CT scan of her chest performed on 03/22/2012 showed a thymic mass. Dr. Edwina Barth was consulted and he suggested to check a TSH, which returned low. Retrospectively, she had heat intolerance x 2 mo; had fatigue; did not lose weight; no palpitations; no anxiety; no tremors. CT scan after resolution of Graves disease >> thymus size decreased.  Pt is on LT4 125 mcg - previously not compliant with her LT4.   When she takes it, she takes it: - every am - with water - >30 min before b'fast - not on antiacid medication, calcium, iron, MVI.  She c/o - + fatigue, dizziness, loss of balance - decreased appetite - + blurry vision - no weight changes - + nausea in am or  mid-day - no pbs with sleep - no numbness/tingling - no anxiety/depression - regular meses  I reviewed pt's thyroid tests: Lab Results  Component Value Date   TSH 54.98* 04/02/2014   TSH 74.84* 12/13/2013   TSH 91.58* 10/02/2013   TSH 109.747* 03/19/2013   TSH 1.55 09/12/2012   TSH 0.80 08/30/2012   TSH 0.010* 03/22/2012   FREET4 0.25* 04/02/2014   FREET4 0.26* 10/02/2013   FREET4 0.36* 03/19/2013   FREET4 0.59* 09/12/2012   FREET4 3.12* 03/22/2012  TSI 28  Reviewed imaging test reports: - 03/22/2012 chest CT: 5.6 x 5.0 x 3.6 cm thymic mass, possible thymoma. Of note, her hCG and alpha-fetoprotein were not high - 06/09/2012: Thyroid uptake and scan 71.5% uptake, no nodules, pyramidal lobe present; uniform increased uptake suggesting Graves' disease - 07/27/2012: Radioactive iodine ablation with 13.1 mCi - 10/03/2012: Chest CT: 3.5 x 2.6 cm well circumscribed anterior mediastinal  mass, previously 4.6 x 4.3 cm when measured in a similar fashion.   ROS: Constitutional: see above Eyes: + blurry vision, no xerophthalmia ENT: no sore throat, no nodules palpated in throat, no dysphagia/odynophagia, no hoarseness Cardiovascular: no CP/SOB/palpitations/leg swelling Respiratory: no cough/SOB/wheezing Gastrointestinal: + N/no V/+ D/no C Musculoskeletal: no muscle/joint aches Skin: no rashes Neurological: no tremors/numbness/tingling/+ dizziness  I reviewed pt's medications, allergies, PMH, social hx, family hx, and changes were documented in the history of present illness. Otherwise, unchanged from my initial visit note.  PE: BP 104/62 mmHg  Pulse 71  Temp(Src) 98 F (36.7 C) (Oral)  Resp 12  Wt 195 lb (88.451 kg)  SpO2 97% Body mass index is 33.46 kg/(m^2).  Wt Readings from Last 3 Encounters:  11/05/14 195 lb (88.451 kg)  12/13/13 195 lb 12.8 oz (88.814 kg)  10/14/13 205 lb (92.987 kg)   Constitutional: overweight, in NAD Eyes: PERRLA, EOMI, no exophthalmos, no lid lag, no stare ENT: moist mucous membranes, no thyromegaly, no cervical lymphadenopathy Cardiovascular: RRR, No MRG Respiratory: CTA B Gastrointestinal: abdomen soft, NT, ND, BS+ Musculoskeletal: no deformities, strength intact in all 4 Skin: moist, warm, no rashes Neurological: no tremor with outstretched hands, DTR normal in all 4  ASSESSMENT: 1. Hypothyroidism - post ablative for Graves ds  - on levothyroxine 125  PLAN:  1. Patient with Luiz Blare' disease, diagnosed in 2014, initially tried on methimazole, which she could not tolerate due to hives. She had radioactive  iodine ablation on 07/27/2012. Her TSH normalized, but then quickly became elevated, at 36. She was started on LT4 but TSH increased further >> 109, 91, due to noncompliance with the LT4 and with the appts. Last TSH 55 7 mo ago >> she did not come for labs after this...  She is now stating that she is taking the medicine every day,  as advised: with water, >30 minutes before breakfast, separated by >4 hours from acid reflux medications, calcium, iron, multivitamins. - check TSH, fT4 today - will need a refill when results are back - continue LT4 125 mcg daily. - I advised her to join my chart to communicate easier  Office Visit on 11/05/2014  Component Date Value Ref Range Status  . TSH 11/05/2014 66.82* 0.35 - 4.50 uIU/mL Final  . Free T4 11/05/2014 0.54* 0.60 - 1.60 ng/dL Final   Her TSH is higher than before, and it appears that she is not taking her levothyroxine.I will send a slightly higher dose of levothyroxine: 137 micrograms to her pharmacy and recheck her thyroid tests in 6 weeks. At that time, if the TSH is not significantly lower, I am considering discharging from the practice since she is not compliant with the treatment recommended.

## 2014-11-06 ENCOUNTER — Other Ambulatory Visit: Payer: Self-pay | Admitting: *Deleted

## 2014-11-06 MED ORDER — LEVOTHYROXINE SODIUM 137 MCG PO TABS: 137.0000 ug | ORAL_TABLET | Freq: Every day | ORAL | Status: DC

## 2014-12-19 ENCOUNTER — Other Ambulatory Visit: Payer: Self-pay | Admitting: *Deleted

## 2014-12-19 ENCOUNTER — Other Ambulatory Visit (INDEPENDENT_AMBULATORY_CARE_PROVIDER_SITE_OTHER): Payer: Self-pay

## 2014-12-19 DIAGNOSIS — E890 Postprocedural hypothyroidism: Secondary | ICD-10-CM

## 2014-12-19 LAB — T4, FREE: Free T4: 0.41 ng/dL — ABNORMAL LOW (ref 0.60–1.60)

## 2014-12-19 LAB — TSH: TSH: 64.19 u[IU]/mL — ABNORMAL HIGH (ref 0.35–4.50)

## 2014-12-23 ENCOUNTER — Telehealth: Payer: Self-pay | Admitting: Internal Medicine

## 2014-12-23 NOTE — Telephone Encounter (Signed)
Pt requesting lab results please. 

## 2014-12-23 NOTE — Telephone Encounter (Signed)
Pt called requesting lab results. Please advise.  

## 2014-12-23 NOTE — Telephone Encounter (Signed)
See results note. 

## 2014-12-24 ENCOUNTER — Other Ambulatory Visit: Payer: Self-pay | Admitting: *Deleted

## 2014-12-24 ENCOUNTER — Encounter: Payer: Self-pay | Admitting: Internal Medicine

## 2014-12-24 MED ORDER — LEVOTHYROXINE SODIUM 175 MCG PO TABS: 175.0000 ug | ORAL_TABLET | Freq: Every day | ORAL | Status: DC

## 2014-12-24 NOTE — Telephone Encounter (Signed)
Called pt and lvm advising her per Dr Charlean SanfilippoGherghe's result note. Lacey Mccarty spoke with pt and advised her as well. New dose of 175 mcg of levothyroxine has been sent to pt's pharmacy.

## 2015-01-29 ENCOUNTER — Other Ambulatory Visit: Payer: Self-pay | Admitting: *Deleted

## 2015-01-29 ENCOUNTER — Telehealth: Payer: Self-pay | Admitting: Internal Medicine

## 2015-01-29 DIAGNOSIS — E890 Postprocedural hypothyroidism: Secondary | ICD-10-CM

## 2015-01-29 NOTE — Telephone Encounter (Signed)
Please read message below and advise.  

## 2015-01-29 NOTE — Telephone Encounter (Signed)
Called pt and lvm advising her per Dr Charlean SanfilippoGherghe's message below. Advised her to call at her convenience to schedule a lab appt. Ordered labs.

## 2015-01-29 NOTE — Telephone Encounter (Signed)
Pt is heavy bleeding, dizzy, fatique, has been going on for a while,

## 2015-01-29 NOTE — Telephone Encounter (Signed)
Sorry to hear that! Please contact PCP. She also needs a new set of thyroid labs as it has been ~ 1 mo since the previous. I expect these to be better if she was compliant with her LT4.

## 2015-02-12 ENCOUNTER — Other Ambulatory Visit (INDEPENDENT_AMBULATORY_CARE_PROVIDER_SITE_OTHER): Payer: Self-pay

## 2015-02-12 DIAGNOSIS — E890 Postprocedural hypothyroidism: Secondary | ICD-10-CM

## 2015-02-12 LAB — TSH: TSH: 86.09 u[IU]/mL — ABNORMAL HIGH (ref 0.35–4.50)

## 2015-02-12 LAB — T4, FREE: FREE T4: 0.13 ng/dL — AB (ref 0.60–1.60)

## 2015-02-13 ENCOUNTER — Encounter: Payer: Self-pay | Admitting: Internal Medicine

## 2015-02-14 ENCOUNTER — Telehealth: Payer: Self-pay | Admitting: Internal Medicine

## 2015-02-14 ENCOUNTER — Emergency Department (HOSPITAL_COMMUNITY)
Admission: EM | Admit: 2015-02-14 | Discharge: 2015-02-14 | Disposition: A | Discharge status: Home / Self Care | Payer: BLUE CROSS/BLUE SHIELD | Attending: Emergency Medicine | Admitting: Emergency Medicine

## 2015-02-14 ENCOUNTER — Encounter: Payer: Self-pay | Admitting: *Deleted

## 2015-02-14 ENCOUNTER — Encounter (HOSPITAL_COMMUNITY): Payer: Self-pay

## 2015-02-14 ENCOUNTER — Emergency Department (HOSPITAL_COMMUNITY): Payer: BLUE CROSS/BLUE SHIELD

## 2015-02-14 DIAGNOSIS — Z87891 Personal history of nicotine dependence: Secondary | ICD-10-CM | POA: Insufficient documentation

## 2015-02-14 DIAGNOSIS — G8929 Other chronic pain: Secondary | ICD-10-CM | POA: Diagnosis not present

## 2015-02-14 DIAGNOSIS — R197 Diarrhea, unspecified: Secondary | ICD-10-CM | POA: Diagnosis not present

## 2015-02-14 DIAGNOSIS — R5382 Chronic fatigue, unspecified: Secondary | ICD-10-CM | POA: Insufficient documentation

## 2015-02-14 DIAGNOSIS — Z3202 Encounter for pregnancy test, result negative: Secondary | ICD-10-CM | POA: Insufficient documentation

## 2015-02-14 DIAGNOSIS — J45909 Unspecified asthma, uncomplicated: Secondary | ICD-10-CM | POA: Diagnosis not present

## 2015-02-14 DIAGNOSIS — R05 Cough: Secondary | ICD-10-CM | POA: Insufficient documentation

## 2015-02-14 DIAGNOSIS — Z79899 Other long term (current) drug therapy: Secondary | ICD-10-CM | POA: Diagnosis not present

## 2015-02-14 DIAGNOSIS — R55 Syncope and collapse: Secondary | ICD-10-CM | POA: Diagnosis present

## 2015-02-14 DIAGNOSIS — E039 Hypothyroidism, unspecified: Secondary | ICD-10-CM | POA: Diagnosis not present

## 2015-02-14 LAB — BASIC METABOLIC PANEL
Anion gap: 7 (ref 5–15)
BUN: 20 mg/dL (ref 6–20)
CALCIUM: 8.9 mg/dL (ref 8.9–10.3)
CO2: 27 mmol/L (ref 22–32)
CREATININE: 0.8 mg/dL (ref 0.44–1.00)
Chloride: 104 mmol/L (ref 101–111)
GFR calc Af Amer: >60 mL/min (ref >60)
GLUCOSE: 76 mg/dL (ref 65–99)
POTASSIUM: 3.6 mmol/L (ref 3.5–5.1)
SODIUM: 138 mmol/L (ref 135–145)

## 2015-02-14 LAB — PROTIME-INR
INR: 0.97 (ref 0.00–1.49)
Prothrombin Time: 13.1 seconds (ref 11.6–15.2)

## 2015-02-14 LAB — URINALYSIS, ROUTINE W REFLEX MICROSCOPIC
BILIRUBIN URINE: NEGATIVE
Glucose, UA: NEGATIVE mg/dL
Hgb urine dipstick: NEGATIVE
KETONES UR: NEGATIVE mg/dL
LEUKOCYTES UA: NEGATIVE
NITRITE: NEGATIVE
PROTEIN: NEGATIVE mg/dL
Specific Gravity, Urine: 1.026 (ref 1.005–1.030)
pH: 7 (ref 5.0–8.0)

## 2015-02-14 LAB — CBC
HEMATOCRIT: 31.4 % — AB (ref 36.0–46.0)
Hemoglobin: 10 g/dL — ABNORMAL LOW (ref 12.0–15.0)
MCH: 26 pg (ref 26.0–34.0)
MCHC: 31.8 g/dL (ref 30.0–36.0)
MCV: 81.6 fL (ref 78.0–100.0)
PLATELETS: 317 10*3/uL (ref 150–400)
RBC: 3.85 MIL/uL — ABNORMAL LOW (ref 3.87–5.11)
RDW: 15.6 % — AB (ref 11.5–15.5)
WBC: 4.3 10*3/uL (ref 4.0–10.5)

## 2015-02-14 LAB — CBG MONITORING, ED: GLUCOSE-CAPILLARY: 92 mg/dL (ref 65–99)

## 2015-02-14 LAB — FIBRINOGEN: FIBRINOGEN: 355 mg/dL (ref 204–475)

## 2015-02-14 LAB — TSH

## 2015-02-14 LAB — APTT: aPTT: 32 seconds (ref 24–37)

## 2015-02-14 LAB — POC URINE PREG, ED: PREG TEST UR: NEGATIVE

## 2015-02-14 MED ORDER — SODIUM CHLORIDE 0.9 % IV BOLUS (SEPSIS)
1000.0000 mL | Freq: Once | INTRAVENOUS | Status: AC
  Administered 2015-02-14: 1000 mL via INTRAVENOUS

## 2015-02-14 MED ORDER — DEXAMETHASONE SODIUM PHOSPHATE 10 MG/ML IJ SOLN
10.0000 mg | Freq: Once | INTRAMUSCULAR | Status: AC
  Administered 2015-02-14: 10 mg via INTRAVENOUS
  Filled 2015-02-14: qty 1

## 2015-02-14 MED ORDER — DIPHENHYDRAMINE HCL 50 MG/ML IJ SOLN
25.0000 mg | Freq: Once | INTRAMUSCULAR | Status: AC
Start: 2015-02-14 — End: 2015-02-14
  Administered 2015-02-14: 25 mg via INTRAVENOUS
  Filled 2015-02-14: qty 1

## 2015-02-14 MED ORDER — METOCLOPRAMIDE HCL 5 MG/ML IJ SOLN
10.0000 mg | Freq: Once | INTRAMUSCULAR | Status: AC
Start: 2015-02-14 — End: 2015-02-14
  Administered 2015-02-14: 10 mg via INTRAVENOUS
  Filled 2015-02-14: qty 2

## 2015-02-14 NOTE — ED Notes (Signed)
Notified MD of decreased heart rate.  MD verbally ordered repeat EKG. Pt is currently asymptomatic despite decreased heart rate.

## 2015-02-14 NOTE — ED Notes (Signed)
Pt presents with c/o dizziness and near syncope. Pt reports that she has been dizzy off and on for approx a year, hx of thyroid problems. Pt reports that several days ago she passed out, did not hit her head, positive LOC.

## 2015-02-14 NOTE — ED Notes (Signed)
Pt cannot use restroom at this time, aware urine specimen is needed.  

## 2015-02-14 NOTE — Telephone Encounter (Signed)
Patient dismissed from [facility] by Carlus Pavlovristina Gherghe MD , effective February 13, 2015. Dismissal letter sent out by certified / registered mail.  DAJ  Received signed domestic return receipt verifying delivery of certified letter on February 19, 2015. Article number [id number] DAJ

## 2015-02-14 NOTE — ED Provider Notes (Signed)
CSN: [id number]     Arrival date & time 02/14/15  1236 History   First MD Initiated Contact with Patient 02/14/15 1623     Chief Complaint  Patient presents with  . Near Syncope     (Consider location/radiation/quality/duration/timing/severity/associated sxs/prior Treatment) Patient is a 40 y.o. female presenting with general illness.  Illness Quality:  Fatigue Severity:  Moderate Onset quality:  Gradual Duration:  14 months Timing:  Constant Progression:  Worsening Chronicity:  New Associated symptoms: cough, diarrhea and fatigue   Associated symptoms: no abdominal pain, no chest pain, no fever, no nausea, no rhinorrhea, no shortness of breath, no vomiting and no wheezing     Past Medical History  Diagnosis Date  . Asthma   . Graves disease     I-131 ablation late 07/2012  . Chronic back pain   . 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 Mediastinal mass    Past Surgical History  Procedure Laterality Date  . Liposuction    . Cesarean section    . Nm thyroid stim supress     Family History  Problem Relation Age of Onset  . HIV Mother   . Diabetes Mellitus II Mother   . Hypertension Father   . Allergies Child   . Allergies Child   . Colon cancer Paternal Grandmother   . Stomach cancer Paternal Grandmother   . Breast cancer Maternal Aunt   . Asthma Maternal Aunt    Social History  Substance Use Topics  . Smoking status: Former Smoker -- 0.25 packs/day for 7 years    Quit date: 09/14/2013  . Smokeless tobacco: Never Used  . Alcohol Use: Yes     Comment: occasional   OB History    No data available     Review of Systems  Constitutional: Positive for fatigue. Negative for fever.  HENT: Negative for rhinorrhea.   Respiratory: Positive for cough. Negative for shortness of breath and wheezing.   Cardiovascular: Negative for chest pain.  Gastrointestinal: Positive for diarrhea. Negative for nausea, vomiting and abdominal pain.  All other systems reviewed and are  negative.     Allergies  Tapazole  Home Medications   Prior to Admission medications   Medication Sig Start Date End Date Taking? Authorizing Provider  albuterol (PROVENTIL HFA;VENTOLIN HFA) 108 (90 BASE) MCG/ACT inhaler Inhale 1-2 puffs into the lungs every 6 (six) hours as needed for wheezing or shortness of breath. 02/17/13  Yes Graylon GoodZachary H Baker, PA-C  albuterol (PROVENTIL) (5 MG/ML) 0.5% nebulizer solution Take 0.5 mLs (2.5 mg total) by nebulization every 6 (six) hours as needed for wheezing or shortness of breath. 02/17/13  Yes Graylon GoodZachary H Baker, PA-C  levothyroxine (SYNTHROID, LEVOTHROID) 175 MCG tablet Take 1 tablet (175 mcg total) by mouth daily before breakfast. 12/24/14  Yes Carlus Pavlovristina Gherghe, MD  loratadine (CLARITIN) 10 MG tablet Take 10 mg by mouth daily as needed for allergies.   Yes Historical Provider, MD  montelukast (SINGULAIR) 10 MG tablet Take 1 tablet (10 mg total) by mouth at bedtime. Patient not taking: Reported on 02/14/2015 12/18/12   Waymon Budgelinton D Young, MD   BP 120/71 mmHg  Pulse 65  Temp(Src) 97.8 F (36.6 C) (Oral)  Resp 16  SpO2 100%  LMP 01/31/2015 (Approximate) Physical Exam  Constitutional: She is oriented to person, place, and time. She appears well-developed and well-nourished.  HENT:  Head: Normocephalic and atraumatic.  Neck: Normal range of motion.  Cardiovascular: Normal rate and regular rhythm.   Pulmonary/Chest: Effort normal.  No stridor. No respiratory distress.  Abdominal: Soft. She exhibits no distension. There is no tenderness.  Musculoskeletal: Normal range of motion. She exhibits no edema or tenderness.  Neurological: She is alert and oriented to person, place, and time. No cranial nerve deficit. Coordination normal.  Skin: Skin is warm and dry.  Nursing note and vitals reviewed.   ED Course  Procedures (including critical care time) Labs Review Labs Reviewed  CBC - Abnormal; Notable for the following:    RBC 3.85 (*)    Hemoglobin 10.0  (*)    HCT 31.4 (*)    RDW 15.6 (*)    All other components within normal limits  TSH - Abnormal; Notable for the following:    TSH >90.000 (*)    All other components within normal limits  BASIC METABOLIC PANEL  URINALYSIS, ROUTINE W REFLEX MICROSCOPIC (NOT AT [facility])  PROTIME-INR  APTT  FIBRINOGEN  CBG MONITORING, ED  POC URINE PREG, ED    Imaging Review Ct Head Wo Contrast  02/14/2015  CLINICAL DATA:  Frontal headache for 3 days.  No known injury. EXAM: CT HEAD WITHOUT CONTRAST TECHNIQUE: Contiguous axial images were obtained from the base of the skull through the vertex without intravenous contrast. COMPARISON:  None. FINDINGS: The brain appears normal without hemorrhage, infarct, mass lesion, mass effect, midline shift or abnormal extra-axial fluid collection. No hydrocephalus or pneumocephalus. The calvarium is intact. Imaged paranasal sinuses and mastoid air cells are clear. IMPRESSION: Normal head CT. Electronically Signed   By: Drusilla Kanner M.D.   On: 02/14/2015 17:39   I have personally reviewed and evaluated these images and lab results as part of my medical decision-making.   EKG Interpretation   Date/Time:  Friday February 14 2015 18:22:11 EST Ventricular Rate:  48 PR Interval:  190 QRS Duration: 80 QT Interval:  510 QTC Calculation: 456 R Axis:   46 Text Interpretation:  Sinus bradycardia Low voltage, precordial leads  Nonspecific T abnormalities, lateral leads ED PHYSICIAN INTERPRETATION  AVAILABLE IN [facility] Confirmed by TEST, Record ([id number]) on 02/15/2015  9:46:34 AM      MDM   Final diagnoses:  Chronic fatigue  Hypothyroidism, unspecified hypothyroidism type   Sick and hypothyroidism for which she has not found a therapeutic dose yet. Recently seen for just and had her dose changed which she started 2 days ago. She is here complaining of symptoms that she's had on and off for the last year. She is mostly related to her energy level. She  significantly fatigue to the point she feels she didn't pass out. Did have 1 episode of syncope a couple weeks ago with no urgent or aura. On exam now patient's just sleepy. No obvious abnormalities. TSH is greater than 90 however she has had time for her new dose of levothyroxine to take effect yet. Not hypoglycemic or bradycardic or hypotensive to suggest myxedema. Patient is stable for discharge with PCP follow-up for further workup and also endocrinology follow-up for further dose adjustment.      Marily Memos, MD 02/15/15 1850

## 2015-02-18 ENCOUNTER — Encounter: Payer: Self-pay | Admitting: Internal Medicine

## 2015-02-18 ENCOUNTER — Ambulatory Visit (INDEPENDENT_AMBULATORY_CARE_PROVIDER_SITE_OTHER): Payer: BLUE CROSS/BLUE SHIELD | Admitting: Internal Medicine

## 2015-02-18 VITALS — BP 124/82 | HR 73 | Temp 98.3°F | Wt 196.5 lb

## 2015-02-18 DIAGNOSIS — J4520 Mild intermittent asthma, uncomplicated: Secondary | ICD-10-CM

## 2015-02-18 DIAGNOSIS — E890 Postprocedural hypothyroidism: Secondary | ICD-10-CM

## 2015-02-18 DIAGNOSIS — Z0100 Encounter for examination of eyes and vision without abnormal findings: Secondary | ICD-10-CM

## 2015-02-18 MED ORDER — ALBUTEROL SULFATE HFA 108 (90 BASE) MCG/ACT IN AERS: 1.0000 | INHALATION_SPRAY | Freq: Four times a day (QID) | RESPIRATORY_TRACT | Status: DC | PRN

## 2015-02-18 MED ORDER — MONTELUKAST SODIUM 10 MG PO TABS: 10.0000 mg | ORAL_TABLET | Freq: Every day | ORAL | Status: DC

## 2015-02-18 NOTE — Progress Notes (Signed)
Pre visit review using our clinic review tool, if applicable. No additional management support is needed unless otherwise documented below in the visit note. 

## 2015-02-18 NOTE — Patient Instructions (Signed)

## 2015-02-18 NOTE — Progress Notes (Signed)
Subjective:    Patient ID: Lacey Mccarty, female    DOB: [birth year], 40 y.o.   MRN: [id number]  HPI  Pt presents to the clinic today to follow up chronic conditions.   Allergies: Mainly to dogs. She takes Claritin and Singulair, but she is out of her medication.  Asthma: She is not taking Singulair and Albuterol because she ran out.  Hypothyroidism: s/p radioiodine therapy. She is on Synthroid 175 mcg, recently increased 1 week ago by Dr. Elvera LennoxGherghe. She presented to the ER 1/6 with c/o fatigue,, headache, dizzy, nauseated x 14 months. She had her TSH checked 4 days ago and it was > 90. They advised her to follow up with PCP/Endocrinology for further medication management. She reports Dr. Elvera LennoxGherghe recently dismissed her from the practice.  She does need a vision exam today, so that she can renew her drivers license.  Review of Systems      Past Medical History  Diagnosis Date  . Asthma   . Graves disease     I-131 ablation late 07/2012  . Chronic back pain   . 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 Mediastinal mass     Current Outpatient Prescriptions  Medication Sig Dispense Refill  . albuterol (PROVENTIL HFA;VENTOLIN HFA) 108 (90 BASE) MCG/ACT inhaler Inhale 1-2 puffs into the lungs every 6 (six) hours as needed for wheezing or shortness of breath. 1 Inhaler 0  . albuterol (PROVENTIL) (5 MG/ML) 0.5% nebulizer solution Take 0.5 mLs (2.5 mg total) by nebulization every 6 (six) hours as needed for wheezing or shortness of breath. 30 vial 2  . levothyroxine (SYNTHROID, LEVOTHROID) 175 MCG tablet Take 1 tablet (175 mcg total) by mouth daily before breakfast. 45 tablet 1  . loratadine (CLARITIN) 10 MG tablet Take 10 mg by mouth daily as needed for allergies.    . montelukast (SINGULAIR) 10 MG tablet Take 1 tablet (10 mg total) by mouth at bedtime. 30 tablet PRN   No current facility-administered medications for this visit.    Allergies  Allergen Reactions  . Tapazole [Methimazole]  Hives and Shortness Of Breath    Family History  Problem Relation Age of Onset  . HIV Mother   . Diabetes Mellitus II Mother   . Hypertension Father   . Allergies Child   . Allergies Child   . Colon cancer Paternal Grandmother   . Stomach cancer Paternal Grandmother   . Breast cancer Maternal Aunt   . Asthma Maternal Aunt     Social History   Social History  . Marital Status: Single    Spouse Name: N/A  . Number of Children: N/A  . Years of Education: N/A   Occupational History  . Not on file.   Social History Main Topics  . Smoking status: Former Smoker -- 0.25 packs/day for 7 years    Quit date: 09/14/2013  . Smokeless tobacco: Never Used  . Alcohol Use: Yes     Comment: occasional  . Drug Use: No  . Sexual Activity: Yes   Other Topics Concern  . Not on file   Social History Narrative   ** Merged History Encounter **      Regular exercise: no   Caffeine use: none         Constitutional: Pt reports fatigue. Denies fever, malaise, headache or abrupt weight changes.  Respiratory: Denies difficulty breathing, shortness of breath, cough or sputum production.   Cardiovascular: Denies chest pain, chest tightness, palpitations or swelling in the hands  or feet.  Gastrointestinal: Pt reports nausea. Denies abdominal pain, bloating, constipation, diarrhea or blood in the stool.  Skin: Denies redness, rashes, lesions or ulcercations.  Neurological: Pt reports dizziness. Denies difficulty with memory, difficulty with speech or problems with balance and coordination.  Psych: Denies anxiety, depression, SI/HI.  No other specific complaints in a complete review of systems (except as listed in HPI above).  Objective:   Physical Exam   BP 124/82 mmHg  Pulse 73  Temp(Src) 98.3 F (36.8 C) (Oral)  Wt 196 lb 8 oz (89.132 kg)  SpO2 98%  LMP 01/31/2015 (Approximate) Wt Readings from Last 3 Encounters:  02/18/15 196 lb 8 oz (89.132 kg)  11/05/14 195 lb (88.451 kg)    12/13/13 195 lb 12.8 oz (88.814 kg)    General: Appears her stated age, obese in NAD.  HEENT: Head: normal shape and size; Eyes: sclera white, no icterus, conjunctiva pink, PERRLA and EOMs intact;  Neck:  Neck supple, trachea midline. No masses, lumps or thyromegaly present.  Cardiovascular: Normal rate and rhythm. S1,S2 noted.  No murmur, rubs or gallops noted.  Pulmonary/Chest: Normal effort and positive vesicular breath sounds. No respiratory distress. No wheezes, rales or ronchi noted.  Abdomen: Soft and nontender. Normal bowel sounds. Neurological: Alert and oriented.    BMET    Component Value Date/Time   NA 138 02/14/2015 1328   K 3.6 02/14/2015 1328   CL 104 02/14/2015 1328   CO2 27 02/14/2015 1328   GLUCOSE 76 02/14/2015 1328   BUN 20 02/14/2015 1328   CREATININE 0.80 02/14/2015 1328   CALCIUM 8.9 02/14/2015 1328   GFRNONAA >60 02/14/2015 1328   GFRAA >60 02/14/2015 1328    Lipid Panel     Component Value Date/Time   CHOL 195 08/30/2012 1521   TRIG 56.0 08/30/2012 1521   HDL 62.60 08/30/2012 1521   CHOLHDL 3 08/30/2012 1521   VLDL 11.2 08/30/2012 1521   LDLCALC 121* 08/30/2012 1521    CBC    Component Value Date/Time   WBC 4.3 02/14/2015 1328   RBC 3.85* 02/14/2015 1328   HGB 10.0* 02/14/2015 1328   HCT 31.4* 02/14/2015 1328   PLT 317 02/14/2015 1328   MCV 81.6 02/14/2015 1328   MCH 26.0 02/14/2015 1328   MCHC 31.8 02/14/2015 1328   RDW 15.6* 02/14/2015 1328   LYMPHSABS 1.6 01/24/2013 0956   MONOABS 0.5 01/24/2013 0956   EOSABS 0.0 01/24/2013 0956   BASOSABS 0.0 01/24/2013 0956    Hgb A1C Lab Results  Component Value Date   HGBA1C 5.4 08/30/2012        Assessment & Plan:   Vision screening:  Snellen chart: 20/20, 20/15, 20/20 Form filled out as requested  RTC in 6 months for annual exam

## 2015-02-18 NOTE — Assessment & Plan Note (Addendum)
ER notes, labs and imaging reviewed She reports she is taking her Synthroid 175 mcg daily She wants a referral to another endocrinologist but she is not sure where she wants to go- she will call me when she figures this out She is due to recheck TSH and T4 in 4 weeks

## 2015-02-18 NOTE — Assessment & Plan Note (Signed)
Claritin, Singulair and Albuterol refilled today

## 2015-02-21 NOTE — Telephone Encounter (Signed)
Let's re-send it.

## 2015-02-24 ENCOUNTER — Ambulatory Visit: Payer: BLUE CROSS/BLUE SHIELD | Admitting: Internal Medicine

## 2015-02-24 ENCOUNTER — Encounter: Payer: BLUE CROSS/BLUE SHIELD | Admitting: Internal Medicine

## 2015-02-25 NOTE — Telephone Encounter (Signed)
This was a notice that the letter was sent via certified mail and it was received on 02/19/15.

## 2015-03-12 LAB — HM PAP SMEAR: HM PAP: NORMAL

## 2015-04-17 ENCOUNTER — Other Ambulatory Visit: Payer: Self-pay | Admitting: Internal Medicine

## 2015-05-05 ENCOUNTER — Ambulatory Visit: Payer: Self-pay | Admitting: Internal Medicine

## 2015-10-07 ENCOUNTER — Encounter (HOSPITAL_COMMUNITY): Payer: Self-pay | Admitting: *Deleted

## 2015-10-07 ENCOUNTER — Ambulatory Visit (HOSPITAL_COMMUNITY)
Admission: EM | Admit: 2015-10-07 | Discharge: 2015-10-07 | Disposition: A | Discharge status: Home / Self Care | Payer: BLUE CROSS/BLUE SHIELD | Attending: Family Medicine | Admitting: Family Medicine

## 2015-10-07 DIAGNOSIS — G44209 Tension-type headache, unspecified, not intractable: Secondary | ICD-10-CM

## 2015-10-07 MED ORDER — TRAZODONE HCL 50 MG PO TABS: 50.0000 mg | ORAL_TABLET | Freq: Every day | ORAL | 0 refills | Status: DC

## 2015-10-07 NOTE — ED Provider Notes (Signed)
[facility]    CSN: [id number] Arrival date & time: 10/07/15  1252  First Provider Contact:  First MD Initiated Contact with Patient 10/07/15 1311        History   Chief Complaint Chief Complaint  Patient presents with  . Headache    HPI Lacey Mccarty is a 40 y.o. female.   The history is provided by the patient.  Headache  Pain location:  Frontal Quality:  Dull Radiates to:  Does not radiate Timing:  Constant Progression:  Unchanged Chronicity:  New Similar to prior headaches: no   Context: emotional stress   Context comment:  Taking care of aunt who just had a stroke., not sleeping from stress and caregiver respons. Relieved by:  None tried Worsened by:  Nothing Ineffective treatments:  None tried Associated symptoms: no abdominal pain, no back pain, no blurred vision, no dizziness, no fever, no neck stiffness, no numbness, no photophobia and no weakness   Risk factors: insomnia     Past Medical History:  Diagnosis Date  . Asthma   . Chronic back pain   . Graves disease    I-131 ablation late 07/2012  . Mediastinal mass   . 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 Hypothyroidism following radioiodine therapy 10/11/2012  . Allergy-induced asthma 08/30/2012  . H/O Graves' disease 03/23/2012  . Tobacco abuse 03/21/2012  . Thymus hyperplasia (HCC) 03/11/2012    Past Surgical History:  Procedure Laterality Date  . CESAREAN SECTION    . LIPOSUCTION    . NM THYROID STIM SUPRESS      OB History    No data available       Home Medications    Prior to Admission medications   Medication Sig Start Date End Date Taking? Authorizing Provider  albuterol (PROVENTIL HFA;VENTOLIN HFA) 108 (90 Base) MCG/ACT inhaler Inhale 1-2 puffs into the lungs every 6 (six) hours as needed for wheezing or shortness of breath. 02/18/15   Lorre Munroeegina W Baity, NP  albuterol (PROVENTIL) (5 MG/ML) 0.5% nebulizer solution  Take 0.5 mLs (2.5 mg total) by nebulization every 6 (six) hours as needed for wheezing or shortness of breath. 02/17/13   Graylon GoodZachary H Baker, PA-C  levothyroxine (SYNTHROID, LEVOTHROID) 175 MCG tablet Take 1 tablet (175 mcg total) by mouth daily before breakfast. 12/24/14   Carlus Pavlovristina Gherghe, MD  loratadine (CLARITIN) 10 MG tablet Take 10 mg by mouth daily as needed for allergies.    Historical Provider, MD  montelukast (SINGULAIR) 10 MG tablet Take 1 tablet (10 mg total) by mouth at bedtime. 02/18/15   Lorre Munroeegina W Baity, NP    Family History Family History  Problem Relation Age of Onset  . HIV Mother   . Diabetes Mellitus II Mother   . Hypertension Father   . Allergies Child   . Allergies Child   . Colon cancer Paternal Grandmother   . Stomach cancer Paternal Grandmother   . Breast cancer Maternal Aunt   . Asthma Maternal Aunt     Social History Social History  Substance Use Topics  . Smoking status: Former Smoker    Packs/day: 0.25    Years: 7.00    Quit date: 09/14/2013  . Smokeless tobacco: Never Used  . Alcohol use Yes     Comment: occasional     Allergies   Tapazole [methimazole]   Review of Systems Review of Systems  Constitutional: Negative.  Negative for fever.  Eyes: Negative for blurred vision and photophobia.  Gastrointestinal: Negative for abdominal pain.  Musculoskeletal: Negative for back pain and neck stiffness.  Neurological: Positive for headaches. Negative for dizziness, weakness, light-headedness and numbness.  All other systems reviewed and are negative.    Physical Exam Triage Vital Signs ED Triage Vitals  Enc Vitals Group     BP      Pulse      Resp      Temp      Temp src      SpO2      Weight      Height      Head Circumference      Peak Flow      Pain Score      Pain Loc      Pain Edu?      Excl. in GC?    No data found.   Updated Vital Signs There were no vitals taken for this visit.  Visual Acuity Right Eye Distance:   Left  Eye Distance:   Bilateral Distance:    Right Eye Near:   Left Eye Near:    Bilateral Near:     Physical Exam  Constitutional: She is oriented to person, place, and time. She appears well-developed and well-nourished. No distress.  HENT:  Head: Normocephalic.  Right Ear: External ear normal.  Left Ear: External ear normal.  Mouth/Throat: Oropharynx is clear and moist.  Eyes: Conjunctivae and EOM are normal. Pupils are equal, round, and reactive to light.  Neck: Normal range of motion. Neck supple.  Cardiovascular: Normal rate and regular rhythm.   Pulmonary/Chest: Effort normal.  Lymphadenopathy:    She has no cervical adenopathy.  Neurological: She is alert and oriented to person, place, and time. No cranial nerve deficit. Coordination normal.  Skin: Skin is warm and dry.  Nursing note and vitals reviewed.    UC Treatments / Results  Labs (all labs ordered are listed, but only abnormal results are displayed) Labs Reviewed - No data to display  EKG  EKG Interpretation None       Radiology No results found.  Procedures Procedures (including critical care time)  Medications Ordered in UC Medications - No data to display   Initial Impression / Assessment and Plan / UC Course  I have reviewed the triage vital signs and the nursing notes.  Pertinent labs & imaging results that were available during my care of the patient were reviewed by me and considered in my medical decision making (see chart for details).  Clinical Course      Final Clinical Impressions(s) / UC Diagnoses   Final diagnoses:  None    New Prescriptions New Prescriptions   No medications on file     Linna Hoff, MD 10/07/15 1332

## 2015-10-07 NOTE — ED Triage Notes (Signed)
Pt   Reports  Yesterday   Her bp  Was  140/90    And  She  Had  A   Headache  Not  releived  By otc meds       Today it  Is  Better    She  Is  Awake  And  Alert  And  Appears  In no  Acute  Distress

## 2015-12-18 ENCOUNTER — Emergency Department (HOSPITAL_COMMUNITY): Payer: BLUE CROSS/BLUE SHIELD

## 2015-12-18 ENCOUNTER — Emergency Department (HOSPITAL_COMMUNITY)
Admission: EM | Admit: 2015-12-18 | Discharge: 2015-12-18 | Disposition: A | Discharge status: Home / Self Care | Payer: BLUE CROSS/BLUE SHIELD | Attending: Emergency Medicine | Admitting: Emergency Medicine

## 2015-12-18 ENCOUNTER — Encounter (HOSPITAL_COMMUNITY): Payer: Self-pay | Admitting: Emergency Medicine

## 2015-12-18 DIAGNOSIS — Z87891 Personal history of nicotine dependence: Secondary | ICD-10-CM | POA: Insufficient documentation

## 2015-12-18 DIAGNOSIS — J4520 Mild intermittent asthma, uncomplicated: Secondary | ICD-10-CM

## 2015-12-18 DIAGNOSIS — J4521 Mild intermittent asthma with (acute) exacerbation: Secondary | ICD-10-CM

## 2015-12-18 DIAGNOSIS — E039 Hypothyroidism, unspecified: Secondary | ICD-10-CM | POA: Insufficient documentation

## 2015-12-18 MED ORDER — ALBUTEROL SULFATE (5 MG/ML) 0.5% IN NEBU: 2.5000 mg | INHALATION_SOLUTION | Freq: Four times a day (QID) | RESPIRATORY_TRACT | 2 refills | Status: DC | PRN

## 2015-12-18 MED ORDER — ALBUTEROL SULFATE HFA 108 (90 BASE) MCG/ACT IN AERS: 1.0000 | INHALATION_SPRAY | Freq: Four times a day (QID) | RESPIRATORY_TRACT | 2 refills | Status: DC | PRN

## 2015-12-18 MED ORDER — LEVOTHYROXINE SODIUM 175 MCG PO TABS: 175.0000 ug | ORAL_TABLET | Freq: Every day | ORAL | 1 refills | Status: DC

## 2015-12-18 MED ORDER — PREDNISONE 20 MG PO TABS: ORAL_TABLET | ORAL | 0 refills | Status: DC

## 2015-12-18 MED ORDER — PREDNISONE 20 MG PO TABS
60.0000 mg | ORAL_TABLET | Freq: Once | ORAL | Status: AC
  Administered 2015-12-18: 60 mg via ORAL
  Filled 2015-12-18: qty 3

## 2015-12-18 MED ORDER — ALBUTEROL SULFATE (2.5 MG/3ML) 0.083% IN NEBU
INHALATION_SOLUTION | RESPIRATORY_TRACT | Status: AC
  Filled 2015-12-18: qty 3

## 2015-12-18 MED ORDER — ALBUTEROL SULFATE (2.5 MG/3ML) 0.083% IN NEBU
5.0000 mg | INHALATION_SOLUTION | Freq: Once | RESPIRATORY_TRACT | Status: AC
  Administered 2015-12-18: 5 mg via RESPIRATORY_TRACT

## 2015-12-18 NOTE — ED Notes (Signed)
Pt. Ambulated with pulse Ox and O2 sats remained at 100%

## 2015-12-18 NOTE — ED Provider Notes (Signed)
[facility] Note   CSN: [id number] Arrival date & time: 12/18/15  0946     History   Chief Complaint Chief Complaint  Patient presents with  . Asthma    HPI Lacey Mccarty is a 40 y.o. female.  HPI   40 year old female with history of asthma, chronic back pain, Graves' disease presented ED with complaints of shortness of breath. Patient states since last night she has increased shortness of breath and wheezing that is similar to her asthma attack. She felt that the symptoms likely due to weather changes. She tries using her albuterol rescue inhaler as well for at home with minimal improvement. Since being in the ER she has had a total of 4 breathing treatment and now feeling a bit better. She denies having fever, chills, URI symptoms, productive cough, hemoptysis, nausea vomiting diarrhea, or rash. She report that she ran out of her thyroid medication for approximate one week and because of that she is having increased generalized fatigue. Her insurance has labs and she is waiting for her insurance to kick back in later this month so she can follow-up with her primary care provider. She is requesting for medication refill.  Past Medical History:  Diagnosis Date  . Asthma   . Chronic back pain   . Graves disease    I-131 ablation late 07/2012  . Mediastinal mass   . 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 Hypothyroidism following radioiodine therapy 10/11/2012  . Allergy-induced asthma 08/30/2012  . H/O Graves' disease 03/23/2012  . Tobacco abuse 03/21/2012  . Thymus hyperplasia (HCC) 03/11/2012    Past Surgical History:  Procedure Laterality Date  . CESAREAN SECTION    . LIPOSUCTION    . NM THYROID STIM SUPRESS      OB History    No data available       Home Medications    Prior to Admission medications   Medication Sig Start Date End Date Taking? Authorizing Provider  albuterol (PROVENTIL  HFA;VENTOLIN HFA) 108 (90 Base) MCG/ACT inhaler Inhale 1-2 puffs into the lungs every 6 (six) hours as needed for wheezing or shortness of breath. 02/18/15   Lorre Munroeegina W Baity, NP  albuterol (PROVENTIL) (5 MG/ML) 0.5% nebulizer solution Take 0.5 mLs (2.5 mg total) by nebulization every 6 (six) hours as needed for wheezing or shortness of breath. 02/17/13   Graylon GoodZachary H Baker, PA-C  levothyroxine (SYNTHROID, LEVOTHROID) 175 MCG tablet Take 1 tablet (175 mcg total) by mouth daily before breakfast. 12/24/14   Carlus Pavlovristina Gherghe, MD  loratadine (CLARITIN) 10 MG tablet Take 10 mg by mouth daily as needed for allergies.    Historical Provider, MD  montelukast (SINGULAIR) 10 MG tablet Take 1 tablet (10 mg total) by mouth at bedtime. 02/18/15   Lorre Munroeegina W Baity, NP  traZODone (DESYREL) 50 MG tablet Take 1 tablet (50 mg total) by mouth at bedtime. 10/07/15   Linna HoffJames D Kindl, MD    Family History Family History  Problem Relation Age of Onset  . HIV Mother   . Diabetes Mellitus II Mother   . Hypertension Father   . Allergies Child   . Allergies Child   . Colon cancer Paternal Grandmother   . Stomach cancer Paternal Grandmother   . Breast cancer Maternal Aunt   . Asthma Maternal Aunt     Social History Social History  Substance Use Topics  . Smoking status: Former Smoker  Packs/day: 0.25    Years: 7.00    Quit date: 09/14/2013  . Smokeless tobacco: Never Used  . Alcohol use Yes     Comment: occasional     Allergies   Tapazole [methimazole]   Review of Systems Review of Systems  All other systems reviewed and are negative.    Physical Exam Updated Vital Signs BP 118/90 (BP Location: Right Arm)   Pulse 80   Temp 98.2 F (36.8 C) (Oral)   Resp 22   Ht 5\' 3"  (1.6 m)   Wt 83.9 kg   LMP 12/10/2015 (Exact Date)   SpO2 97%   BMI 32.77 kg/m   Physical Exam  Constitutional: She appears well-developed and well-nourished. No distress.  HENT:  Head: Atraumatic.  Right Ear: External ear normal.    Left Ear: External ear normal.  Mouth/Throat: Oropharynx is clear and moist.  Eyes: Conjunctivae are normal.  Neck: Neck supple. No thyromegaly present.  Cardiovascular: Normal rate and regular rhythm.   Pulmonary/Chest: Effort normal and breath sounds normal. No respiratory distress. She has no wheezes. She has no rales.  Abdominal: Soft. There is no tenderness.  Musculoskeletal: She exhibits no edema.  Neurological: She is alert.  Skin: No rash noted.  Psychiatric: She has a normal mood and affect.  Nursing note and vitals reviewed.    ED Treatments / Results  Labs (all labs ordered are listed, but only abnormal results are displayed) Labs Reviewed - No data to display  EKG  EKG Interpretation None       Radiology Dg Chest 2 View  Result Date: 12/18/2015 CLINICAL DATA:  Asthma and wheezing intact this morning. History of asthma. EXAM: CHEST  2 VIEW COMPARISON:  01/24/2013 FINDINGS: The heart size and mediastinal contours are within normal limits. Both lungs are clear. No pleural effusion or pneumothorax. The visualized skeletal structures are unremarkable. IMPRESSION: No active cardiopulmonary disease. Electronically Signed   By: Amie Portland M.D.   On: 12/18/2015 10:53    Procedures Procedures (including critical care time)  Medications Ordered in ED Medications  albuterol (PROVENTIL) (2.5 MG/3ML) 0.083% nebulizer solution (not administered)  albuterol (PROVENTIL) (2.5 MG/3ML) 0.083% nebulizer solution (not administered)  albuterol (PROVENTIL) (2.5 MG/3ML) 0.083% nebulizer solution 5 mg (5 mg Nebulization Given 12/18/15 1013)     Initial Impression / Assessment and Plan / ED Course  I have reviewed the triage vital signs and the nursing notes.  Pertinent labs & imaging results that were available during my care of the patient were reviewed by me and considered in my medical decision making (see chart for details).  Clinical Course     BP 100/67   Pulse 61    Temp 98 F (36.7 C) (Oral)   Resp 17   Ht 5\' 3"  (1.6 m)   Wt 83.9 kg   LMP 12/10/2015 (Exact Date)   SpO2 100%   BMI 32.77 kg/m    Final Clinical Impressions(s) / ED Diagnoses   Final diagnoses:  Mild intermittent asthma with exacerbation    New Prescriptions New Prescriptions   No medications on file   11:43 AM Patient here with increased shortness of breath and wheezing consistence with an asthma exacerbation. She does not have any significant complication with the history of asthma such as intubation or ICU stay. Her wheezing has improved after receiving breathing treatment. Chest x-ray without focal infiltrate concerning for pneumonia. Patient given steroid and will refill her asthma medication as well as her thyroid medication.  Fayrene HelperBowie Aliviya Schoeller, PA-C 12/18/15 1315    Arby BarretteMarcy Pfeiffer, MD 12/23/15 [phone]

## 2015-12-18 NOTE — ED Notes (Signed)
Ambulated pt with o2 sat at 100%. Pt ambulated self efficiently with no difficulty.

## 2015-12-18 NOTE — ED Notes (Signed)
Papers reviewed with patient and she verbalizes understanding. Leaving ambulatory, sts that breathing is better than when she came in

## 2015-12-18 NOTE — ED Triage Notes (Signed)
Pt from home with c/o asthma exacerbation starting this morning.  Pt reports taking 3 neb treatments this morning PTA without relief.  Lungs clear, audible wheezing from throat area with a cough.  NAD, A&O.

## 2015-12-25 ENCOUNTER — Other Ambulatory Visit: Payer: Self-pay | Admitting: Internal Medicine

## 2016-03-02 ENCOUNTER — Other Ambulatory Visit: Payer: Self-pay | Admitting: Internal Medicine

## 2016-03-02 ENCOUNTER — Ambulatory Visit (INDEPENDENT_AMBULATORY_CARE_PROVIDER_SITE_OTHER): Payer: BLUE CROSS/BLUE SHIELD | Admitting: Internal Medicine

## 2016-03-02 ENCOUNTER — Encounter: Payer: Self-pay | Admitting: Internal Medicine

## 2016-03-02 VITALS — BP 118/70 | HR 82 | Temp 98.1°F | Wt 191.0 lb

## 2016-03-02 DIAGNOSIS — R112 Nausea with vomiting, unspecified: Secondary | ICD-10-CM

## 2016-03-02 DIAGNOSIS — M79642 Pain in left hand: Secondary | ICD-10-CM

## 2016-03-02 DIAGNOSIS — R232 Flushing: Secondary | ICD-10-CM | POA: Diagnosis not present

## 2016-03-02 DIAGNOSIS — E039 Hypothyroidism, unspecified: Secondary | ICD-10-CM | POA: Diagnosis not present

## 2016-03-02 DIAGNOSIS — I8390 Asymptomatic varicose veins of unspecified lower extremity: Secondary | ICD-10-CM

## 2016-03-02 DIAGNOSIS — R197 Diarrhea, unspecified: Secondary | ICD-10-CM

## 2016-03-02 DIAGNOSIS — M79641 Pain in right hand: Secondary | ICD-10-CM | POA: Diagnosis not present

## 2016-03-02 LAB — TSH: TSH: 6.7 u[IU]/mL — ABNORMAL HIGH (ref 0.35–4.50)

## 2016-03-02 LAB — T4, FREE: FREE T4: 0.88 ng/dL (ref 0.60–1.60)

## 2016-03-02 MED ORDER — LEVOTHYROXINE SODIUM 200 MCG PO TABS: 200.0000 ug | ORAL_TABLET | Freq: Every day | ORAL | 2 refills | Status: DC

## 2016-03-02 NOTE — Progress Notes (Signed)
Subjective:    Patient ID: Lacey Mccarty, female    DOB: [birth year], 41 y.o.   MRN: [id number]  HPI  Pt presents to the clinic today with c/o nausea, vomiting and diarrhea. This started 2 days ago. She reports she only vomited x 1. She reports the diarrhea was more loose stool. She denies abdominal pain, cramping, or blood in her stool. She reports her symptoms have resolved now. She did not taken anything OTC for this.  She also c/o bilateral hand pain. Mainly in her thumbs. She describes the pain as aching. It is worse when she holds her cell phone and tries to text. She denies any injury or swelling to the area. She has not taken anything OTC for this.  She also c/o left leg pain. She reports she noticed a small bump on her left leg. It is tender to touch but she has not noticed any redness or warmth . She does not recall hitting her leg on anything. She is concerned that she may have a blood clot.   Hypothyroidism:  She reports she ran out of her Synthroid 3 days ago. She is taking a friend's Synthroid, same dose. She denies fatigue, weight gain, constipation, dry skin or depression.   She also c/o hot flashes. These only occur at night. She thinks she is menopausal even though she is having regular periods. She would like her levels checked today.   Review of Systems      Past Medical History:  Diagnosis Date  . Asthma   . Chronic back pain   . Graves disease    I-131 ablation late 07/2012  . Mediastinal mass   . 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 albuterol (PROVENTIL HFA;VENTOLIN HFA) 108 (90 Base) MCG/ACT inhaler Inhale 1-2 puffs into the lungs every 6 (six) hours as needed for wheezing or shortness of breath. 1 Inhaler 2  . albuterol (PROVENTIL) (5 MG/ML) 0.5% nebulizer solution Take 0.5 mLs (2.5 mg total) by nebulization every 6 (six) hours as needed for wheezing or shortness of breath. 30 vial 2  .  levothyroxine (SYNTHROID, LEVOTHROID) 175 MCG tablet TAKE 1 TABLET(175 MCG) BY MOUTH DAILY BEFORE BREAKFAST 45 tablet 0  . montelukast (SINGULAIR) 10 MG tablet Take 1 tablet (10 mg total) by mouth at bedtime. 30 tablet 2  . predniSONE (DELTASONE) 20 MG tablet 2 tabs po daily x 4 days 8 tablet 0  . traZODone (DESYREL) 50 MG tablet Take 1 tablet (50 mg total) by mouth at bedtime. 20 tablet 0   No current facility-administered medications for this visit.     Allergies  Allergen Reactions  . Tapazole [Methimazole] Hives and Shortness Of Breath    Family History  Problem Relation Age of Onset  . HIV Mother   . Diabetes Mellitus II Mother   . Hypertension Father   . Allergies Child   . Allergies Child   . Colon cancer Paternal Grandmother   . Stomach cancer Paternal Grandmother   . Breast cancer Maternal Aunt   . Asthma Maternal Aunt     Social History   Social History  . Marital status: Single    Spouse name: N/A  . Number of children: N/A  . Years of education: N/A   Occupational History  . Not on file.   Social History Main Topics  . Smoking status: Former Smoker    Packs/day: 0.25    Years: 7.00  Quit date: 09/14/2013  . Smokeless tobacco: Never Used  . Alcohol use Yes     Comment: occasional  . Drug use: No  . Sexual activity: Yes   Other Topics Concern  . Not on file   Social History Narrative   ** Merged History Encounter **      Regular exercise: no   Caffeine use: none         Constitutional: Denies fever, malaise, fatigue, headache or abrupt weight changes.  Gastrointestinal: Pt reports nausea, vomiting and diarrhea. Denies abdominal pain, bloating, constipation, or blood in the stool.  Musculoskeletal: Pt reports joint pain in hands. Denies decrease in range of motion, difficulty with gait, muscle pain or joint swelling.  Skin: Pt reports knot on left leg. Denies redness, rashes, or ulcercations.    No other specific complaints in a complete  review of systems (except as listed in HPI above).  Objective:   Physical Exam   BP 118/70   Pulse 82   Temp 98.1 F (36.7 C) (Oral)   Wt 191 lb (86.6 kg)   LMP 02/23/2016   SpO2 98%   BMI 33.83 kg/m  Wt Readings from Last 3 Encounters:  03/02/16 191 lb (86.6 kg)  12/18/15 185 lb (83.9 kg)  02/18/15 196 lb 8 oz (89.1 kg)    General: Appears her stated age, in NAD. Skin: Varicose vein noted on left lateral leg. No redness or warmth. Abdomen: Soft and nontender. Normal bowel sounds. No distention or masses noted.  Musculoskeletal: Normal flexion and extension of bilateral thumbs. No swelling noted. Pain with palpation over the MCP.  BMET    Component Value Date/Time   NA 138 02/14/2015 1328   K 3.6 02/14/2015 1328   CL 104 02/14/2015 1328   CO2 27 02/14/2015 1328   GLUCOSE 76 02/14/2015 1328   BUN 20 02/14/2015 1328   CREATININE 0.80 02/14/2015 1328   CALCIUM 8.9 02/14/2015 1328   GFRNONAA >60 02/14/2015 1328   GFRAA >60 02/14/2015 1328    Lipid Panel     Component Value Date/Time   CHOL 195 08/30/2012 1521   TRIG 56.0 08/30/2012 1521   HDL 62.60 08/30/2012 1521   CHOLHDL 3 08/30/2012 1521   VLDL 11.2 08/30/2012 1521   LDLCALC 121 (H) 08/30/2012 1521    CBC    Component Value Date/Time   WBC 4.3 02/14/2015 1328   RBC 3.85 (L) 02/14/2015 1328   HGB 10.0 (L) 02/14/2015 1328   HCT 31.4 (L) 02/14/2015 1328   PLT 317 02/14/2015 1328   MCV 81.6 02/14/2015 1328   MCH 26.0 02/14/2015 1328   MCHC 31.8 02/14/2015 1328   RDW 15.6 (H) 02/14/2015 1328   LYMPHSABS 1.6 01/24/2013 0956   MONOABS 0.5 01/24/2013 0956   EOSABS 0.0 01/24/2013 0956   BASOSABS 0.0 01/24/2013 0956    Hgb A1C Lab Results  Component Value Date   HGBA1C 5.4 08/30/2012           Assessment & Plan:   Nausea, vomiting and loose stool:  Likely viral Symptoms have resolved Advised her to wash hands thoroughly and clean the bathroom thoroughly  Bilateral hand pain:  Likely  arthritis/tendonitis Aleve as needed She is concerned about RA Will check RF and ANA today  Varicose Vein:  Can try a warm compress Compression hose may help No further intervention is needed  Hypothyroidism:  Will check TSH and T4 today Will refill Synthroid based on labs  Hot flashes:  FSH/LH today  Try [place] OTC   RTC as needed or if symptoms persist or worsen Yurem Viner, NP

## 2016-03-03 ENCOUNTER — Telehealth: Payer: Self-pay | Admitting: Internal Medicine

## 2016-03-03 LAB — FOLLICLE STIMULATING HORMONE: FSH: 13.8 m[IU]/mL

## 2016-03-03 LAB — LUTEINIZING HORMONE: LH: 20.73 m[IU]/mL

## 2016-03-03 NOTE — Telephone Encounter (Signed)
Pt request cb regarding labs

## 2016-03-03 NOTE — Patient Instructions (Signed)
Varicose Veins Varicose veins are veins that have become enlarged and twisted. They are usually seen in the legs but can occur in other parts of the body as well. CAUSES This condition is the result of valves in the veins not working properly. Valves in the veins help to return blood from the leg to the heart. If these valves are damaged, blood flows backward and backs up into the veins in the leg near the skin. This causes the veins to become larger. RISK FACTORS People who are on their feet a lot, who are pregnant, or who are overweight are more likely to develop varicose veins. SIGNS AND SYMPTOMS  Bulging, twisted-appearing, bluish veins, most commonly found on the legs.  Leg pain or a feeling of heaviness. These symptoms may be worse at the end of the day.  Leg swelling.  Changes in skin color. DIAGNOSIS A health care provider can usually diagnose varicose veins by examining your legs. Your health care provider may also recommend an ultrasound of your leg veins. TREATMENT Most varicose veins can be treated at home.However, other treatments are available for people who have persistent symptoms or want to improve the cosmetic appearance of the varicose veins. These treatment options include:  Sclerotherapy. A solution is injected into the vein to close it off.  Laser treatment. A laser is used to heat the vein to close it off.  Radiofrequency vein ablation. An electrical current produced by radio waves is used to close off the vein.  Phlebectomy. The vein is surgically removed through small incisions made over the varicose vein.  Vein ligation and stripping. The vein is surgically removed through incisions made over the varicose vein after the vein has been tied (ligated). HOME CARE INSTRUCTIONS   Do not stand or sit in one position for long periods of time. Do not sit with your legs crossed. Rest with your legs raised during the day.  Wear compression stockings as directed by  your health care provider. These stockings help to prevent blood clots and reduce swelling in your legs.  Do not wear other tight, encircling garments around your legs, pelvis, or waist.  Walk as much as possible to increase blood flow.  Raise the foot of your bed at night with 2-inch blocks.  If you get a cut in the skin over the vein and the vein bleeds, lie down with your leg raised and press on it with a clean cloth until the bleeding stops. Then place a bandage (dressing) on the cut. See your health care provider if it continues to bleed. SEEK MEDICAL CARE IF:  The skin around your ankle starts to break down.  You have pain, redness, tenderness, or hard swelling in your leg over a vein.  You are uncomfortable because of leg pain. This information is not intended to replace advice given to you by your health care provider. Make sure you discuss any questions you have with your health care provider. Document Released: 11/04/2004 Document Revised: 05/19/2015 Document Reviewed: 06/12/2013 Elsevier Interactive Patient Education  2017 Elsevier Inc.  

## 2016-03-04 ENCOUNTER — Other Ambulatory Visit: Payer: Self-pay | Admitting: Internal Medicine

## 2016-03-04 DIAGNOSIS — M2550 Pain in unspecified joint: Secondary | ICD-10-CM

## 2016-03-04 DIAGNOSIS — R768 Other specified abnormal immunological findings in serum: Secondary | ICD-10-CM

## 2016-03-04 LAB — RHEUMATOID FACTOR: Rhuematoid fact SerPl-aCnc: <14 IU/mL (ref <14)

## 2016-03-04 LAB — ANA: Anti Nuclear Antibody(ANA): POSITIVE — AB

## 2016-03-04 LAB — ANTI-NUCLEAR AB-TITER (ANA TITER): ANA Titer 1: 1:320 {titer} — ABNORMAL HIGH

## 2016-03-10 ENCOUNTER — Emergency Department (HOSPITAL_COMMUNITY)
Admission: EM | Admit: 2016-03-10 | Discharge: 2016-03-10 | Disposition: A | Discharge status: Home / Self Care | Payer: BLUE CROSS/BLUE SHIELD | Attending: Emergency Medicine | Admitting: Emergency Medicine

## 2016-03-10 ENCOUNTER — Encounter (HOSPITAL_COMMUNITY): Payer: Self-pay | Admitting: Emergency Medicine

## 2016-03-10 DIAGNOSIS — M791 Myalgia: Secondary | ICD-10-CM | POA: Diagnosis not present

## 2016-03-10 DIAGNOSIS — Z87891 Personal history of nicotine dependence: Secondary | ICD-10-CM | POA: Diagnosis not present

## 2016-03-10 DIAGNOSIS — E039 Hypothyroidism, unspecified: Secondary | ICD-10-CM | POA: Insufficient documentation

## 2016-03-10 DIAGNOSIS — Z79899 Other long term (current) drug therapy: Secondary | ICD-10-CM | POA: Insufficient documentation

## 2016-03-10 DIAGNOSIS — R52 Pain, unspecified: Secondary | ICD-10-CM | POA: Diagnosis present

## 2016-03-10 DIAGNOSIS — J45909 Unspecified asthma, uncomplicated: Secondary | ICD-10-CM | POA: Insufficient documentation

## 2016-03-10 DIAGNOSIS — G8929 Other chronic pain: Secondary | ICD-10-CM | POA: Diagnosis not present

## 2016-03-10 MED ORDER — NAPROXEN 500 MG PO TABS: 500.0000 mg | ORAL_TABLET | Freq: Two times a day (BID) | ORAL | 0 refills | Status: DC

## 2016-03-10 MED ORDER — TRAMADOL HCL 50 MG PO TABS
50.0000 mg | ORAL_TABLET | Freq: Once | ORAL | Status: AC
  Administered 2016-03-10: 50 mg via ORAL
  Filled 2016-03-10: qty 1

## 2016-03-10 NOTE — Discharge Instructions (Signed)
Take your medication as prescribed. I recommend following up with your primary care provider and your rheumatologist at your scheduled appointment for further management of your chronic pain. Return to the emergency department if symptoms worsen or new onset of fever, chest pain, difficulty breathing, abdominal pain, vomiting, loss of control of bowel or bladder, groin numbness, numbness, tingling, weakness.

## 2016-03-10 NOTE — ED Triage Notes (Signed)
genernal all over pain for  Months, saw the dr and  Was told she may need specialist , has been taking otc MEDS NOT HELPING , NO INJURY , JUST NEEDS PAIN MEDS

## 2016-03-10 NOTE — ED Provider Notes (Signed)
[facility]-EMERGENCY DEPT Provider Note   CSN: [id number] Arrival date & time: 03/10/16  1220  By signing my name below, I, Lacey Mccarty, attest that this documentation has been prepared under the direction and in the presence of Lacey Hake, PA-C . Electronically Signed: Majel Mccarty, Scribe. 03/10/2016. 1:23 PM.  History   Chief Complaint No chief complaint on file.  The history is provided by the patient. No language interpreter was used.   HPI Comments: Lacey Mccarty is a 41 y.o. female with PMHx of asthma, chronic back pain, Grave's Disease, and thymus hyperplasia, who presents to the Emergency Department complaining of gradually worsening, generalized pain that "has been going on for months." Pt reports her pain is centralized to her thumbs, knees, and "all of her joints." She notes her pain is exacerbated when walking and states associated difficulty sleeping due to her pain. Pt reports she has taken Motrin and Aleve for her pain with no relief. She notes she visited her PCP, Dr. Sampson Si, on 03/02/16 for an evaluation of similar pain in which she was tested for Lupus and was told her results were positive for ANA. She states she received a phone call this morning saying a rheumatologist would be in contact with her but she has not set up an appointment yet. Pt denies fever, recent injury or trauma, CP, SOB, abdominal pain, nausea, vomiting, diarrhea, numbness or weakness, joint swelling or redness.   Past Medical History:  Diagnosis Date  . Asthma   . Chronic back pain   . Graves disease    I-131 ablation late 07/2012  . Mediastinal mass   . 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 Hypothyroidism following radioiodine therapy 10/11/2012  . Allergy-induced asthma 08/30/2012  . H/O Graves' disease 03/23/2012  . Tobacco abuse 03/21/2012  . Thymus hyperplasia (HCC) 03/11/2012    Past Surgical History:  Procedure Laterality Date  . CESAREAN  SECTION    . LIPOSUCTION    . NM THYROID STIM SUPRESS      OB History    No data available     Home Medications    Prior to Admission medications   Medication Sig Start Date End Date Taking? Authorizing Provider  levothyroxine (SYNTHROID, LEVOTHROID) 200 MCG tablet Take 1 tablet (200 mcg total) by mouth daily before breakfast. 03/02/16   Lorre Munroe, NP  montelukast (SINGULAIR) 10 MG tablet Take 1 tablet (10 mg total) by mouth at bedtime. Patient not taking: Reported on 03/02/2016 02/18/15   Lorre Munroe, NP  naproxen (NAPROSYN) 500 MG tablet Take 1 tablet (500 mg total) by mouth 2 (two) times daily. 03/10/16   Barrett Henle, PA-C    Family History Family History  Problem Relation Age of Onset  . HIV Mother   . Diabetes Mellitus II Mother   . Hypertension Father   . Allergies Child   . Allergies Child   . Colon cancer Paternal Grandmother   . Stomach cancer Paternal Grandmother   . Breast cancer Maternal Aunt   . Asthma Maternal Aunt     Social History Social History  Substance Use Topics  . Smoking status: Former Smoker    Packs/day: 0.25    Years: 7.00    Quit date: 09/14/2013  . Smokeless tobacco: Never Used  . Alcohol use Yes     Comment: occasional     Allergies   Tapazole [methimazole]   Review of Systems  Review of Systems  Constitutional: Negative for fever.  Gastrointestinal: Negative for abdominal pain, diarrhea, nausea and vomiting.  Musculoskeletal: Positive for arthralgias. Negative for joint swelling.  Skin: Negative for color change.  Neurological: Negative for weakness and numbness.  Psychiatric/Behavioral: Positive for sleep disturbance (due to pain).   Physical Exam Updated Vital Signs BP 116/71 (BP Location: Left Arm)   Pulse 82   Temp 98.3 F (36.8 C) (Oral)   Resp 16   LMP 02/23/2016   SpO2 99%   Physical Exam  Constitutional: She is oriented to person, place, and time. She appears well-developed and well-nourished.    HENT:  Head: Normocephalic and atraumatic.  Mouth/Throat: Oropharynx is clear and moist. No oropharyngeal exudate.  Eyes: Conjunctivae and EOM are normal. Pupils are equal, round, and reactive to light. Right eye exhibits no discharge. Left eye exhibits no discharge. No scleral icterus.  Neck: Normal range of motion. Neck supple.  Cardiovascular: Normal rate, regular rhythm, normal heart sounds and intact distal pulses.   Pulmonary/Chest: Effort normal and breath sounds normal. No respiratory distress. She has no wheezes. She has no rales. She exhibits no tenderness.  Abdominal: Soft. Bowel sounds are normal. She exhibits no distension and no mass. There is no tenderness. There is no rebound and no guarding. No hernia.  Musculoskeletal: Normal range of motion. She exhibits no edema, tenderness or deformity.  No midline C, T, or L tenderness. Full range of motion of neck and back. Full range of motion of bilateral upper and lower extremities, with 5/5 strength. Sensation intact. 2+ radial and PT pulses. Cap refill <2 seconds. Patient able to stand and ambulate without assistance.   Neurological: She is alert and oriented to person, place, and time.  Skin: Skin is warm and dry.  Nursing note and vitals reviewed.  ED Treatments / Results  Labs (all labs ordered are listed, but only abnormal results are displayed) Labs Reviewed - No data to display  EKG  EKG Interpretation None       Radiology No results found.  Procedures Procedures (including critical care time)  Medications Ordered in ED Medications  traMADol (ULTRAM) tablet 50 mg (not administered)    DIAGNOSTIC STUDIES:  Oxygen Saturation is 99% on RA, normal by my interpretation.    COORDINATION OF CARE:  1:02 PM Discussed treatment plan with pt at bedside and pt agreed to plan.  Initial Impression / Assessment and Plan / ED Course  I have reviewed the triage vital signs and the nursing notes.  Pertinent labs &  imaging results that were available during my care of the patient were reviewed by me and considered in my medical decision making (see chart for details).     Patient presents requesting pain medication for her chronic pain. She reports having pain all over. She notes she was seen by her PCP last week and reports having a positive ANA. Patient is in the process of setting up an appointment for evaluation with rheumatology. Denies any recent fall, trauma or injury. Denies fever. VSS. Exam unremarkable. Bilateral upper and lower extremities neurovascularly intact. Patient able to stand and ambulate. Discussed with patient that she needs to follow up with her PCP and rheumatology for management of her chronic pain. Discussed that pt will get single dose of pain meds in the ED and be d/c home with NSAIDs. Discussed symptomatic treatment. Advised patient to follow up at her scheduled appointment with her PCP and dermatologist. Discussed return precautions.  I personally performed  the services described in this documentation, which was scribed in my presence. The recorded information has been reviewed and is accurate.   Final Clinical Impressions(s) / ED Diagnoses   Final diagnoses:  Other chronic pain    New Prescriptions New Prescriptions   NAPROXEN (NAPROSYN) 500 MG TABLET    Take 1 tablet (500 mg total) by mouth 2 (two) times daily.     Satira Sarkicole Elizabeth NicolletNadeau, [place] JerseyPA-C 03/10/16 1327    Charlynne Panderavid Hsienta Yao, MD 03/10/16 [phone]

## 2016-03-12 ENCOUNTER — Ambulatory Visit (INDEPENDENT_AMBULATORY_CARE_PROVIDER_SITE_OTHER): Payer: BLUE CROSS/BLUE SHIELD | Admitting: Family Medicine

## 2016-03-12 ENCOUNTER — Telehealth: Payer: Self-pay | Admitting: Internal Medicine

## 2016-03-12 ENCOUNTER — Telehealth: Payer: Self-pay

## 2016-03-12 ENCOUNTER — Encounter: Payer: Self-pay | Admitting: Internal Medicine

## 2016-03-12 ENCOUNTER — Encounter: Payer: Self-pay | Admitting: Family Medicine

## 2016-03-12 VITALS — BP 112/78 | HR 92 | Temp 98.4°F | Ht 63.0 in | Wt 189.0 lb

## 2016-03-12 DIAGNOSIS — M2550 Pain in unspecified joint: Secondary | ICD-10-CM | POA: Diagnosis not present

## 2016-03-12 MED ORDER — INDOMETHACIN ER 75 MG PO CPCR: 75.0000 mg | ORAL_CAPSULE | Freq: Two times a day (BID) | ORAL | 0 refills | Status: DC

## 2016-03-12 MED ORDER — PREDNISONE 10 MG PO TABS: ORAL_TABLET | ORAL | 0 refills | Status: DC

## 2016-03-12 NOTE — Telephone Encounter (Signed)
[facility] Client TELEPHONE ADVICE RECORD [facility] Patient Name: Lacey HousekeeperDAWN Mccarty DOB: [birth year] Initial Comment Caller states c/o widespread pain, Rx's are not helping. Nurse Assessment Nurse: Debera Latalston, RN, Tinnie GensJeffrey Date/Time Lamount Cohen(Eastern Time): 03/12/2016 1:03:21 PM Confirm and document reason for call. If symptomatic, describe symptoms. ---Caller states c/o widespread pain, OTC are not helping. Joint pain. No fever. Does the patient have any new or worsening symptoms? ---Yes Will a triage be completed? ---Yes Related visit to physician within the last 2 weeks? ---Yes Does the PT have any chronic conditions? (i.e. diabetes, asthma, etc.) ---Yes List chronic conditions. ---hypothyroid Is the patient pregnant or possibly pregnant? (Ask all females between the ages of 1812-55) ---No Is this a behavioral health or substance abuse call? ---No Guidelines Guideline Title Affirmed Question Affirmed Notes Knee Pain [1] SEVERE pain (e.g., excruciating, unable to walk) AND [2] not improved after 2 hours of pain medicine Final Disposition User See Physician within 4 Hours (or PCP triage) Debera Latalston, RN, Tinnie GensJeffrey Referrals REFERRED TO PCP OFFICE Disagree/Comply: Danella Maiersomply

## 2016-03-12 NOTE — Telephone Encounter (Deleted)
-----   Message from Lorre Munroeegina W Baity, NP sent at 03/12/2016  3:59 PM EST -----   ----- Message ----- From: Nelwyn SalisburyStephen A Fry, MD Sent: 03/12/2016   3:01 PM To: Lorre Munroeegina W Baity, NP

## 2016-03-12 NOTE — Telephone Encounter (Signed)
Pt being seen by Dr Clent RidgesFry today.

## 2016-03-12 NOTE — Progress Notes (Signed)
I need you to call her. I called in Prednisone for her this am. She saw Dr. Clent RidgesFry and he prescribed Indomethacin. She can not take this or Ibuprofen, Naproxen with Prednisone, which I already advised her in the email message this am. See email from earlier today. She has already been set up with rheumatology for follow up.

## 2016-03-12 NOTE — Telephone Encounter (Signed)
-----   Message from Regina W Baity, NP sent at 03/12/2016  3:59 PM EST -----   ----- Message ----- From: Stephen A Fry, MD Sent: 03/12/2016   3:01 PM To: Regina W Baity, NP   

## 2016-03-12 NOTE — Telephone Encounter (Signed)
Author: Lorre Munroeegina W Baity, NP Author Type: Nurse Practitioner Filed: 03/12/2016 3:59 PM  Note Status: Signed Cosign: Cosign Not Required Encounter Date: 03/12/2016 2:00 PM  Editor: Lorre Munroeegina W Baity, NP (Nurse Practitioner)    I need you to call her. I called in Prednisone for her this am. She saw Dr. Clent RidgesFry and he prescribed Indomethacin. She can not take this or Ibuprofen, Naproxen with Prednisone, which I already advised her in the email message this am. See email from earlier today. She has already been set up with rheumatology for follow up.     Spoke to patient and explained to her that she must not take Aleve or ibuprofen with the prednisone--I also advised her to not pick up the indomethacin as she cannot take it right now...advised pt that she needed to start prednisone and take as instructed...will have to wait for additional testing possible with Rheum.... Pt expressed understanding and will only take Pred taper

## 2016-03-12 NOTE — Progress Notes (Signed)
   Subjective:    Patient ID: Lacey Mccarty, female    DOB: [birth year], 41 y.o.   MRN: [id number]  HPI Here asking for pain medication. She sees Ovidio Hangeregina Beatty as her PCP, but for some reason an appt was made with us today. She has been complaining of diffuse joint pains for months but it has been getting worse lately. She saw her PCP on 03-02-16 for this and was told to take Aleve. This did not help. She went to the ER on 03-10-16 and was given Naproxen, and this did not help. Her PCP has referred her to Rheumatology and she will see them on 04-05-16. Earlier today she was prescribed a course of Prednisone, but she has not picked this up at the pharmacy yet. She says the pain is terrible and she is getting frustrated.    Review of Systems  Constitutional: Negative.   Respiratory: Negative.   Cardiovascular: Negative.   Musculoskeletal: Positive for arthralgias. Negative for joint swelling and myalgias.       Objective:   Physical Exam  Constitutional: She appears well-developed and well-nourished.  Cardiovascular: Normal rate, regular rhythm, normal heart sounds and intact distal pulses.   Pulmonary/Chest: Effort normal and breath sounds normal.  Musculoskeletal:  Her hands appear normal but she is tender in both wrists and at the bases of both thumbs          Assessment & Plan:  Diffuse arthralgias of uncertain etiology. I told her I was not comfortable prescribing narcotics to her when she already has a PCP. We will send in Indomethacin 75 mg to try. Se will follow up with Ovidio Hangeregina Beatty and Rheumatology.  Gershon CraneStephen Fry, MD

## 2016-03-16 NOTE — Telephone Encounter (Signed)
error 

## 2016-03-19 ENCOUNTER — Ambulatory Visit (INDEPENDENT_AMBULATORY_CARE_PROVIDER_SITE_OTHER): Payer: BLUE CROSS/BLUE SHIELD | Admitting: Internal Medicine

## 2016-03-19 ENCOUNTER — Encounter: Payer: Self-pay | Admitting: Internal Medicine

## 2016-03-19 VITALS — BP 108/76 | HR 76 | Temp 98.4°F | Ht 63.0 in | Wt 192.0 lb

## 2016-03-19 DIAGNOSIS — Z0000 Encounter for general adult medical examination without abnormal findings: Secondary | ICD-10-CM | POA: Diagnosis not present

## 2016-03-19 DIAGNOSIS — E039 Hypothyroidism, unspecified: Secondary | ICD-10-CM

## 2016-03-19 LAB — CBC
HEMATOCRIT: 33.7 % — AB (ref 36.0–46.0)
HEMOGLOBIN: 11.1 g/dL — AB (ref 12.0–15.0)
MCHC: 32.9 g/dL (ref 30.0–36.0)
MCV: 88.5 fl (ref 78.0–100.0)
Platelets: 276 10*3/uL (ref 150.0–400.0)
RBC: 3.8 Mil/uL — AB (ref 3.87–5.11)
RDW: 14.6 % (ref 11.5–15.5)
WBC: 4.3 10*3/uL (ref 4.0–10.5)

## 2016-03-19 LAB — COMPREHENSIVE METABOLIC PANEL
ALBUMIN: 4 g/dL (ref 3.5–5.2)
ALK PHOS: 56 U/L (ref 39–117)
ALT: 9 U/L (ref 0–35)
AST: 12 U/L (ref 0–37)
BILIRUBIN TOTAL: 0.5 mg/dL (ref 0.2–1.2)
BUN: 14 mg/dL (ref 6–23)
CO2: 30 mEq/L (ref 19–32)
CREATININE: 0.69 mg/dL (ref 0.40–1.20)
Calcium: 9 mg/dL (ref 8.4–10.5)
Chloride: 105 mEq/L (ref 96–112)
GFR: 119.38 mL/min (ref >60.00)
GLUCOSE: 82 mg/dL (ref 70–99)
Potassium: 3.8 mEq/L (ref 3.5–5.1)
SODIUM: 138 meq/L (ref 135–145)
Total Protein: 7.1 g/dL (ref 6.0–8.3)

## 2016-03-19 LAB — LIPID PANEL
CHOLESTEROL: 170 mg/dL (ref 0–200)
HDL: 52.8 mg/dL (ref >39.00)
LDL Cholesterol: 104 mg/dL — ABNORMAL HIGH (ref 0–99)
NONHDL: 117.6
Total CHOL/HDL Ratio: 3
Triglycerides: 70 mg/dL (ref 0.0–149.0)
VLDL: 14 mg/dL (ref 0.0–40.0)

## 2016-03-19 NOTE — Progress Notes (Signed)
Subjective:    Patient ID: Lacey Mccarty, female    DOB: 26-Aug-1975, 41 y.o.   MRN: [id number]  HPI  Pt presents to the clinic today for her annual exam.  Flu: never Tetanus: She thinks she had it in 2016 after a bite [facility] Pap Smear: 03/2015- normal Mammogram: never, but scheduled next week through GYN Vision Screening: annually Dentist: annually  Diet: She does eat meat. She consumes fruits and veggies daily. She does eat some fried food. She drinks some juice but mostly water. Exercise: None  She also wants to discuss her joint pain. I advised her that because she was 10 minutes late for her appt, I could not discuss this with her today.  Review of Systems      Past Medical History:  Diagnosis Date  . Asthma   . Chronic back pain   . Graves disease    I-131 ablation late 07/2012  . Mediastinal mass   . 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 indomethacin (INDOCIN SR) 75 MG CR capsule Take 1 capsule (75 mg total) by mouth 2 (two) times daily with a meal. 30 capsule 0  . levothyroxine (SYNTHROID, LEVOTHROID) 200 MCG tablet Take 1 tablet (200 mcg total) by mouth daily before breakfast. 30 tablet 2  . montelukast (SINGULAIR) 10 MG tablet Take 1 tablet (10 mg total) by mouth at bedtime. 30 tablet 2  . naproxen (NAPROSYN) 500 MG tablet Take 1 tablet (500 mg total) by mouth 2 (two) times daily. 30 tablet 0  . predniSONE (DELTASONE) 10 MG tablet Take 3 tabs on days 1-3, take 2 tabs on days 4-6, take 1 tab on days 7-9 (Patient not taking: Reported on 03/12/2016) 18 tablet 0   No current facility-administered medications for this visit.     Allergies  Allergen Reactions  . Tapazole [Methimazole] Hives and Shortness Of Breath    Family History  Problem Relation Age of Onset  . HIV Mother   . Diabetes Mellitus II Mother   . Hypertension Father   . Allergies Child   . Allergies Child     . Colon cancer Paternal Grandmother   . Stomach cancer Paternal Grandmother   . Breast cancer Maternal Aunt   . Asthma Maternal Aunt     Social History   Social History  . Marital status: Single    Spouse name: N/A  . Number of children: N/A  . Years of education: N/A   Occupational History  . Not on file.   Social History Main Topics  . Smoking status: Former Smoker    Packs/day: 0.25    Years: 7.00    Quit date: 09/14/2013  . Smokeless tobacco: Never Used  . Alcohol use Yes     Comment: occasional  . Drug use: No  . Sexual activity: Yes   Other Topics Concern  . Not on file   Social History Narrative   ** Merged History Encounter **      Regular exercise: no   Caffeine use: none         Constitutional: Denies fever, malaise, fatigue, headache or abrupt weight changes.  HEENT: Denies eye pain, eye redness, ear pain, ringing in the ears, wax buildup, runny nose, nasal congestion, bloody nose, or sore throat. Respiratory: Denies difficulty breathing, shortness of breath, cough or sputum production.   Cardiovascular: Denies chest pain, chest tightness, palpitations or swelling in the  hands or feet.  Gastrointestinal: Denies abdominal pain, bloating, constipation, diarrhea or blood in the stool.  GU: Denies urgency, frequency, pain with urination, burning sensation, blood in urine, odor or discharge. Musculoskeletal: Pt reports joint pain. Denies difficulty with gait, muscle pain or joint swelling.  Skin: Denies redness, rashes, lesions or ulcercations.  Neurological: Denies dizziness, difficulty with memory, difficulty with speech or problems with balance and coordination.  Psych: Denies anxiety, depression, SI/HI.  No other specific complaints in a complete review of systems (except as listed in HPI above).  Objective:   Physical Exam   BP 108/76   Pulse 76   Temp 98.4 F (36.9 C) (Oral)   Ht 5\' 3"  (1.6 m)   Wt 192 lb (87.1 kg)   LMP 03/14/2016   SpO2  99%   BMI 34.01 kg/m  Wt Readings from Last 3 Encounters:  03/19/16 192 lb (87.1 kg)  03/12/16 189 lb (85.7 kg)  03/02/16 191 lb (86.6 kg)    General: Appears her stated age, obese in NAD. Skin: Warm, dry and intact.  HEENT: Head: normal shape and size; Eyes: sclera white, no icterus, conjunctiva pink, PERRLA and EOMs intact; Ears: Tm's gray and intact, normal light reflex; Throat/Mouth: Teeth present, mucosa pink and moist, no exudate, lesions or ulcerations noted.  Neck:  Neck supple, trachea midline. No masses, lumps, noted. Thyromegaly present.  Cardiovascular: Normal rate and rhythm. S1,S2 noted.  No murmur, rubs or gallops noted. No JVD or BLE edema.  Pulmonary/Chest: Normal effort and positive vesicular breath sounds. No respiratory distress. No wheezes, rales or ronchi noted.  Abdomen: Soft and nontender. Normal bowel sounds. No distention or masses noted. Liver, spleen and kidneys non palpable. Musculoskeletal: Strength 5/5 BUE/BLE. No difficulty with gait.  Neurological: Alert and oriented. Cranial nerves II-XII grossly intact. Coordination normal.  Psychiatric: Mood and affect normal. Behavior is normal. Judgment and thought content normal.    BMET    Component Value Date/Time   NA 138 02/14/2015 1328   K 3.6 02/14/2015 1328   CL 104 02/14/2015 1328   CO2 27 02/14/2015 1328   GLUCOSE 76 02/14/2015 1328   BUN 20 02/14/2015 1328   CREATININE 0.80 02/14/2015 1328   CALCIUM 8.9 02/14/2015 1328   GFRNONAA >60 02/14/2015 1328   GFRAA >60 02/14/2015 1328    Lipid Panel     Component Value Date/Time   CHOL 195 08/30/2012 1521   TRIG 56.0 08/30/2012 1521   HDL 62.60 08/30/2012 1521   CHOLHDL 3 08/30/2012 1521   VLDL 11.2 08/30/2012 1521   LDLCALC 121 (H) 08/30/2012 1521    CBC    Component Value Date/Time   WBC 4.3 02/14/2015 1328   RBC 3.85 (L) 02/14/2015 1328   HGB 10.0 (L) 02/14/2015 1328   HCT 31.4 (L) 02/14/2015 1328   PLT 317 02/14/2015 1328   MCV 81.6  02/14/2015 1328   MCH 26.0 02/14/2015 1328   MCHC 31.8 02/14/2015 1328   RDW 15.6 (H) 02/14/2015 1328   LYMPHSABS 1.6 01/24/2013 0956   MONOABS 0.5 01/24/2013 0956   EOSABS 0.0 01/24/2013 0956   BASOSABS 0.0 01/24/2013 0956    Hgb A1C Lab Results  Component Value Date   HGBA1C 5.4 08/30/2012           Assessment & Plan:   Preventative Health Maintenance:  She declines flu shot She reports her tetanus is UTD Pap smear and mammogram scheduled Encouraged her to consume a balanced diet and exercise regimen Advised  her to see an eye doctor and dentist annually  Will check CBC, CMET and Lipid Profile today  RTC in 1 year, sooner if you would like to discuss your pain Nicki Reaper, NP

## 2016-03-19 NOTE — Patient Instructions (Signed)

## 2016-03-22 NOTE — Addendum Note (Signed)
Addended by: Lorre MunroeBAITY, Anslee Micheletti W on: 03/22/2016 09:44 AM   Modules accepted: Orders

## 2016-11-02 ENCOUNTER — Telehealth: Payer: Self-pay | Admitting: Internal Medicine

## 2016-11-02 NOTE — Telephone Encounter (Signed)
Ok, is there somewhere else we can send her?

## 2016-11-02 NOTE — Telephone Encounter (Signed)
Received a call from [facility]. Your patient No Showed twice at their office so I dont think they will see her again or set her up there.

## 2016-11-05 NOTE — Telephone Encounter (Signed)
Spoke with the patient and she says she was having an issue with her insurance at Dr Jiles Crocker office but it is resolved now so she is going to call them to make another appointment.

## 2017-03-22 ENCOUNTER — Encounter: Payer: BLUE CROSS/BLUE SHIELD | Admitting: Internal Medicine

## 2017-03-22 DIAGNOSIS — Z0289 Encounter for other administrative examinations: Secondary | ICD-10-CM

## 2017-03-29 ENCOUNTER — Encounter: Payer: BLUE CROSS/BLUE SHIELD | Admitting: Internal Medicine

## 2017-04-15 ENCOUNTER — Encounter: Payer: Self-pay | Admitting: Obstetrics & Gynecology

## 2017-04-22 ENCOUNTER — Ambulatory Visit: Payer: Self-pay | Admitting: *Deleted

## 2017-04-22 ENCOUNTER — Encounter (HOSPITAL_COMMUNITY): Payer: Self-pay

## 2017-04-22 ENCOUNTER — Emergency Department (HOSPITAL_COMMUNITY)
Admission: EM | Admit: 2017-04-22 | Discharge: 2017-04-22 | Discharge status: Against Medical Advice | Payer: BLUE CROSS/BLUE SHIELD | Attending: Emergency Medicine | Admitting: Emergency Medicine

## 2017-04-22 DIAGNOSIS — Z5321 Procedure and treatment not carried out due to patient leaving prior to being seen by health care provider: Secondary | ICD-10-CM | POA: Diagnosis not present

## 2017-04-22 DIAGNOSIS — R42 Dizziness and giddiness: Secondary | ICD-10-CM | POA: Insufficient documentation

## 2017-04-22 LAB — BASIC METABOLIC PANEL
Anion gap: 8 (ref 5–15)
BUN: 13 mg/dL (ref 6–20)
CALCIUM: 8.5 mg/dL — AB (ref 8.9–10.3)
CO2: 23 mmol/L (ref 22–32)
CREATININE: 0.83 mg/dL (ref 0.44–1.00)
Chloride: 106 mmol/L (ref 101–111)
GFR calc Af Amer: >60 mL/min (ref >60)
Glucose, Bld: 86 mg/dL (ref 65–99)
Potassium: 3.6 mmol/L (ref 3.5–5.1)
Sodium: 137 mmol/L (ref 135–145)

## 2017-04-22 LAB — CBC
HCT: 32.8 % — ABNORMAL LOW (ref 36.0–46.0)
Hemoglobin: 10.7 g/dL — ABNORMAL LOW (ref 12.0–15.0)
MCH: 28.5 pg (ref 26.0–34.0)
MCHC: 32.6 g/dL (ref 30.0–36.0)
MCV: 87.2 fL (ref 78.0–100.0)
PLATELETS: 266 10*3/uL (ref 150–400)
RBC: 3.76 MIL/uL — ABNORMAL LOW (ref 3.87–5.11)
RDW: 14.9 % (ref 11.5–15.5)
WBC: 2.4 10*3/uL — AB (ref 4.0–10.5)

## 2017-04-22 LAB — URINALYSIS, ROUTINE W REFLEX MICROSCOPIC
Bacteria, UA: NONE SEEN
Bilirubin Urine: NEGATIVE
Glucose, UA: NEGATIVE mg/dL
Hgb urine dipstick: NEGATIVE
Ketones, ur: NEGATIVE mg/dL
LEUKOCYTES UA: NEGATIVE
NITRITE: NEGATIVE
PH: 5 (ref 5.0–8.0)
Protein, ur: 100 mg/dL — AB
SPECIFIC GRAVITY, URINE: 1.033 — AB (ref 1.005–1.030)

## 2017-04-22 LAB — I-STAT BETA HCG BLOOD, ED (MC, WL, AP ONLY): I-stat hCG, quantitative: <5 m[IU]/mL (ref <5)

## 2017-04-22 NOTE — Telephone Encounter (Signed)
Patient was in the ER at the time of this call back.

## 2017-04-22 NOTE — ED Notes (Signed)
04/22/2017, 15:11, Pt. Came up to the [facility] and reported that she has to leave.  Encouraged the pt. To stay, she stated, "I have to leave and I will maybe come back tonight. "    Apologized for the long wait. Pt. Was very understanding.

## 2017-04-22 NOTE — ED Triage Notes (Signed)
PT reports dizziness and weakness that began yesterday when returning home from work. PT endorses nausea and diarrhea yesterday.

## 2017-04-27 ENCOUNTER — Encounter: Payer: Self-pay | Admitting: Obstetrics & Gynecology

## 2017-04-27 DIAGNOSIS — Z0289 Encounter for other administrative examinations: Secondary | ICD-10-CM

## 2017-04-28 ENCOUNTER — Telehealth: Payer: Self-pay | Admitting: Internal Medicine

## 2017-04-28 NOTE — Telephone Encounter (Signed)
Copied from CRM [phone]. Topic: Quick Communication - Lab Results >> Apr 28, 2017  1:35 PM Lacey ByarsGreen, Temeka L, RMA wrote: Patient is requesting lab results please return pt call

## 2017-04-29 ENCOUNTER — Encounter: Payer: Self-pay | Admitting: Family Medicine

## 2017-04-29 ENCOUNTER — Ambulatory Visit: Payer: BLUE CROSS/BLUE SHIELD | Admitting: Family Medicine

## 2017-04-29 VITALS — BP 124/72 | HR 72 | Temp 97.5°F | Wt 194.2 lb

## 2017-04-29 DIAGNOSIS — H02823 Cysts of right eye, unspecified eyelid: Secondary | ICD-10-CM

## 2017-04-29 DIAGNOSIS — H538 Other visual disturbances: Secondary | ICD-10-CM

## 2017-04-29 DIAGNOSIS — R5383 Other fatigue: Secondary | ICD-10-CM

## 2017-04-29 DIAGNOSIS — E038 Other specified hypothyroidism: Secondary | ICD-10-CM

## 2017-04-29 LAB — TSH: TSH: 75.92 u[IU]/mL — ABNORMAL HIGH (ref 0.35–4.50)

## 2017-04-29 LAB — FERRITIN: Ferritin: 4.5 ng/mL — ABNORMAL LOW (ref 10.0–291.0)

## 2017-04-29 LAB — T4, FREE: Free T4: 0.26 ng/dL — ABNORMAL LOW (ref 0.60–1.60)

## 2017-04-29 NOTE — Patient Instructions (Addendum)
You have an epidermal inclusion cyst. We are putting in a referral to Ophthalmology for your cyst and blurred vision. We also will do a referral to Endocrinology. Please stop and see the referrals coordinator prior to leaving  We also want to draw some blood, please stop by the lab before leaving.  Please schedule a complete physical with [name] for 1 month.   It has been a pleasure seeing you today. Lacey ApleyJoshua M Jackelyn Illingworth, RN, Adult-Geriatric Nurse Practitioner Student and [name], FNP   Epidermal Cyst An epidermal cyst is a small, painless lump under your skin. It may be called an epidermal inclusion cyst or an infundibular cyst. The cyst contains a grayish-white, bad-smelling substance (keratin). It is important not to pop epidermal cysts yourself. These cysts are usually harmless (benign), but they can get infected. Symptoms of infection may include:  Redness.  Inflammation.  Tenderness.  Warmth.  Fever.  A grayish-white, bad-smelling substance draining from the cyst.  Pus draining from the cyst.  Follow these instructions at home:  Take over-the-counter and prescription medicines only as told by your doctor.  If you were prescribed an antibiotic, use it as told by your doctor. Do not stop using the antibiotic even if you start to feel better.  Keep the area around your cyst clean and dry.  Wear loose, dry clothing.  Do not try to pop your cyst.  Avoid touching your cyst.  Check your cyst every day for signs of infection.  Keep all follow-up visits as told by your doctor. This is important. How is this prevented?  Wear clean, dry, clothing.  Avoid wearing tight clothing.  Keep your skin clean and dry. Shower or take baths every day.  Wash your body with a benzoyl peroxide wash when you shower or bathe. Contact a health care provider if:  Your cyst has symptoms of infection.  Your condition is not improving or is getting worse.  You have a cyst  that looks different from other cysts you have had.  You have a fever. Get help right away if:  Redness spreads from the cyst into the surrounding area. This information is not intended to replace advice given to you by your health care provider. Make sure you discuss any questions you have with your health care provider. Document Released: 03/04/2004 Document Revised: 09/24/2015 Document Reviewed: 11/27/2014 Elsevier Interactive Patient Education  [employer]2018 Elsevier Inc.

## 2017-04-29 NOTE — Progress Notes (Signed)
Subjective:    Patient ID: Lacey Mccarty, female    DOB: January 16, 1976, 42 y.o.   MRN: [id number]  HPI This is a 42 yo female who presents today for bump in right eyelid for several months.   Has been off synthroid for "a long time." Did not have health insurance and did not come in for follow up. Last TSH 6.70 03/02/16 and synthroid increased to 200 mcg at that time. She occasionally takes one of her friend's thyroid medication pills. She had been referred to endocrine in past but was unable to go due to loosing insurance.   Has occasional feeling of being off balance, dizziness, feels tired. Periods are regular, heavy for 3 days. Went to ER 04/22/17, didn't stay to be seen. CBC showed hct 32 (33 one year prior) and decreased WBC.   Past Medical History:  Diagnosis Date  . Asthma   . Chronic back pain   . Graves disease    I-131 ablation late 07/2012  . Mediastinal mass   . 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 CESAREAN SECTION    . LIPOSUCTION    . NM THYROID STIM SUPRESS     Family History  Problem Relation Age of Onset  . HIV Mother   . Diabetes Mellitus II Mother   . Hypertension Father   . Allergies Child   . Allergies Child   . Colon cancer Paternal Grandmother   . Stomach cancer Paternal Grandmother   . Breast cancer Maternal Aunt   . Asthma Maternal Aunt    Social History   Tobacco Use  . Smoking status: Former Smoker    Packs/day: 0.25    Years: 7.00    Pack years: 1.75    Last attempt to quit: 09/14/2013    Years since quitting: 3.6  . Smokeless tobacco: Never Used  Substance Use Topics  . Alcohol use: Yes    Comment: occasional  . Drug use: No      Review of Systems Per HPI    Objective:   Physical Exam  Constitutional: She is oriented to person, place, and time. She appears well-developed and well-nourished. No distress.  HENT:  Head: Normocephalic and atraumatic.  Eyes: Conjunctivae and EOM are  normal.    Cardiovascular: Normal rate, regular rhythm and normal heart sounds.  Pulmonary/Chest: Effort normal and breath sounds normal.  Musculoskeletal: She exhibits no edema.  Neurological: She is alert and oriented to person, place, and time.  Skin: Skin is warm and dry. She is not diaphoretic.  Psychiatric: She has a normal mood and affect. Her behavior is normal. Judgment and thought content normal.  Vitals reviewed.    BP 124/72   Pulse 72   Temp (!) 97.5 F (36.4 C) (Oral)   Wt 194 lb 4 oz (88.1 kg)   LMP 04/22/2017   SpO2 99%   BMI 34.41 kg/m  Wt Readings from Last 3 Encounters:  04/29/17 194 lb 4 oz (88.1 kg)  03/19/16 192 lb (87.1 kg)  03/12/16 189 lb (85.7 kg)        Assessment & Plan:  1. Eyelid cyst, right - Ambulatory referral to Ophthalmology  2. Other specified hypothyroidism - TSH - T4, Free - Ambulatory referral to Endocrinology  3. Fatigue, unspecified type - Ferritin - Ambulatory referral to Endocrinology  4. Blurred vision, bilateral - Ambulatory referral to Ophthalmology  - follow up with PCP in 1 month  Gavin Pound  Leone PayorGessner, FNP-BC  [facility]  04/30/2017 8:47 AM

## 2017-04-29 NOTE — Progress Notes (Signed)
Subjective:    Patient ID: Lacey Mccarty, female    DOB: 22-Mar-1975, 42 y.o.   MRN: [id number]  HPI Lacey Mccarty is a 42 y.o. female who presents today with a CC of a Right eye cyst. She reports the cyst being present for months and was bigger than it is today. Denies pain, drainage, recent infection or swelling.   Hypothyroidism- Has been off medication for over a year and feeling dizzy/lightheaded, fatigued, and some blurred vision. Tried to get seen by Endocrinology back in Sept 2018 but was unable to due to insurance issues.   Review of Systems  Constitutional: Negative.   Eyes: Positive for visual disturbance. Negative for redness and itching.  Respiratory: Negative for shortness of breath.   Cardiovascular: Negative for chest pain.       Past Medical History:  Diagnosis Date  . Asthma   . Chronic back pain   . Graves disease    I-131 ablation late 07/2012  . Mediastinal mass   . 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 CESAREAN SECTION    . LIPOSUCTION    . NM THYROID STIM SUPRESS     Family History  Problem Relation Age of Onset  . HIV Mother   . Diabetes Mellitus II Mother   . Hypertension Father   . Allergies Child   . Allergies Child   . Colon cancer Paternal Grandmother   . Stomach cancer Paternal Grandmother   . Breast cancer Maternal Aunt   . Asthma Maternal Aunt    Social History   Socioeconomic History  . Marital status: Single    Spouse name: Not on file  . Number of children: Not on file  . Years of education: Not on file  . Highest education level: Not on file  Occupational History  . Not on file  Social Needs  . Financial resource strain: Not on file  . Food insecurity:    Worry: Not on file    Inability: Not on file  . Transportation needs:    Medical: Not on file    Non-medical: Not on file  Tobacco Use  . Smoking status: Former Smoker    Packs/day: 0.25    Years: 7.00    Pack  years: 1.75    Last attempt to quit: 09/14/2013    Years since quitting: 3.6  . Smokeless tobacco: Never Used  Substance and Sexual Activity  . Alcohol use: Yes    Comment: occasional  . Drug use: No  . Sexual activity: Yes  Lifestyle  . Physical activity:    Days per week: Not on file    Minutes per session: Not on file  . Stress: Not on file  Relationships  . Social connections:    Talks on phone: Not on file    Gets together: Not on file    Attends religious service: Not on file    Active member of club or organization: Not on file    Attends meetings of clubs or organizations: Not on file    Relationship status: Not on file  . Intimate partner violence:    Fear of current or ex partner: Not on file    Emotionally abused: Not on file    Physically abused: Not on file    Forced sexual activity: Not on file  Other Topics Concern  . Not on file  Social History Narrative   ** Merged History Encounter **  Regular exercise: no   Caffeine use: none       Current Outpatient Medications on File Prior to Visit  Medication Sig Dispense Refill  . indomethacin (INDOCIN SR) 75 MG CR capsule Take 1 capsule (75 mg total) by mouth 2 (two) times daily with a meal. 30 capsule 0  . montelukast (SINGULAIR) 10 MG tablet Take 1 tablet (10 mg total) by mouth at bedtime. 30 tablet 2  . naproxen (NAPROSYN) 500 MG tablet Take 1 tablet (500 mg total) by mouth 2 (two) times daily. 30 tablet 0  . levothyroxine (SYNTHROID, LEVOTHROID) 200 MCG tablet Take 1 tablet (200 mcg total) by mouth daily before breakfast. (Patient not taking: Reported on 04/29/2017) 30 tablet 2   No current facility-administered medications on file prior to visit.     Objective:   Physical Exam  Eyes: Pupils are equal, round, and reactive to light. Conjunctivae, EOM and lids are normal.      BP 124/72   Pulse 72   Temp (!) 97.5 F (36.4 C) (Oral)   Wt 194 lb 4 oz (88.1 kg)   LMP 04/22/2017   SpO2 99%   BMI  34.41 kg/m      Assessment & Plan:  1. Eyelid cyst, right - Epidermal Inclusion Cyst - Ambulatory referral to Ophthalmology for treatment  2. Other specified hypothyroidism - TSH - T4, Free - Ambulatory referral to Endocrinology  3. Fatigue, unspecified type - Ferritin - Ambulatory referral to Endocrinology  4. Blurred vision, bilateral - Ambulatory referral to Ophthalmology  Rebecka Apley, RN, Adult-Geriatric Nurse Practitioner Student

## 2017-05-02 ENCOUNTER — Other Ambulatory Visit: Payer: Self-pay | Admitting: Internal Medicine

## 2017-05-02 NOTE — Telephone Encounter (Signed)
Copied from CRM [phone]. Topic: Quick Communication - Rx Refill/Question >> May 02, 2017 12:51 PM Stephannie LiSimmons, Diera Wirkkala L, NT wrote: Medication:  levothyroxine (SYNTHROID, LEVOTHROID) 200 MCG tablet  Has the patient contacted their pharmacy? {yes (Agent: If no, request that the patient contact the pharmacy for the refill.) Preferred Pharmacy (with phone number or street name): [facility] [id number] - [place], [place] - [address] [facility] [address] [facility] [phone] (Phone) [phone] (Fax Agent: Please be advised that RX refills may take up to 3 business days. We ask that you follow-up with your pharmacy.

## 2017-05-03 ENCOUNTER — Encounter: Payer: Self-pay | Admitting: Internal Medicine

## 2017-05-03 MED ORDER — LEVOTHYROXINE SODIUM 200 MCG PO TABS: 200.0000 ug | ORAL_TABLET | Freq: Every day | ORAL | 2 refills | Status: DC

## 2017-05-03 NOTE — Telephone Encounter (Signed)
Pt called to check status of Rx, contact pt to advise

## 2017-05-03 NOTE — Telephone Encounter (Signed)
Levothyroxine refill Last OV: 04/29/17 Last Refill:03/02/16 #30 tab 2 RF Pharmacy: [facility] [address] PCP: [name] NP Last TSH 04/29/17  TSH: 75.92

## 2017-05-03 NOTE — Telephone Encounter (Signed)
Pt requesting refill levothyroxine; R Baity NP last saw pt 03/19/16 for annual. CPX scheduled 06/08/17. Pt saw Lacey Mccarty on 04/29/17 and endo referral done. In 04/29/17 lab result note Lacey Mccarty put sent in levothyroxine to pharmacy; not on med list. 04/29/17 TSH was 75.92 and Free T4 was 0.26. Levothyroxine last refilled # 30 x 2 03/02/16. Please see office note 04/29/17.Please advise.

## 2017-05-03 NOTE — Telephone Encounter (Signed)
Debbie,  Do you recall sending in Levothyroxine for patient? This is not at her pharmacy.

## 2017-05-12 ENCOUNTER — Encounter: Payer: Self-pay | Admitting: Internal Medicine

## 2017-05-12 LAB — HM DIABETES EYE EXAM

## 2017-06-08 ENCOUNTER — Encounter: Payer: BLUE CROSS/BLUE SHIELD | Admitting: Internal Medicine

## 2017-06-08 DIAGNOSIS — Z0289 Encounter for other administrative examinations: Secondary | ICD-10-CM

## 2017-06-08 NOTE — Progress Notes (Deleted)
   Subjective:    Patient ID: Lacey Mccarty, female    DOB: 30-Jul-1975, 42 y.o.   MRN: [id number]  HPI  Pt presents to the clinic today for her annual exam.  Flu: Tetanus: 10/2013 Pap smear: 03/2015 Mammogram: never Vision Screening: Dentist:  Diet: Exercise:  Review of Systems    .[email or web address]  Objective:   Physical Exam        Assessment & Plan:

## 2018-05-10 DIAGNOSIS — D649 Anemia, unspecified: Secondary | ICD-10-CM

## 2018-05-10 HISTORY — DX: Anemia, unspecified: D64.9

## 2019-01-28 ENCOUNTER — Encounter (HOSPITAL_COMMUNITY): Payer: Self-pay

## 2019-01-28 ENCOUNTER — Other Ambulatory Visit: Payer: Self-pay

## 2019-01-28 ENCOUNTER — Ambulatory Visit (HOSPITAL_COMMUNITY)
Admission: EM | Admit: 2019-01-28 | Discharge: 2019-01-28 | Disposition: A | Discharge status: Home / Self Care | Payer: BLUE CROSS/BLUE SHIELD | Attending: Family Medicine | Admitting: Family Medicine

## 2019-01-28 DIAGNOSIS — Z20822 Contact with and (suspected) exposure to covid-19: Secondary | ICD-10-CM

## 2019-01-28 DIAGNOSIS — J4520 Mild intermittent asthma, uncomplicated: Secondary | ICD-10-CM | POA: Insufficient documentation

## 2019-01-28 DIAGNOSIS — Z20828 Contact with and (suspected) exposure to other viral communicable diseases: Secondary | ICD-10-CM | POA: Insufficient documentation

## 2019-01-28 DIAGNOSIS — B9789 Other viral agents as the cause of diseases classified elsewhere: Secondary | ICD-10-CM | POA: Insufficient documentation

## 2019-01-28 DIAGNOSIS — Z760 Encounter for issue of repeat prescription: Secondary | ICD-10-CM

## 2019-01-28 DIAGNOSIS — J988 Other specified respiratory disorders: Secondary | ICD-10-CM | POA: Insufficient documentation

## 2019-01-28 LAB — POCT RAPID STREP A: Streptococcus, Group A Screen (Direct): NEGATIVE

## 2019-01-28 MED ORDER — MONTELUKAST SODIUM 10 MG PO TABS: 10.0000 mg | ORAL_TABLET | Freq: Every day | ORAL | 2 refills | Status: DC

## 2019-01-28 NOTE — ED Provider Notes (Signed)
[facility]    CSN: [id number] Arrival date & time: 01/28/19  1137      History   Chief Complaint Chief Complaint  Patient presents with  . Sore Throat  . Headache    HPI Lacey Mccarty is a 43 y.o. female.   HPI Lacey SinghDawn S Mccarty presents with symptoms of headache and mild aching throat x 2 days. No known sick contacts. Headache has improved with antiinflammatory. No other URI symptoms present. Patient prescribed Singulair and has been without for sometime. Afebrile. No CP or SOB. Of note patient reports she has underlying hypothyroidism and has been unable to see her PCP for TSH level due to insurance issues and therefore has also been without Synthroid. Patient has a history of Graves disease and underwent an ablation which resulted in hypothyroidism. Pt requests a refill Synthroid and Singulair.  Past Medical History:  Diagnosis Date  . Asthma   . Chronic back pain   . Graves disease    I-131 ablation late 07/2012  . Mediastinal mass   . 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 Hypothyroidism following radioiodine therapy 10/11/2012  . Allergy-induced asthma 08/30/2012  . Tobacco abuse 03/21/2012    Past Surgical History:  Procedure Laterality Date  . CESAREAN SECTION    . LIPOSUCTION    . NM THYROID STIM SUPRESS      OB History   No obstetric history on file.      Home Medications    Prior to Admission medications   Medication Sig Start Date End Date Taking? Authorizing Provider  indomethacin (INDOCIN SR) 75 MG CR capsule Take 1 capsule (75 mg total) by mouth 2 (two) times daily with a meal. 03/12/16   Nelwyn SalisburyFry, Stephen A, MD  levothyroxine (SYNTHROID, LEVOTHROID) 200 MCG tablet Take 1 tablet (200 mcg total) by mouth daily before breakfast. 05/03/17   Lorre MunroeBaity, Regina W, NP  montelukast (SINGULAIR) 10 MG tablet Take 1 tablet (10 mg total) by mouth at bedtime. 02/18/15   Lorre MunroeBaity, Regina W, NP  naproxen  (NAPROSYN) 500 MG tablet Take 1 tablet (500 mg total) by mouth 2 (two) times daily. 03/10/16   Barrett HenleNadeau, Nicole Elizabeth, PA-C    Family History Family History  Problem Relation Age of Onset  . HIV Mother   . Diabetes Mellitus II Mother   . Hypertension Father   . Cancer Father        Lung  . Allergies Child   . Allergies Child   . Colon cancer Paternal Grandmother   . Stomach cancer Paternal Grandmother   . Breast cancer Maternal Aunt   . Asthma Maternal Aunt     Social History Social History   Tobacco Use  . Smoking status: Former Smoker    Packs/day: 0.25    Years: 7.00    Pack years: 1.75    Quit date: 09/14/2013    Years since quitting: 5.3  . Smokeless tobacco: Never Used  Substance Use Topics  . Alcohol use: Yes    Comment: occasional  . Drug use: No     Allergies   Tapazole [methimazole]   Review of Systems Review of Systems Pertinent negatives listed in HPI  Physical Exam Triage Vital Signs ED Triage Vitals  Enc Vitals Group     BP 01/28/19 1247 (!) 139/96     Pulse Rate 01/28/19 1247 88     Resp 01/28/19 1247 16  Temp 01/28/19 1247 98.2 F (36.8 C)     Temp Source 01/28/19 1247 Oral     SpO2 01/28/19 1247 100 %     Weight --      Height --      Head Circumference --      Peak Flow --      Pain Score 01/28/19 1245 6     Pain Loc --      Pain Edu? --      Excl. in [place]? --    No data found.  Updated Vital Signs BP (!) 139/96 (BP Location: Right Arm)   Pulse 88   Temp 98.2 F (36.8 C) (Oral)   Resp 16   SpO2 100%   Visual Acuity Right Eye Distance:   Left Eye Distance:   Bilateral Distance:    Right Eye Near:   Left Eye Near:    Bilateral Near:     Physical Exam HENT:     Head: Normocephalic.     Right Ear: No tenderness.     Left Ear: No tenderness.     Nose: No rhinorrhea.     Mouth/Throat:     Pharynx: No posterior oropharyngeal erythema or uvula swelling.     Tonsils: No tonsillar exudate or tonsillar abscesses. 0 on  the right. 0 on the left.  Cardiovascular:     Rate and Rhythm: Normal rate and regular rhythm.  Pulmonary:     Effort: Pulmonary effort is normal.     Breath sounds: Normal breath sounds.  Lymphadenopathy:     Cervical: No cervical adenopathy.  Skin:    General: Skin is warm.  Neurological:     Mental Status: She is alert.      UC Treatments / Results  Labs (all labs ordered are listed, but only abnormal results are displayed) Labs Reviewed  NOVEL CORONAVIRUS, NAA (HOSP ORDER, SEND-OUT TO REF LAB; TAT 18-24 HRS)  CULTURE, GROUP A STREP [facility])  POCT RAPID STREP A    EKG   Radiology No results found.  Procedures Procedures (including critical care time)  Medications Ordered in UC Medications - No data to display  Initial Impression / Assessment and Plan / UC Course  I have reviewed the triage vital signs and the nursing notes.  Pertinent labs & imaging results that were available during my care of the patient were reviewed by me and considered in my medical decision making (see chart for details).   Viral illness without any acute complications. Rapid strep negative. COVID-19 testing pending. Symptomatic treatment recommended with continuation of over-the-counter medicines.  Work-up pending.  Warm salt water gargles recommended for management of soreness of throat.  Singulair refilled.  Advised patient to refill levothyroxine she has not had any recent TSH test.  Information provided to establish with [facility] [place] or [place], as practices will accept patients without health insurance.  Red flags discussed. MyChart password reset at the request of patient. Pt verbalized understanding and agreement with plan. Final Clinical Impressions(s) / UC Diagnoses   Final diagnoses:  Viral respiratory illness  Encounter for laboratory testing for COVID-19 virus     Discharge Instructions     I was able to change your password to my Chart    Pass1234*. Please login and change your password.    ED Prescriptions    Medication Sig Dispense Auth. Provider   montelukast (SINGULAIR) 10 MG tablet Take 1 tablet (10 mg total) by mouth at bedtime.  30 tablet Bing Neighbors, FNP     PDMP not reviewed this encounter.   Bing Neighbors, FNP 01/30/19 [phone]

## 2019-01-28 NOTE — ED Triage Notes (Signed)
Patient presents to Urgent Care with complaints of headache and sore thorat since yesterday. Patient reports she took motrin and aspirin and it helped the headache.

## 2019-01-28 NOTE — Discharge Instructions (Addendum)
I was able to change your password to my Chart  Pass1234*. Please login and change your password.

## 2019-01-29 LAB — NOVEL CORONAVIRUS, NAA (HOSP ORDER, SEND-OUT TO REF LAB; TAT 18-24 HRS): SARS-CoV-2, NAA: NOT DETECTED

## 2019-01-30 LAB — CULTURE, GROUP A STREP (THRC)

## 2019-02-26 ENCOUNTER — Encounter: Payer: Self-pay | Admitting: Family Medicine

## 2019-02-26 ENCOUNTER — Ambulatory Visit (INDEPENDENT_AMBULATORY_CARE_PROVIDER_SITE_OTHER): Payer: Self-pay | Admitting: Family Medicine

## 2019-02-26 VITALS — BP 130/78 | HR 80 | Temp 97.8°F | Ht 63.0 in | Wt 196.2 lb

## 2019-02-26 DIAGNOSIS — Z7689 Persons encountering health services in other specified circumstances: Secondary | ICD-10-CM

## 2019-02-26 DIAGNOSIS — Z09 Encounter for follow-up examination after completed treatment for conditions other than malignant neoplasm: Secondary | ICD-10-CM

## 2019-02-26 DIAGNOSIS — Z0000 Encounter for general adult medical examination without abnormal findings: Secondary | ICD-10-CM

## 2019-02-26 DIAGNOSIS — G8929 Other chronic pain: Secondary | ICD-10-CM

## 2019-02-26 DIAGNOSIS — Z1231 Encounter for screening mammogram for malignant neoplasm of breast: Secondary | ICD-10-CM

## 2019-02-26 DIAGNOSIS — M79642 Pain in left hand: Secondary | ICD-10-CM

## 2019-02-26 LAB — POCT URINALYSIS DIPSTICK
Blood, UA: NEGATIVE
Glucose, UA: NEGATIVE
Ketones, UA: 15
Leukocytes, UA: NEGATIVE
Nitrite, UA: NEGATIVE
Protein, UA: POSITIVE — AB
Spec Grav, UA: >=1.03 — AB (ref 1.010–1.025)
Urobilinogen, UA: 0.2 E.U./dL
pH, UA: 6 (ref 5.0–8.0)

## 2019-02-26 NOTE — Progress Notes (Signed)
[facility]   New Patient--Hospital Follow Up--Establish Care  Subjective:  Patient ID: Lacey Mccarty, female    DOB: 09-Jun-1975  Age: 44 y.o. MRN: [id number]  CC:  Chief Complaint  Patient presents with  . Hospitalization Follow-up    ED 01/28/2019 Allergy Induced Asthma  . Establish Care    HPI Lacey Mccarty is a 44 year old female who presents for Hospital Follow Up and to Establish Care today.  Past Medical History:  Diagnosis Date  . Asthma   . Chronic back pain   . Graves disease    I-131 ablation late 07/2012  . Hypothyroidism   . Mediastinal mass   . 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's initial office visit with me. She was previously seeing Nicki Reaper, NP for her PCP needs. Since her last office visit, she has had an ED visit for Viral Respiratory Illness on 01/28/2019. Today, she is doing well with no complaints. She has been living in [place], relocating from [place] 9 years ago. Left hand pain X 1 year now. She does not report any health problems by Hyperthyroidism.  She denies fevers, chills, fatigue, recent infections, weight loss, and night sweats. She has not had any headaches, visual changes, dizziness, and falls. No chest pain, heart palpitations, cough and shortness of breath reported. No reports of GI problems such as nausea, vomiting, diarrhea, and constipation. She has no reports of blood in stools, dysuria and hematuria. No depression or anxiety reported today. She denies suicidal ideations, homicidal ideations, or auditory hallucinations.   Past Surgical History:  Procedure Laterality Date  . CESAREAN SECTION    . LIPOSUCTION    . NM THYROID STIM SUPRESS      Family History  Problem Relation Age of Onset  . HIV Mother   . Diabetes Mellitus II Mother   . Hypertension Father   . Cancer Father        Lung  . Allergies Child   . Allergies Child   . Colon cancer  Paternal Grandmother   . Stomach cancer Paternal Grandmother   . Breast cancer Maternal Aunt   . Asthma Maternal Aunt     Social History   Socioeconomic History  . Marital status: Single    Spouse name: Not on file  . Number of children: Not on file  . Years of education: Not on file  . Highest education level: Not on file  Occupational History  . Not on file  Tobacco Use  . Smoking status: Former Smoker    Packs/day: 0.25    Years: 7.00    Pack years: 1.75    Quit date: 09/14/2013    Years since quitting: 5.4  . Smokeless tobacco: Never Used  Substance and Sexual Activity  . Alcohol use: Yes    Comment: occasional  . Drug use: No  . Sexual activity: Yes  Other Topics Concern  . Not on file  Social History Narrative   ** Merged History Encounter **      Regular exercise: no   Caffeine use: none       Social Determinants of Corporate investment banker Strain:   . Difficulty of Paying Living Expenses: Not on file  Food Insecurity:   . Worried About Programme researcher, broadcasting/film/video in the Last Year: Not on file  . Ran Out of Food in the Last Year: Not on  file  Transportation Needs:   . Freight forwarder (Medical): Not on file  . Lack of Transportation (Non-Medical): Not on file  Physical Activity:   . Days of Exercise per Week: Not on file  . Minutes of Exercise per Session: Not on file  Stress:   . Feeling of Stress : Not on file  Social Connections:   . Frequency of Communication with Friends and Family: Not on file  . Frequency of Social Gatherings with Friends and Family: Not on file  . Attends Religious Services: Not on file  . Active Member of Clubs or Organizations: Not on file  . Attends Banker Meetings: Not on file  . Marital Status: Not on file  Intimate Partner Violence:   . Fear of Current or Ex-Partner: Not on file  . Emotionally Abused: Not on file  . Physically Abused: Not on file  . Sexually Abused: Not on file    Outpatient  Medications Prior to Visit  Medication Sig Dispense Refill  . levothyroxine (SYNTHROID, LEVOTHROID) 200 MCG tablet Take 1 tablet (200 mcg total) by mouth daily before breakfast. 30 tablet 2  . montelukast (SINGULAIR) 10 MG tablet Take 1 tablet (10 mg total) by mouth at bedtime. 30 tablet 2  . indomethacin (INDOCIN SR) 75 MG CR capsule Take 1 capsule (75 mg total) by mouth 2 (two) times daily with a meal. 30 capsule 0  . naproxen (NAPROSYN) 500 MG tablet Take 1 tablet (500 mg total) by mouth 2 (two) times daily. (Patient not taking: Reported on 02/26/2019) 30 tablet 0   No facility-administered medications prior to visit.    Allergies  Allergen Reactions  . Tapazole [Methimazole] Hives and Shortness Of Breath    ROS Review of Systems  Constitutional: Negative.   HENT: Negative.   Eyes: Negative.   Respiratory: Negative.   Cardiovascular: Negative.   Gastrointestinal: Positive for abdominal distention.  Endocrine: Negative.   Genitourinary: Negative.   Musculoskeletal: Positive for arthralgias (chronic left hand/arm pain).  Skin: Negative.   Allergic/Immunologic: Negative.   Neurological: Negative.   Hematological: Negative.   Psychiatric/Behavioral: Negative.       Objective:    Physical Exam  Constitutional: She is oriented to person, place, and time. She appears well-developed and well-nourished.  HENT:  Head: Normocephalic and atraumatic.  Eyes: Conjunctivae are normal.  Cardiovascular: Normal rate, regular rhythm, normal heart sounds and intact distal pulses.  Pulmonary/Chest: Effort normal and breath sounds normal.  Abdominal: Soft. Bowel sounds are normal. She exhibits distension (obese).  Musculoskeletal:        General: Normal range of motion.     Cervical back: Normal range of motion and neck supple.     Comments: Chronic left hand pain  Neurological: She is alert and oriented to person, place, and time. She has normal reflexes.  Skin: Skin is warm and dry.    Psychiatric: She has a normal mood and affect. Her behavior is normal. Judgment and thought content normal.  Nursing note and vitals reviewed.   BP 130/78   Pulse 80   Temp 97.8 F (36.6 C) (Oral)   Ht 5\' 3"  (1.6 m)   Wt 196 lb 3.2 oz (89 kg)   LMP 02/23/2019   SpO2 98%   BMI 34.76 kg/m  Wt Readings from Last 3 Encounters:  02/26/19 196 lb 3.2 oz (89 kg)  04/29/17 194 lb 4 oz (88.1 kg)  03/19/16 192 lb (87.1 kg)     Health Maintenance  Due  Topic Date Due  . INFLUENZA VACCINE  09/09/2018    There are no preventive care reminders to display for this patient.  Lab Results  Component Value Date   TSH 75.92 (H) 04/29/2017   Lab Results  Component Value Date   WBC 2.4 (L) 04/22/2017   HGB 10.7 (L) 04/22/2017   HCT 32.8 (L) 04/22/2017   MCV 87.2 04/22/2017   PLT 266 04/22/2017   Lab Results  Component Value Date   NA 137 04/22/2017   K 3.6 04/22/2017   CO2 23 04/22/2017   GLUCOSE 86 04/22/2017   BUN 13 04/22/2017   CREATININE 0.83 04/22/2017   BILITOT 0.5 03/19/2016   ALKPHOS 56 03/19/2016   AST 12 03/19/2016   ALT 9 03/19/2016   PROT 7.1 03/19/2016   ALBUMIN 4.0 03/19/2016   CALCIUM 8.5 (L) 04/22/2017   ANIONGAP 8 04/22/2017   GFR 119.38 03/19/2016   Lab Results  Component Value Date   CHOL 170 03/19/2016   Lab Results  Component Value Date   HDL 52.80 03/19/2016   Lab Results  Component Value Date   LDLCALC 104 (H) 03/19/2016   Lab Results  Component Value Date   TRIG 70.0 03/19/2016   Lab Results  Component Value Date   CHOLHDL 3 03/19/2016   Lab Results  Component Value Date   HGBA1C 5.4 08/30/2012      Assessment & Plan:   1. Encounter to establish care  2. Chronic hand pain, left - DG Forearm Left; Future  3. Encounter for screening mammogram for malignant neoplasm of breast - MM Digital Diagnostic Bilat; Future  4. Healthcare maintenance - POCT urinalysis dipstick - TSH - CBC with Differential - Comprehensive  metabolic panel - Lipid Panel - Vitamin B12 - Vitamin D, 25-hydroxy - T3, Free - T4, Free - Iodine, Serum/Plasma  5. Follow up She will follow up in 3 months.   No orders of the defined types were placed in this encounter.   Orders Placed This Encounter  Procedures  . DG Forearm Left  . MM Digital Diagnostic Bilat  . TSH  . CBC with Differential  . Comprehensive metabolic panel  . Lipid Panel  . Vitamin B12  . Vitamin D, 25-hydroxy  . T3, Free  . T4, Free  . Iodine, Serum/Plasma  . POCT urinalysis dipstick    Referral Orders  No referral(s) requested today    Raliegh Ip,  MSN, FNP-BC [facility] [address] [place], [place] [address] [phone] fax    Problem List Items Addressed This Visit    None    Visit Diagnoses    Encounter to establish care    -  Primary   Chronic hand pain, left       Relevant Orders   DG Forearm Left   Encounter for screening mammogram for malignant neoplasm of breast       Relevant Orders   MM Digital Diagnostic Bilat   Healthcare maintenance       Relevant Orders   POCT urinalysis dipstick (Completed)   TSH   CBC with Differential   Comprehensive metabolic panel   Lipid Panel   Vitamin B12   Vitamin D, 25-hydroxy   T3, Free   T4, Free   Iodine, Serum/Plasma   Follow up          No orders of the defined types were placed in this encounter.  Follow-up: Return in about 3 months (around 05/27/2019).    Azzie Glatter, FNP

## 2019-03-01 ENCOUNTER — Telehealth: Payer: Self-pay

## 2019-03-01 ENCOUNTER — Other Ambulatory Visit: Payer: Self-pay

## 2019-03-01 ENCOUNTER — Other Ambulatory Visit: Payer: Self-pay | Admitting: Family Medicine

## 2019-03-01 ENCOUNTER — Ambulatory Visit (HOSPITAL_COMMUNITY)
Admission: RE | Admit: 2019-03-01 | Discharge: 2019-03-01 | Disposition: A | Discharge status: Home / Self Care | Payer: Medicaid Other | Source: Ambulatory Visit | Attending: Family Medicine | Admitting: Family Medicine

## 2019-03-01 DIAGNOSIS — M79642 Pain in left hand: Secondary | ICD-10-CM | POA: Insufficient documentation

## 2019-03-01 DIAGNOSIS — E890 Postprocedural hypothyroidism: Secondary | ICD-10-CM

## 2019-03-01 DIAGNOSIS — E559 Vitamin D deficiency, unspecified: Secondary | ICD-10-CM

## 2019-03-01 DIAGNOSIS — G8929 Other chronic pain: Secondary | ICD-10-CM

## 2019-03-01 LAB — CBC WITH DIFFERENTIAL/PLATELET
Basophils Absolute: 0 10*3/uL (ref 0.0–0.2)
Basos: 1 %
EOS (ABSOLUTE): 0 10*3/uL (ref 0.0–0.4)
Eos: 1 %
Hematocrit: 27.4 % — ABNORMAL LOW (ref 34.0–46.6)
Hemoglobin: 8.7 g/dL — ABNORMAL LOW (ref 11.1–15.9)
Immature Grans (Abs): 0 10*3/uL (ref 0.0–0.1)
Immature Granulocytes: 0 %
Lymphocytes Absolute: 1.2 10*3/uL (ref 0.7–3.1)
Lymphs: 43 %
MCH: 26.7 pg (ref 26.6–33.0)
MCHC: 31.8 g/dL (ref 31.5–35.7)
MCV: 84 fL (ref 79–97)
Monocytes Absolute: 0.3 10*3/uL (ref 0.1–0.9)
Monocytes: 9 %
Neutrophils Absolute: 1.3 10*3/uL — ABNORMAL LOW (ref 1.4–7.0)
Neutrophils: 46 %
Platelets: 295 10*3/uL (ref 150–450)
RBC: 3.26 x10E6/uL — ABNORMAL LOW (ref 3.77–5.28)
RDW: 18.5 % — ABNORMAL HIGH (ref 11.7–15.4)
WBC: 2.8 10*3/uL — ABNORMAL LOW (ref 3.4–10.8)

## 2019-03-01 LAB — COMPREHENSIVE METABOLIC PANEL
ALT: 11 IU/L (ref 0–32)
AST: 22 IU/L (ref 0–40)
Albumin/Globulin Ratio: 1.6 (ref 1.2–2.2)
Albumin: 4.4 g/dL (ref 3.8–4.8)
Alkaline Phosphatase: 67 IU/L (ref 39–117)
BUN/Creatinine Ratio: 21 (ref 9–23)
BUN: 20 mg/dL (ref 6–24)
Bilirubin Total: 0.8 mg/dL (ref 0.0–1.2)
CO2: 24 mmol/L (ref 20–29)
Calcium: 9.4 mg/dL (ref 8.7–10.2)
Chloride: 103 mmol/L (ref 96–106)
Creatinine, Ser: 0.94 mg/dL (ref 0.57–1.00)
GFR calc Af Amer: 84 mL/min/{1.73_m2} (ref >59)
GFR calc non Af Amer: 73 mL/min/{1.73_m2} (ref >59)
Globulin, Total: 2.8 g/dL (ref 1.5–4.5)
Glucose: 87 mg/dL (ref 65–99)
Potassium: 3.5 mmol/L (ref 3.5–5.2)
Sodium: 140 mmol/L (ref 134–144)
Total Protein: 7.2 g/dL (ref 6.0–8.5)

## 2019-03-01 LAB — LIPID PANEL
Chol/HDL Ratio: 3.1 ratio (ref 0.0–4.4)
Cholesterol, Total: 206 mg/dL — ABNORMAL HIGH (ref 100–199)
HDL: 67 mg/dL (ref >39)
LDL Chol Calc (NIH): 120 mg/dL — ABNORMAL HIGH (ref 0–99)
Triglycerides: 108 mg/dL (ref 0–149)
VLDL Cholesterol Cal: 19 mg/dL (ref 5–40)

## 2019-03-01 LAB — IODINE, SERUM/PLASMA: Iodine: 24.5 ug/L — ABNORMAL LOW (ref 40.0–92.0)

## 2019-03-01 LAB — T4, FREE: Free T4: 0.51 ng/dL — ABNORMAL LOW (ref 0.82–1.77)

## 2019-03-01 LAB — VITAMIN D 25 HYDROXY (VIT D DEFICIENCY, FRACTURES): Vit D, 25-Hydroxy: 7.3 ng/mL — ABNORMAL LOW (ref 30.0–100.0)

## 2019-03-01 LAB — TSH: TSH: 54.6 u[IU]/mL — ABNORMAL HIGH (ref 0.450–4.500)

## 2019-03-01 LAB — VITAMIN B12: Vitamin B-12: 357 pg/mL (ref 232–1245)

## 2019-03-01 LAB — T3, FREE: T3, Free: 1.5 pg/mL — ABNORMAL LOW (ref 2.0–4.4)

## 2019-03-01 MED ORDER — VITAMIN D (ERGOCALCIFEROL) 1.25 MG (50000 UNIT) PO CAPS: 50000.0000 [IU] | ORAL_CAPSULE | ORAL | 6 refills | Status: DC

## 2019-03-01 MED ORDER — LEVOTHYROXINE SODIUM 88 MCG PO TABS: 88.0000 ug | ORAL_TABLET | Freq: Every day | ORAL | 2 refills | Status: DC

## 2019-03-01 MED ORDER — LEVOTHYROXINE SODIUM 50 MCG PO TABS: 50.0000 ug | ORAL_TABLET | Freq: Every day | ORAL | 2 refills | Status: DC

## 2019-03-01 NOTE — Telephone Encounter (Signed)
Patient called for her recent lab results (thyroid). She is also out of Thyroid medication. Per patient dosage may need to adjusted before refilling Rx.

## 2019-04-19 ENCOUNTER — Ambulatory Visit: Payer: Medicaid Other | Attending: Family

## 2019-04-19 DIAGNOSIS — Z23 Encounter for immunization: Secondary | ICD-10-CM

## 2019-04-19 NOTE — Progress Notes (Signed)
   Covid-19 Vaccination Clinic  Name:  GLORA HULGAN    MRN: [id number] DOB: [birth year]  04/19/2019  Ms. Pinon was observed post Covid-19 immunization for 15 minutes without incident. She was provided with Vaccine Information Sheet and instruction to access the V-Safe system.   Ms. Cavenaugh was instructed to call 911 with any severe reactions post vaccine: Marland Kitchen Difficulty breathing  . Swelling of face and throat  . A fast heartbeat  . A bad rash all over body  . Dizziness and weakness   Immunizations Administered    Name Date Dose VIS Date Route   Moderna COVID-19 Vaccine 04/19/2019 10:40 AM 0.5 mL 01/09/2019 Intramuscular   Manufacturer: Moderna   Lot: [id number]   NDC: 50932-671-24

## 2019-05-22 ENCOUNTER — Ambulatory Visit: Payer: Medicaid Other | Attending: Family

## 2019-05-22 DIAGNOSIS — Z23 Encounter for immunization: Secondary | ICD-10-CM

## 2019-05-22 NOTE — Progress Notes (Signed)
   Covid-19 Vaccination Clinic  Name:  Lacey Mccarty    MRN: [id number] DOB: 1975-07-18  05/22/2019  Lacey Mccarty was observed post Covid-19 immunization for 15 minutes without incident. She was provided with Vaccine Information Sheet and instruction to access the V-Safe system.   Lacey Mccarty was instructed to call 911 with any severe reactions post vaccine: Marland Kitchen Difficulty breathing  . Swelling of face and throat  . A fast heartbeat  . A bad rash all over body  . Dizziness and weakness   Immunizations Administered    Name Date Dose VIS Date Route   Moderna COVID-19 Vaccine 05/22/2019 11:31 AM 0.5 mL 01/09/2019 Intramuscular   Manufacturer: Moderna   Lot: [id number]   NDC: 32992-426-83

## 2019-06-04 ENCOUNTER — Other Ambulatory Visit: Payer: Self-pay

## 2019-06-04 ENCOUNTER — Encounter: Payer: Self-pay | Admitting: Family Medicine

## 2019-06-04 ENCOUNTER — Ambulatory Visit (INDEPENDENT_AMBULATORY_CARE_PROVIDER_SITE_OTHER): Payer: Self-pay | Admitting: Family Medicine

## 2019-06-04 VITALS — BP 118/78 | HR 89 | Temp 97.7°F | Ht 63.0 in | Wt 193.6 lb

## 2019-06-04 DIAGNOSIS — M79642 Pain in left hand: Secondary | ICD-10-CM

## 2019-06-04 DIAGNOSIS — Z09 Encounter for follow-up examination after completed treatment for conditions other than malignant neoplasm: Secondary | ICD-10-CM

## 2019-06-04 DIAGNOSIS — Z0000 Encounter for general adult medical examination without abnormal findings: Secondary | ICD-10-CM

## 2019-06-04 DIAGNOSIS — E890 Postprocedural hypothyroidism: Secondary | ICD-10-CM

## 2019-06-04 DIAGNOSIS — E559 Vitamin D deficiency, unspecified: Secondary | ICD-10-CM

## 2019-06-04 DIAGNOSIS — G8929 Other chronic pain: Secondary | ICD-10-CM

## 2019-06-04 LAB — POCT GLYCOSYLATED HEMOGLOBIN (HGB A1C): Hemoglobin A1C: 5.2 % (ref 4.0–5.6)

## 2019-06-04 LAB — POCT URINALYSIS DIPSTICK
Bilirubin, UA: NEGATIVE
Blood, UA: NEGATIVE
Glucose, UA: NEGATIVE
Ketones, UA: NEGATIVE
Leukocytes, UA: NEGATIVE
Nitrite, UA: NEGATIVE
Protein, UA: POSITIVE — AB
Spec Grav, UA: >=1.03 — AB (ref 1.010–1.025)
Urobilinogen, UA: 0.2 E.U./dL
pH, UA: 6 (ref 5.0–8.0)

## 2019-06-04 LAB — GLUCOSE, POCT (MANUAL RESULT ENTRY): POC Glucose: 107 mg/dl — AB (ref 70–99)

## 2019-06-04 NOTE — Progress Notes (Signed)
[facility]  Established Patient Office Visit  Subjective:  Patient ID: Lacey Mccarty, female    DOB: 1975/02/17  Age: 44 y.o. MRN: [id number]  CC:  Chief Complaint  Patient presents with  . Follow-up    HPI Lacey Mccarty is a 44 year old female who presents for Follow Up today.   Past Medical History:  Diagnosis Date  . Asthma   . Chronic back pain   . Graves disease    I-131 ablation late 07/2012  . Hypothyroidism   . Mediastinal mass   . 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 Recent X-ray was negative. She states that she has constant pain mainly in her left thumb. She denies fevers, chills, fatigue, recent infections, weight loss, and night sweats. She has not had any headaches, visual changes, dizziness, and falls. No chest pain, heart palpitations, cough and shortness of breath reported. Denies GI problems such as nausea, vomiting, diarrhea, and constipation. She has no reports of blood in stools, dysuria and hematuria. No depression or anxiety, and denies suicidal ideations, homicidal ideations, or auditory hallucinations. She is taking all medications as prescribed. She denies pain today.    Past Surgical History:  Procedure Laterality Date  . CESAREAN SECTION    . LIPOSUCTION    . NM THYROID STIM SUPRESS      Family History  Problem Relation Age of Onset  . HIV Mother   . Diabetes Mellitus II Mother   . Hypertension Father   . Cancer Father        Lung  . Allergies Child   . Allergies Child   . Colon cancer Paternal Grandmother   . Stomach cancer Paternal Grandmother   . Breast cancer Maternal Aunt   . Asthma Maternal Aunt     Social History   Socioeconomic History  . Marital status: Single    Spouse name: Not on file  . Number of children: Not on file  . Years of education: Not on file  . Highest education level: Not on  file  Occupational History  . Not on file  Tobacco Use  . Smoking status: Former Smoker    Packs/day: 0.25    Years: 7.00    Pack years: 1.75    Quit date: 09/14/2013    Years since quitting: 5.7  . Smokeless tobacco: Never Used  Substance and Sexual Activity  . Alcohol use: Yes    Comment: occasional  . Drug use: No  . Sexual activity: Yes  Other Topics Concern  . Not on file  Social History Narrative   ** Merged History Encounter **      Regular exercise: no   Caffeine use: none       Social Determinants of Corporate investment banker Strain:   . Difficulty of Paying Living Expenses:   Food Insecurity:   . Worried About Programme researcher, broadcasting/film/video in the Last Year:   . Barista in the Last Year:   Transportation Needs:   . Freight forwarder (Medical):   Marland Kitchen Lack of Transportation (Non-Medical):   Physical Activity:   . Days of Exercise per Week:   . Minutes of Exercise per Session:   Stress:   . Feeling of Stress :   Social Connections:   . Frequency of Communication with Friends and Family:   .  Frequency of Social Gatherings with Friends and Family:   . Attends Religious Services:   . Active Member of Clubs or Organizations:   . Attends Banker Meetings:   Marland Kitchen Marital Status:   Intimate Partner Violence:   . Fear of Current or Ex-Partner:   . Emotionally Abused:   Marland Kitchen Physically Abused:   . Sexually Abused:     Outpatient Medications Prior to Visit  Medication Sig Dispense Refill  . levothyroxine (SYNTHROID) 50 MCG tablet Take 1 tablet (50 mcg total) by mouth daily. Take with Synthroid 88 mcg (138 mcg= Total Dosage), by mouth, daily. 30 tablet 2  . levothyroxine (SYNTHROID) 88 MCG tablet Take 1 tablet (88 mcg total) by mouth daily. Take with Synthroid 50 mcg (138 mcg= Total Dosage), by mouth, daily. 30 tablet 2  . montelukast (SINGULAIR) 10 MG tablet Take 1 tablet (10 mg total) by mouth at bedtime. 30 tablet 2  . Vitamin D, Ergocalciferol,  (DRISDOL) 1.25 MG (50000 UNIT) CAPS capsule Take 1 capsule (50,000 Units total) by mouth every 7 (seven) days. 5 capsule 6   No facility-administered medications prior to visit.    Allergies  Allergen Reactions  . Tapazole [Methimazole] Hives and Shortness Of Breath    ROS Review of Systems  Constitutional: Negative.   HENT: Negative.   Eyes: Negative.   Respiratory: Negative.   Cardiovascular: Negative.   Gastrointestinal: Negative.   Endocrine: Negative.   Genitourinary: Negative.   Musculoskeletal:       Left hand pain  Skin: Negative.   Allergic/Immunologic: Negative.   Neurological: Negative.   Hematological: Negative.   Psychiatric/Behavioral: Negative.       Objective:    Physical Exam  Constitutional: She is oriented to person, place, and time. She appears well-developed and well-nourished.  HENT:  Head: Normocephalic and atraumatic.  Eyes: Conjunctivae are normal.  Cardiovascular: Normal rate, regular rhythm, normal heart sounds and intact distal pulses.  Pulmonary/Chest: Effort normal and breath sounds normal.  Abdominal: Soft. Bowel sounds are normal.  Musculoskeletal:     Cervical back: Normal range of motion and neck supple.     Comments: Limited ROM in left hand.   Neurological: She is alert and oriented to person, place, and time. She has normal reflexes.  Skin: Skin is warm and dry.  Psychiatric: She has a normal mood and affect. Her behavior is normal. Judgment and thought content normal.  Nursing note and vitals reviewed.   BP 118/78   Pulse 89   Temp 97.7 F (36.5 C)   Ht 5\' 3"  (1.6 m)   Wt 193 lb 9.6 oz (87.8 kg)   LMP 05/24/2019   SpO2 100%   BMI 34.29 kg/m  Wt Readings from Last 3 Encounters:  06/04/19 193 lb 9.6 oz (87.8 kg)  02/26/19 196 lb 3.2 oz (89 kg)  04/29/17 194 lb 4 oz (88.1 kg)     There are no preventive care reminders to display for this patient.  There are no preventive care reminders to display for this  patient.  Lab Results  Component Value Date   TSH 54.600 (H) 02/26/2019   Lab Results  Component Value Date   WBC 2.8 (L) 02/26/2019   HGB 8.7 (L) 02/26/2019   HCT 27.4 (L) 02/26/2019   MCV 84 02/26/2019   PLT 295 02/26/2019   Lab Results  Component Value Date   NA 140 02/26/2019   K 3.5 02/26/2019   CO2 24 02/26/2019   GLUCOSE 87  02/26/2019   BUN 20 02/26/2019   CREATININE 0.94 02/26/2019   BILITOT 0.8 02/26/2019   ALKPHOS 67 02/26/2019   AST 22 02/26/2019   ALT 11 02/26/2019   PROT 7.2 02/26/2019   ALBUMIN 4.4 02/26/2019   CALCIUM 9.4 02/26/2019   ANIONGAP 8 04/22/2017   GFR 119.38 03/19/2016   Lab Results  Component Value Date   CHOL 206 (H) 02/26/2019   Lab Results  Component Value Date   HDL 67 02/26/2019   Lab Results  Component Value Date   LDLCALC 120 (H) 02/26/2019   Lab Results  Component Value Date   TRIG 108 02/26/2019   Lab Results  Component Value Date   CHOLHDL 3.1 02/26/2019   Lab Results  Component Value Date   HGBA1C 5.2 06/04/2019      Assessment & Plan:   1. Chronic hand pain, left - CT HAND LEFT W WO CONTRAST; Future  2. Hypothyroidism following radioiodine therapy Stable. We will re-assess TSH. Continue Synthroid as prescribed.   3. Vitamin D deficiency  4. Healthcare maintenance - POCT urinalysis dipstick - POCT glucose (manual entry) - POCT glycosylated hemoglobin (Hb A1C)  5. Follow up She will follow up in 6 months.  No orders of the defined types were placed in this encounter.   Orders Placed This Encounter  Procedures  . POCT urinalysis dipstick  . POCT glucose (manual entry)  . POCT glycosylated hemoglobin (Hb A1C)    Referral Orders  No referral(s) requested today    Kathe Becton,  MSN, FNP-BC [facility] [address] [place], [place] [address] [phone] fax    Problem List Items Addressed This Visit       Endocrine   Hypothyroidism following radioiodine therapy    Other Visit Diagnoses    Chronic hand pain, left    -  Primary   Vitamin D deficiency       Healthcare maintenance       Relevant Orders   POCT urinalysis dipstick (Completed)   POCT glucose (manual entry) (Completed)   POCT glycosylated hemoglobin (Hb A1C) (Completed)   Follow up          No orders of the defined types were placed in this encounter.   Follow-up: Return in about 6 months (around 12/04/2019).    Azzie Glatter, FNP

## 2019-06-05 ENCOUNTER — Telehealth: Payer: Self-pay

## 2019-06-05 NOTE — Telephone Encounter (Signed)
Per Dorene Grebe NP: Please schedule patient for Lab Only to recheck her Thyroid level in 1 month.

## 2019-06-06 ENCOUNTER — Telehealth: Payer: Self-pay | Admitting: Family Medicine

## 2019-06-06 NOTE — Telephone Encounter (Signed)
Called Pt to Schedule appointment for Thyroid level she says she did not have time to schedule a appointment

## 2019-06-08 ENCOUNTER — Telehealth: Payer: Self-pay | Admitting: Family Medicine

## 2019-06-08 NOTE — Telephone Encounter (Signed)
Pt stated she didn't have time to talk about her Thyroid  and she would call us back

## 2019-06-14 ENCOUNTER — Ambulatory Visit (HOSPITAL_COMMUNITY): Admission: RE | Admit: 2019-06-14 | Payer: Self-pay | Source: Ambulatory Visit

## 2019-06-14 ENCOUNTER — Other Ambulatory Visit: Payer: Self-pay

## 2019-06-14 ENCOUNTER — Encounter (HOSPITAL_COMMUNITY): Payer: Self-pay

## 2019-06-14 ENCOUNTER — Telehealth: Payer: Self-pay | Admitting: Family Medicine

## 2019-06-14 ENCOUNTER — Other Ambulatory Visit: Payer: Medicaid Other

## 2019-06-14 ENCOUNTER — Ambulatory Visit (HOSPITAL_COMMUNITY)
Admission: RE | Admit: 2019-06-14 | Discharge: 2019-06-14 | Disposition: A | Discharge status: Home / Self Care | Payer: Self-pay | Source: Ambulatory Visit | Attending: Family Medicine | Admitting: Family Medicine

## 2019-06-14 DIAGNOSIS — G8929 Other chronic pain: Secondary | ICD-10-CM

## 2019-06-14 DIAGNOSIS — M79642 Pain in left hand: Secondary | ICD-10-CM

## 2019-06-14 DIAGNOSIS — E890 Postprocedural hypothyroidism: Secondary | ICD-10-CM

## 2019-06-15 LAB — TSH: TSH: 70.1 u[IU]/mL — ABNORMAL HIGH (ref 0.450–4.500)

## 2019-06-15 NOTE — Telephone Encounter (Signed)
DoNE.  

## 2019-06-18 ENCOUNTER — Other Ambulatory Visit: Payer: Self-pay | Admitting: Family Medicine

## 2019-06-18 DIAGNOSIS — E890 Postprocedural hypothyroidism: Secondary | ICD-10-CM

## 2019-06-18 DIAGNOSIS — G8929 Other chronic pain: Secondary | ICD-10-CM

## 2019-06-18 DIAGNOSIS — M79642 Pain in left hand: Secondary | ICD-10-CM

## 2019-06-18 MED ORDER — PREDNISONE 10 MG PO TABS: ORAL_TABLET | ORAL | 0 refills | Status: DC

## 2019-06-26 ENCOUNTER — Ambulatory Visit: Payer: Medicaid Other | Admitting: Orthopaedic Surgery

## 2019-07-04 ENCOUNTER — Ambulatory Visit: Payer: Medicaid Other | Admitting: Orthopaedic Surgery

## 2019-08-20 ENCOUNTER — Other Ambulatory Visit: Payer: Medicaid Other

## 2019-10-25 ENCOUNTER — Telehealth: Payer: Self-pay | Admitting: Family Medicine

## 2019-10-25 NOTE — Telephone Encounter (Signed)
Patient called to request a letter stating that she should not go to a courthouse due to the potential of getting Covid. She asked that the letter be produced same day for a court date on 9/17. She was informed that we require 7-14 days to complete forms or letters. She asked for an appointment to discuss with Raliegh Ip, but there were no openings on 9/17. Patient added her representative to the call to let him know that the provider was not available to complete a letter with less than 24 hour notice.

## 2019-10-29 ENCOUNTER — Other Ambulatory Visit: Payer: Self-pay | Admitting: Family Medicine

## 2019-12-04 ENCOUNTER — Ambulatory Visit: Payer: Medicaid Other | Admitting: Family Medicine

## 2019-12-11 ENCOUNTER — Other Ambulatory Visit: Payer: Self-pay

## 2019-12-11 ENCOUNTER — Encounter: Payer: Self-pay | Admitting: Family Medicine

## 2019-12-11 ENCOUNTER — Ambulatory Visit (INDEPENDENT_AMBULATORY_CARE_PROVIDER_SITE_OTHER): Payer: Self-pay | Admitting: Family Medicine

## 2019-12-11 VITALS — BP 130/90 | HR 68 | Temp 98.0°F | Resp 20 | Ht 63.0 in | Wt 194.6 lb

## 2019-12-11 DIAGNOSIS — M79642 Pain in left hand: Secondary | ICD-10-CM

## 2019-12-11 DIAGNOSIS — Z09 Encounter for follow-up examination after completed treatment for conditions other than malignant neoplasm: Secondary | ICD-10-CM

## 2019-12-11 DIAGNOSIS — E890 Postprocedural hypothyroidism: Secondary | ICD-10-CM

## 2019-12-11 DIAGNOSIS — G8929 Other chronic pain: Secondary | ICD-10-CM

## 2019-12-11 DIAGNOSIS — Z760 Encounter for issue of repeat prescription: Secondary | ICD-10-CM

## 2019-12-11 MED ORDER — LEVOTHYROXINE SODIUM 88 MCG PO TABS: 88.0000 ug | ORAL_TABLET | Freq: Every day | ORAL | 3 refills | Status: DC

## 2019-12-11 MED ORDER — LEVOTHYROXINE SODIUM 50 MCG PO TABS: 50.0000 ug | ORAL_TABLET | Freq: Every day | ORAL | 3 refills | Status: DC

## 2019-12-11 NOTE — Progress Notes (Signed)
[facility]   Established Patient Office Visit  Subjective:  Patient ID: Lacey Mccarty, female    DOB: 1975/10/02  Age: 44 y.o. MRN: [id number]  CC: No chief complaint on file.   HPI Lacey Mccarty is a 44 year old female who presents for Follow Up today.   Patient Active Problem List   Diagnosis Date Noted  . Hypothyroidism following radioiodine therapy 10/11/2012  . Allergy-induced asthma 08/30/2012  . Tobacco abuse 03/21/2012   Current Status: Since her last office visit, she is doing well with no complaints. She continues Thyroid medication daily as prescribed. She denies fatigue, inability to tolerate cold, unexplained weight loss, dry skin, heart palpitations, chest pain, coarse hair, decrease thought process, constipation, depression. She denies fevers, chills, fatigue, recent infections, weight loss, and night sweats. She has not had any headaches, visual changes, dizziness, and falls. No chest pain, heart palpitations, cough and shortness of breath reported. Denies GI problems such as nausea, vomiting, diarrhea, and constipation. She has no reports of blood in stools, dysuria and hematuria. No depression or anxiety, and denies suicidal ideations, homicidal ideations, or auditory hallucinations. She is taking all medications as prescribed. She denies pain today.   Past Medical History:  Diagnosis Date  . Asthma   . Chronic back pain   . Graves disease    I-131 ablation late 07/2012  . Hypothyroidism   . Mediastinal mass   . 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 CESAREAN SECTION    . LIPOSUCTION    . NM THYROID STIM SUPRESS      Family History  Problem Relation Age of Onset  . HIV Mother   . Diabetes Mellitus II Mother   . Hypertension Father   . Cancer Father        Lung  . Allergies Child   . Allergies Child   . Colon cancer Paternal Grandmother   . Stomach  cancer Paternal Grandmother   . Breast cancer Maternal Aunt   . Asthma Maternal Aunt     Social History   Socioeconomic History  . Marital status: Single    Spouse name: Not on file  . Number of children: Not on file  . Years of education: Not on file  . Highest education level: Not on file  Occupational History  . Not on file  Tobacco Use  . Smoking status: Former Smoker    Packs/day: 0.25    Years: 7.00    Pack years: 1.75    Quit date: 09/14/2013    Years since quitting: 6.2  . Smokeless tobacco: Never Used  Vaping Use  . Vaping Use: Never used  Substance and Sexual Activity  . Alcohol use: Yes    Comment: occasional  . Drug use: No  . Sexual activity: Yes  Other Topics Concern  . Not on file  Social History Narrative   ** Merged History Encounter **      Regular exercise: no   Caffeine use: none       Social Determinants of Corporate investment banker Strain:   . Difficulty of Paying Living Expenses: Not on file  Food Insecurity:   . Worried About Programme researcher, broadcasting/film/video in the Last Year: Not on file  . Ran Out of Food in the Last Year: Not on file  Transportation Needs:   . Lack of Transportation (Medical): Not on  file  . Lack of Transportation (Non-Medical): Not on file  Physical Activity:   . Days of Exercise per Week: Not on file  . Minutes of Exercise per Session: Not on file  Stress:   . Feeling of Stress : Not on file  Social Connections:   . Frequency of Communication with Friends and Family: Not on file  . Frequency of Social Gatherings with Friends and Family: Not on file  . Attends Religious Services: Not on file  . Active Member of Clubs or Organizations: Not on file  . Attends BankerClub or Organization Meetings: Not on file  . Marital Status: Not on file  Intimate Partner Violence:   . Fear of Current or Ex-Partner: Not on file  . Emotionally Abused: Not on file  . Physically Abused: Not on file  . Sexually Abused: Not on file    Outpatient  Medications Prior to Visit  Medication Sig Dispense Refill  . montelukast (SINGULAIR) 10 MG tablet Take 1 tablet (10 mg total) by mouth at bedtime. 30 tablet 2  . predniSONE (DELTASONE) 10 MG tablet Day #1: Take 6 tablets by mouth Day #2: Take 5 tablets by mouth Day #3: Take 4 tablets by mouth Day #4: Take 3 tablets by mouth Day #5: Take 2 tablets by mouth Day #6: Take 1 tablet, then complete. 21 tablet 0  . Vitamin D, Ergocalciferol, (DRISDOL) 1.25 MG (50000 UNIT) CAPS capsule Take 1 capsule (50,000 Units total) by mouth every 7 (seven) days. 5 capsule 6  . levothyroxine (SYNTHROID) 50 MCG tablet Take 1 tablet (50 mcg total) by mouth daily. Take with Synthroid 88 mcg (138 mcg= Total Dosage), by mouth, daily. 30 tablet 2  . levothyroxine (SYNTHROID) 88 MCG tablet Take 1 tablet (88 mcg total) by mouth daily. Take with Synthroid 50 mcg (138 mcg= Total Dosage), by mouth, daily. 30 tablet 2   No facility-administered medications prior to visit.    Allergies  Allergen Reactions  . Tapazole [Methimazole] Hives and Shortness Of Breath    ROS Review of Systems  Constitutional: Negative.   HENT: Negative.   Eyes: Negative.   Respiratory: Negative.   Cardiovascular: Negative.   Gastrointestinal: Positive for abdominal distention (obese).  Endocrine: Negative.   Genitourinary: Negative.   Musculoskeletal: Positive for arthralgias (generalized. ).  Allergic/Immunologic: Negative.   Neurological: Positive for dizziness (occasional ) and headaches (occasional ).  Hematological: Negative.   Psychiatric/Behavioral: Negative.       Objective:    Physical Exam Vitals and nursing note reviewed.  Constitutional:      Appearance: Normal appearance.  HENT:     Head: Normocephalic and atraumatic.     Nose: Nose normal.     Mouth/Throat:     Mouth: Mucous membranes are moist.  Cardiovascular:     Rate and Rhythm: Normal rate and regular rhythm.     Pulses: Normal pulses.     Heart  sounds: Normal heart sounds.  Pulmonary:     Effort: Pulmonary effort is normal.     Breath sounds: Normal breath sounds.  Abdominal:     General: Bowel sounds are normal. There is distension (obese).     Palpations: Abdomen is soft.  Musculoskeletal:        General: Normal range of motion.     Cervical back: Normal range of motion and neck supple.  Skin:    General: Skin is warm and dry.  Neurological:     General: No focal deficit present.  Mental Status: She is alert and oriented to person, place, and time.  Psychiatric:        Mood and Affect: Mood normal.        Behavior: Behavior normal.        Thought Content: Thought content normal.        Judgment: Judgment normal.     BP 130/90   Pulse 68   Temp 98 F (36.7 C)   Resp 20   Ht 5\' 3"  (1.6 m)   Wt 194 lb 9.6 oz (88.3 kg)   SpO2 98%   BMI 34.47 kg/m  Wt Readings from Last 3 Encounters:  12/11/19 194 lb 9.6 oz (88.3 kg)  06/04/19 193 lb 9.6 oz (87.8 kg)  02/26/19 196 lb 3.2 oz (89 kg)     Health Maintenance Due  Topic Date Due  . Hepatitis C Screening  Never done  . INFLUENZA VACCINE  Never done    There are no preventive care reminders to display for this patient.  Lab Results  Component Value Date   TSH 70.100 (H) 06/14/2019   Lab Results  Component Value Date   WBC 2.8 (L) 02/26/2019   HGB 8.7 (L) 02/26/2019   HCT 27.4 (L) 02/26/2019   MCV 84 02/26/2019   PLT 295 02/26/2019   Lab Results  Component Value Date   NA 140 02/26/2019   K 3.5 02/26/2019   CO2 24 02/26/2019   GLUCOSE 87 02/26/2019   BUN 20 02/26/2019   CREATININE 0.94 02/26/2019   BILITOT 0.8 02/26/2019   ALKPHOS 67 02/26/2019   AST 22 02/26/2019   ALT 11 02/26/2019   PROT 7.2 02/26/2019   ALBUMIN 4.4 02/26/2019   CALCIUM 9.4 02/26/2019   ANIONGAP 8 04/22/2017   GFR 119.38 03/19/2016   Lab Results  Component Value Date   CHOL 206 (H) 02/26/2019   Lab Results  Component Value Date   HDL 67 02/26/2019   Lab  Results  Component Value Date   LDLCALC 120 (H) 02/26/2019   Lab Results  Component Value Date   TRIG 108 02/26/2019   Lab Results  Component Value Date   CHOLHDL 3.1 02/26/2019   Lab Results  Component Value Date   HGBA1C 5.2 06/04/2019      Assessment & Plan:   1. Hypothyroidism following radioiodine therapy - levothyroxine (SYNTHROID) 50 MCG tablet; Take 1 tablet (50 mcg total) by mouth daily. Take with Synthroid 88 mcg (138 mcg= Total Dosage), by mouth, daily.  Dispense: 90 tablet; Refill: 3 - levothyroxine (SYNTHROID) 88 MCG tablet; Take 1 tablet (88 mcg total) by mouth daily. Take with Synthroid 50 mcg (138 mcg= Total Dosage), by mouth, daily.  Dispense: 90 tablet; Refill: 3  2. Chronic hand pain, left  3. Medication refill - levothyroxine (SYNTHROID) 50 MCG tablet; Take 1 tablet (50 mcg total) by mouth daily. Take with Synthroid 88 mcg (138 mcg= Total Dosage), by mouth, daily.  Dispense: 90 tablet; Refill: 3 - levothyroxine (SYNTHROID) 88 MCG tablet; Take 1 tablet (88 mcg total) by mouth daily. Take with Synthroid 50 mcg (138 mcg= Total Dosage), by mouth, daily.  Dispense: 90 tablet; Refill: 3  4. Follow up She will follow up in 05/2020 for her Annual Physical and Labs.   Meds ordered this encounter  Medications  . levothyroxine (SYNTHROID) 50 MCG tablet    Sig: Take 1 tablet (50 mcg total) by mouth daily. Take with Synthroid 88 mcg (138 mcg= Total Dosage), by mouth,  daily.    Dispense:  90 tablet    Refill:  3  . levothyroxine (SYNTHROID) 88 MCG tablet    Sig: Take 1 tablet (88 mcg total) by mouth daily. Take with Synthroid 50 mcg (138 mcg= Total Dosage), by mouth, daily.    Dispense:  90 tablet    Refill:  3    No orders of the defined types were placed in this encounter.  Referral Orders  No referral(s) requested today    Raliegh Ip,  MSN, FNP-BC [facility] [address] [place], [place] [address] [phone] fax  Problem List Items Addressed This Visit      Endocrine   Hypothyroidism following radioiodine therapy - Primary   Relevant Medications   levothyroxine (SYNTHROID) 50 MCG tablet   levothyroxine (SYNTHROID) 88 MCG tablet    Other Visit Diagnoses    Chronic hand pain, left       Medication refill       Relevant Medications   levothyroxine (SYNTHROID) 50 MCG tablet   levothyroxine (SYNTHROID) 88 MCG tablet   Follow up          Meds ordered this encounter  Medications  . levothyroxine (SYNTHROID) 50 MCG tablet    Sig: Take 1 tablet (50 mcg total) by mouth daily. Take with Synthroid 88 mcg (138 mcg= Total Dosage), by mouth, daily.    Dispense:  90 tablet    Refill:  3  . levothyroxine (SYNTHROID) 88 MCG tablet    Sig: Take 1 tablet (88 mcg total) by mouth daily. Take with Synthroid 50 mcg (138 mcg= Total Dosage), by mouth, daily.    Dispense:  90 tablet    Refill:  3    Follow-up: No follow-ups on file.    Kallie Locks, FNP

## 2019-12-17 ENCOUNTER — Encounter: Payer: Self-pay | Admitting: Family Medicine

## 2020-01-08 ENCOUNTER — Telehealth (INDEPENDENT_AMBULATORY_CARE_PROVIDER_SITE_OTHER): Payer: Self-pay | Admitting: Family Medicine

## 2020-01-08 DIAGNOSIS — Z09 Encounter for follow-up examination after completed treatment for conditions other than malignant neoplasm: Secondary | ICD-10-CM

## 2020-01-08 DIAGNOSIS — R059 Cough, unspecified: Secondary | ICD-10-CM

## 2020-01-08 DIAGNOSIS — B349 Viral infection, unspecified: Secondary | ICD-10-CM

## 2020-01-08 DIAGNOSIS — J029 Acute pharyngitis, unspecified: Secondary | ICD-10-CM

## 2020-01-08 DIAGNOSIS — Z760 Encounter for issue of repeat prescription: Secondary | ICD-10-CM

## 2020-01-08 MED ORDER — AMOXICILLIN-POT CLAVULANATE 875-125 MG PO TABS: 1.0000 | ORAL_TABLET | Freq: Two times a day (BID) | ORAL | 0 refills | Status: AC

## 2020-01-08 MED ORDER — BENZONATATE 100 MG PO CAPS: 100.0000 mg | ORAL_CAPSULE | Freq: Two times a day (BID) | ORAL | 0 refills | Status: DC | PRN

## 2020-01-08 NOTE — Progress Notes (Signed)
Virtual Visit via Telephone Note  I connected with Lacey Mccarty on 01/10/20 at  3:20 PM EST by telephone and verified that I am speaking with the correct person using two identifiers.  Location: Patient: Home Provider: Office   I discussed the limitations, risks, security and privacy concerns of performing an evaluation and management service by telephone and the availability of in person appointments. I also discussed with the patient that there may be a patient responsible charge related to this service. The patient expressed understanding and agreed to proceed.   History of Present Illness:  Patient Active Problem List   Diagnosis Date Noted  . Hypothyroidism following radioiodine therapy 10/11/2012  . Allergy-induced asthma 08/30/2012  . Tobacco abuse 03/21/2012    Current Status: Since her last office visit, she has c/o cough and very sore throat X 3 days. She has not been taking any medication for relief of symptoms. She denies fevers, chills, fatigue, recent infections, weight loss, and night sweats. She has not had any headaches, visual changes, dizziness, and falls. No chest pain, heart palpitations, cough and shortness of breath reported. Denies GI problems such as nausea, vomiting, diarrhea, and constipation. She has no reports of blood in stools, dysuria and hematuria. No depression or anxiety, and denies suicidal ideations, homicidal ideations, or auditory hallucinations. She is taking all medications as prescribed.   Observations/Objective:  Telephone Visit   Assessment and Plan:  1. Viral infection - benzonatate (TESSALON) 100 MG capsule; Take 1 capsule (100 mg total) by mouth 2 (two) times daily as needed for cough.  Dispense: 20 capsule; Refill: 0 - amoxicillin-clavulanate (AUGMENTIN) 875-125 MG tablet; Take 1 tablet by mouth 2 (two) times daily for 7 days.  Dispense: 14 tablet; Refill: 0  2. Cough - benzonatate (TESSALON) 100 MG capsule; Take 1 capsule (100 mg  total) by mouth 2 (two) times daily as needed for cough.  Dispense: 20 capsule; Refill: 0 - amoxicillin-clavulanate (AUGMENTIN) 875-125 MG tablet; Take 1 tablet by mouth 2 (two) times daily for 7 days.  Dispense: 14 tablet; Refill: 0  3. Sore throat  4. Medication refill  5. Follow up He will follow up in 05/2020   Meds ordered this encounter  Medications  . benzonatate (TESSALON) 100 MG capsule    Sig: Take 1 capsule (100 mg total) by mouth 2 (two) times daily as needed for cough.    Dispense:  20 capsule    Refill:  0  . amoxicillin-clavulanate (AUGMENTIN) 875-125 MG tablet    Sig: Take 1 tablet by mouth 2 (two) times daily for 7 days.    Dispense:  14 tablet    Refill:  0    No orders of the defined types were placed in this encounter.   Referral Orders  No referral(s) requested today    Raliegh Ip, MSN, ANE, FNP-BC [facility]/Internal Medicine/Sickle Cell Center [facility] [address] [place], [place] [address] [phone] fax   I discussed the assessment and treatment plan with the patient. The patient was provided an opportunity to ask questions and all were answered. The patient agreed with the plan and demonstrated an understanding of the instructions.   The patient was advised to call back or seek an in-person evaluation if the symptoms worsen or if the condition fails to improve as anticipated.  I provided 20 minutes of non-face-to-face time during this encounter.   Kallie Locks, FNP

## 2020-01-10 ENCOUNTER — Other Ambulatory Visit: Payer: Medicaid Other

## 2020-01-10 ENCOUNTER — Encounter: Payer: Self-pay | Admitting: Family Medicine

## 2020-05-09 DIAGNOSIS — N920 Excessive and frequent menstruation with regular cycle: Secondary | ICD-10-CM

## 2020-05-09 DIAGNOSIS — N946 Dysmenorrhea, unspecified: Secondary | ICD-10-CM

## 2020-05-09 DIAGNOSIS — D72819 Decreased white blood cell count, unspecified: Secondary | ICD-10-CM | POA: Insufficient documentation

## 2020-05-09 DIAGNOSIS — D509 Iron deficiency anemia, unspecified: Secondary | ICD-10-CM | POA: Insufficient documentation

## 2020-05-09 DIAGNOSIS — E559 Vitamin D deficiency, unspecified: Secondary | ICD-10-CM | POA: Insufficient documentation

## 2020-05-09 HISTORY — DX: Dysmenorrhea, unspecified: N94.6

## 2020-05-09 HISTORY — DX: Excessive and frequent menstruation with regular cycle: N92.0

## 2020-05-14 ENCOUNTER — Ambulatory Visit (INDEPENDENT_AMBULATORY_CARE_PROVIDER_SITE_OTHER): Payer: Self-pay | Admitting: Family Medicine

## 2020-05-14 ENCOUNTER — Other Ambulatory Visit: Payer: Self-pay

## 2020-05-14 ENCOUNTER — Encounter: Payer: Self-pay | Admitting: Family Medicine

## 2020-05-14 VITALS — BP 128/84 | HR 65 | Ht 63.0 in | Wt 193.0 lb

## 2020-05-14 DIAGNOSIS — Z0000 Encounter for general adult medical examination without abnormal findings: Secondary | ICD-10-CM

## 2020-05-14 DIAGNOSIS — R221 Localized swelling, mass and lump, neck: Secondary | ICD-10-CM

## 2020-05-14 DIAGNOSIS — Z124 Encounter for screening for malignant neoplasm of cervix: Secondary | ICD-10-CM

## 2020-05-14 DIAGNOSIS — R490 Dysphonia: Secondary | ICD-10-CM

## 2020-05-14 DIAGNOSIS — Z760 Encounter for issue of repeat prescription: Secondary | ICD-10-CM

## 2020-05-14 DIAGNOSIS — E890 Postprocedural hypothyroidism: Secondary | ICD-10-CM

## 2020-05-14 DIAGNOSIS — Z09 Encounter for follow-up examination after completed treatment for conditions other than malignant neoplasm: Secondary | ICD-10-CM

## 2020-05-14 NOTE — Progress Notes (Signed)
[facility]   Established Patient Office Visit  Subjective:  Patient ID: Lacey Mccarty, female    DOB: 07/27/1975  Age: 45 y.o. MRN: [id number]  CC:  Chief Complaint  Patient presents with  . Hyperthyroidism    Voice changes    HPI Lacey Mccarty is a 45 year old female who presents for Follow Up today.   Patient Active Problem List   Diagnosis Date Noted  . Hypothyroidism following radioiodine therapy 10/11/2012  . Allergy-induced asthma 08/30/2012  . Tobacco abuse 03/21/2012   Current Status: Since her last office visit, she states that she has been having swollen neck and hoarseness X 3 weeks now. She is current on 138 mcg of Levothyroxine. She denies any pain with swallowing, size increase, additional palpable nodules, pain, or swelling. She denies fevers, chills, fatigue, recent infections, weight loss, and night sweats. She has not had any headaches, visual changes, dizziness, and falls. No chest pain, heart palpitations, cough and shortness of breath reported. Denies GI problems such as nausea, vomiting, diarrhea, and constipation. She has no reports of blood in stools, dysuria and hematuria. No depression or anxiety reported today. She is taking all medications as prescribed. She denies pain today.   Past Medical History:  Diagnosis Date  . Anemia 05/2018  . Asthma   . Chronic back pain   . Dysmenorrhea 05/2020  . Graves disease    I-131 ablation late 07/2012  . Hypothyroidism   . Mediastinal mass   . Menorrhagia with regular cycle 05/2020  . 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 CESAREAN SECTION    . LIPOSUCTION    . NM THYROID STIM SUPRESS      Family History  Problem Relation Age of Onset  . HIV Mother   . Diabetes Mellitus II Mother   . Hypertension Father   . Cancer Father        Lung  . Allergies Child   . Allergies Child   . Colon cancer  Paternal Grandmother   . Stomach cancer Paternal Grandmother   . Breast cancer Maternal Aunt   . Asthma Maternal Aunt     Social History   Socioeconomic History  . Marital status: Single    Spouse name: Not on file  . Number of children: Not on file  . Years of education: Not on file  . Highest education level: Not on file  Occupational History  . Not on file  Tobacco Use  . Smoking status: Former Smoker    Packs/day: 0.25    Years: 7.00    Pack years: 1.75    Quit date: 09/14/2013    Years since quitting: 6.6  . Smokeless tobacco: Never Used  Vaping Use  . Vaping Use: Never used  Substance and Sexual Activity  . Alcohol use: Yes    Comment: occasional  . Drug use: No  . Sexual activity: Yes  Other Topics Concern  . Not on file  Social History Narrative   ** Merged History Encounter **      Regular exercise: no   Caffeine use: none       Social Determinants of Corporate investment banker Strain: Not on file  Food Insecurity: Not on file  Transportation Needs: Not on file  Physical Activity: Not on file  Stress: Not on file  Social Connections: Not on file  Intimate Partner Violence:  Not on file    Outpatient Medications Prior to Visit  Medication Sig Dispense Refill  . levothyroxine (SYNTHROID) 50 MCG tablet Take 1 tablet (50 mcg total) by mouth daily. Take with Synthroid 88 mcg (138 mcg= Total Dosage), by mouth, daily. 90 tablet 3  . levothyroxine (SYNTHROID) 88 MCG tablet Take 1 tablet (88 mcg total) by mouth daily. Take with Synthroid 50 mcg (138 mcg= Total Dosage), by mouth, daily. 90 tablet 3  . montelukast (SINGULAIR) 10 MG tablet Take 1 tablet (10 mg total) by mouth at bedtime. 30 tablet 2  . Vitamin D, Ergocalciferol, (DRISDOL) 1.25 MG (50000 UNIT) CAPS capsule Take 1 capsule (50,000 Units total) by mouth every 7 (seven) days. 5 capsule 6  . benzonatate (TESSALON) 100 MG capsule Take 1 capsule (100 mg total) by mouth 2 (two) times daily as needed for  cough. 20 capsule 0  . predniSONE (DELTASONE) 10 MG tablet Day #1: Take 6 tablets by mouth Day #2: Take 5 tablets by mouth Day #3: Take 4 tablets by mouth Day #4: Take 3 tablets by mouth Day #5: Take 2 tablets by mouth Day #6: Take 1 tablet, then complete. 21 tablet 0   No facility-administered medications prior to visit.    Allergies  Allergen Reactions  . Tapazole [Methimazole] Hives and Shortness Of Breath    ROS Review of Systems  Constitutional: Negative.   HENT: Positive for voice change (hoarness).   Eyes: Negative.   Respiratory: Negative.   Cardiovascular: Negative.   Gastrointestinal: Negative.   Endocrine: Negative.   Genitourinary: Negative.   Musculoskeletal: Negative.   Skin: Negative.   Allergic/Immunologic: Negative.   Neurological: Negative.   Hematological: Negative.   Psychiatric/Behavioral: Negative.     Objective:    Physical Exam Vitals and nursing note reviewed.  Constitutional:      Appearance: Normal appearance.  HENT:     Head: Normocephalic and atraumatic.     Nose: Nose normal.     Mouth/Throat:     Mouth: Mucous membranes are moist.     Pharynx: Oropharynx is clear.  Cardiovascular:     Rate and Rhythm: Normal rate and regular rhythm.     Pulses: Normal pulses.     Heart sounds: Normal heart sounds.  Pulmonary:     Effort: Pulmonary effort is normal.     Breath sounds: Normal breath sounds.  Abdominal:     General: Bowel sounds are normal.     Palpations: Abdomen is soft.  Musculoskeletal:        General: Normal range of motion.     Cervical back: Normal range of motion and neck supple.  Skin:    General: Skin is warm and dry.  Neurological:     General: No focal deficit present.     Mental Status: She is alert and oriented to person, place, and time.  Psychiatric:        Mood and Affect: Mood normal.        Behavior: Behavior normal.        Thought Content: Thought content normal.        Judgment: Judgment normal.      BP 128/84   Pulse 65   Ht 5\' 3"  (1.6 m)   Wt 193 lb (87.5 kg)   SpO2 100%   BMI 34.19 kg/m  Wt Readings from Last 3 Encounters:  05/14/20 193 lb (87.5 kg)  12/11/19 194 lb 9.6 oz (88.3 kg)  06/04/19 193 lb 9.6 oz (87.8  kg)   Health Maintenance Due  Topic Date Due  . Hepatitis C Screening  Never done  . COLONOSCOPY (Pts 45-29yrs Insurance coverage will need to be confirmed)  Never done  . COVID-19 Vaccine (3 - Booster for Moderna series) 11/21/2019  . PAP SMEAR-Modifier  03/11/2020    There are no preventive care reminders to display for this patient.  Lab Results  Component Value Date   TSH 71.200 (H) 05/14/2020   Lab Results  Component Value Date   WBC 2.3 (LL) 05/14/2020   HGB 8.4 (L) 05/14/2020   HCT 26.3 (L) 05/14/2020   MCV 83 05/14/2020   PLT 312 05/14/2020   Lab Results  Component Value Date   NA 141 05/14/2020   K 4.3 05/14/2020   CO2 23 05/14/2020   GLUCOSE 81 05/14/2020   BUN 18 05/14/2020   CREATININE 0.89 05/14/2020   BILITOT 0.4 05/14/2020   ALKPHOS 54 05/14/2020   AST 28 05/14/2020   ALT 22 05/14/2020   PROT 7.8 05/14/2020   ALBUMIN 4.4 05/14/2020   CALCIUM 9.4 05/14/2020   ANIONGAP 8 04/22/2017   GFR 119.38 03/19/2016   Lab Results  Component Value Date   CHOL 265 (H) 05/14/2020   Lab Results  Component Value Date   HDL 68 05/14/2020   Lab Results  Component Value Date   LDLCALC 188 (H) 05/14/2020   Lab Results  Component Value Date   TRIG 60 05/14/2020   Lab Results  Component Value Date   CHOLHDL 3.9 05/14/2020   Lab Results  Component Value Date   HGBA1C 5.2 06/04/2019      Assessment & Plan:   1. Hypothyroidism following radioiodine therapy - TSH  2. Hoarseness of voice  3. Swollen neck We will assess Thyroid levels.   4. Pap smear for cervical cancer screening - Ambulatory referral to Gynecology  5. Medication refill  6. Healthcare maintenance - TSH - T3, Free - T4, Free - Vitamin B12 -  Vitamin D, 25-hydroxy - Comprehensive metabolic panel - CBC with Differential - Lipid Panel  7. Follow up She will follow up in 3 months.   No orders of the defined types were placed in this encounter.    Orders Placed This Encounter  Procedures  . TSH  . T3, Free  . T4, Free  . Vitamin B12  . Vitamin D, 25-hydroxy  . Comprehensive metabolic panel  . CBC with Differential  . Lipid Panel  . Ambulatory referral to Gynecology     Referral Orders     Ambulatory referral to Gynecology   Raliegh Ip, MSN, ANE, FNP-BC [facility] [address] [place], [place] [address] [phone] fax  Problem List Items Addressed This Visit      Endocrine   Hypothyroidism following radioiodine therapy - Primary   Relevant Orders   TSH (Completed)    Other Visit Diagnoses    Hoarseness of voice       Swollen neck       Pap smear for cervical cancer screening       Relevant Orders   Ambulatory referral to Gynecology   Medication refill       Healthcare maintenance       Relevant Orders   TSH (Completed)   T3, Free (Completed)   T4, Free (Completed)   Vitamin B12 (Completed)   Vitamin D, 25-hydroxy (Completed)   Comprehensive metabolic panel (Completed)  CBC with Differential (Completed)   Lipid Panel (Completed)   Follow up          No orders of the defined types were placed in this encounter.   Follow-up: No follow-ups on file.    Kallie LocksNatalie M San Lohmeyer, FNP

## 2020-05-15 LAB — LIPID PANEL
Chol/HDL Ratio: 3.9 ratio (ref 0.0–4.4)
Cholesterol, Total: 265 mg/dL — ABNORMAL HIGH (ref 100–199)
HDL: 68 mg/dL (ref >39)
LDL Chol Calc (NIH): 188 mg/dL — ABNORMAL HIGH (ref 0–99)
Triglycerides: 60 mg/dL (ref 0–149)
VLDL Cholesterol Cal: 9 mg/dL (ref 5–40)

## 2020-05-15 LAB — CBC WITH DIFFERENTIAL/PLATELET
Basophils Absolute: 0 10*3/uL (ref 0.0–0.2)
Basos: 0 %
EOS (ABSOLUTE): 0 10*3/uL (ref 0.0–0.4)
Eos: 1 %
Hematocrit: 26.3 % — ABNORMAL LOW (ref 34.0–46.6)
Hemoglobin: 8.4 g/dL — ABNORMAL LOW (ref 11.1–15.9)
Immature Grans (Abs): 0 10*3/uL (ref 0.0–0.1)
Immature Granulocytes: 0 %
Lymphocytes Absolute: 1.2 10*3/uL (ref 0.7–3.1)
Lymphs: 54 %
MCH: 26.6 pg (ref 26.6–33.0)
MCHC: 31.9 g/dL (ref 31.5–35.7)
MCV: 83 fL (ref 79–97)
Monocytes Absolute: 0.2 10*3/uL (ref 0.1–0.9)
Monocytes: 10 %
Neutrophils Absolute: 0.8 10*3/uL — ABNORMAL LOW (ref 1.4–7.0)
Neutrophils: 35 %
Platelets: 312 10*3/uL (ref 150–450)
RBC: 3.16 x10E6/uL — ABNORMAL LOW (ref 3.77–5.28)
RDW: 16.9 % — ABNORMAL HIGH (ref 11.7–15.4)
WBC: 2.3 10*3/uL — CL (ref 3.4–10.8)

## 2020-05-15 LAB — COMPREHENSIVE METABOLIC PANEL
ALT: 22 IU/L (ref 0–32)
AST: 28 IU/L (ref 0–40)
Albumin/Globulin Ratio: 1.3 (ref 1.2–2.2)
Albumin: 4.4 g/dL (ref 3.8–4.8)
Alkaline Phosphatase: 54 IU/L (ref 44–121)
BUN/Creatinine Ratio: 20 (ref 9–23)
BUN: 18 mg/dL (ref 6–24)
Bilirubin Total: 0.4 mg/dL (ref 0.0–1.2)
CO2: 23 mmol/L (ref 20–29)
Calcium: 9.4 mg/dL (ref 8.7–10.2)
Chloride: 104 mmol/L (ref 96–106)
Creatinine, Ser: 0.89 mg/dL (ref 0.57–1.00)
Globulin, Total: 3.4 g/dL (ref 1.5–4.5)
Glucose: 81 mg/dL (ref 65–99)
Potassium: 4.3 mmol/L (ref 3.5–5.2)
Sodium: 141 mmol/L (ref 134–144)
Total Protein: 7.8 g/dL (ref 6.0–8.5)
eGFR: 80 mL/min/{1.73_m2} (ref >59)

## 2020-05-15 LAB — VITAMIN B12: Vitamin B-12: 426 pg/mL (ref 232–1245)

## 2020-05-15 LAB — VITAMIN D 25 HYDROXY (VIT D DEFICIENCY, FRACTURES): Vit D, 25-Hydroxy: 10 ng/mL — ABNORMAL LOW (ref 30.0–100.0)

## 2020-05-15 LAB — TSH: TSH: 71.2 u[IU]/mL — ABNORMAL HIGH (ref 0.450–4.500)

## 2020-05-15 LAB — T4, FREE: Free T4: 0.22 ng/dL — ABNORMAL LOW (ref 0.82–1.77)

## 2020-05-15 LAB — T3, FREE: T3, Free: 0.6 pg/mL — ABNORMAL LOW (ref 2.0–4.4)

## 2020-05-16 ENCOUNTER — Encounter: Payer: Self-pay | Admitting: Family Medicine

## 2020-05-16 ENCOUNTER — Other Ambulatory Visit: Payer: Self-pay

## 2020-05-16 ENCOUNTER — Other Ambulatory Visit: Payer: Medicaid Other

## 2020-05-16 ENCOUNTER — Other Ambulatory Visit: Payer: Self-pay | Admitting: Family Medicine

## 2020-05-16 DIAGNOSIS — D649 Anemia, unspecified: Secondary | ICD-10-CM

## 2020-05-16 DIAGNOSIS — N946 Dysmenorrhea, unspecified: Secondary | ICD-10-CM

## 2020-05-16 DIAGNOSIS — N920 Excessive and frequent menstruation with regular cycle: Secondary | ICD-10-CM

## 2020-05-16 DIAGNOSIS — E559 Vitamin D deficiency, unspecified: Secondary | ICD-10-CM

## 2020-05-16 MED ORDER — VITAMIN D (ERGOCALCIFEROL) 1.25 MG (50000 UNIT) PO CAPS: 50000.0000 [IU] | ORAL_CAPSULE | ORAL | 6 refills | Status: DC

## 2020-05-17 LAB — IRON AND TIBC
Iron Saturation: 6 % — CL (ref 15–55)
Iron: 22 ug/dL — ABNORMAL LOW (ref 27–159)
Total Iron Binding Capacity: 381 ug/dL (ref 250–450)
UIBC: 359 ug/dL (ref 131–425)

## 2020-05-20 ENCOUNTER — Encounter: Payer: Self-pay | Admitting: Family Medicine

## 2020-06-09 ENCOUNTER — Other Ambulatory Visit: Payer: Self-pay

## 2020-06-09 ENCOUNTER — Telehealth: Payer: Self-pay

## 2020-06-09 ENCOUNTER — Other Ambulatory Visit: Payer: Self-pay | Admitting: Nurse Practitioner

## 2020-06-09 DIAGNOSIS — D649 Anemia, unspecified: Secondary | ICD-10-CM

## 2020-06-09 DIAGNOSIS — Z760 Encounter for issue of repeat prescription: Secondary | ICD-10-CM

## 2020-06-09 DIAGNOSIS — E890 Postprocedural hypothyroidism: Secondary | ICD-10-CM

## 2020-06-09 DIAGNOSIS — E559 Vitamin D deficiency, unspecified: Secondary | ICD-10-CM

## 2020-06-09 MED ORDER — VITAMIN D (ERGOCALCIFEROL) 1.25 MG (50000 UNIT) PO CAPS: 50000.0000 [IU] | ORAL_CAPSULE | ORAL | 6 refills | Status: DC

## 2020-06-09 MED ORDER — LEVOTHYROXINE SODIUM 50 MCG PO TABS: 50.0000 ug | ORAL_TABLET | Freq: Every day | ORAL | 3 refills | Status: DC

## 2020-06-09 MED ORDER — LEVOTHYROXINE SODIUM 88 MCG PO TABS: 88.0000 ug | ORAL_TABLET | Freq: Every day | ORAL | 3 refills | Status: DC

## 2020-06-09 NOTE — Progress Notes (Signed)
   [facility] [address] [place]astasia Pall [place], [place]  [address] Phone:  [phone]   Fax:  [phone]  Assessment  Primary Diagnosis & Pertinent Problem List: The encounter diagnosis was Anemia, unspecified type.  Visit Diagnosis: 1. Anemia, unspecified type  Reevaluation since labs are 71 weeks old.

## 2020-06-09 NOTE — Telephone Encounter (Signed)
She is to come in and have labs to collected because is difficult to treat labs from 1 month ago I will place new orders

## 2020-06-09 NOTE — Telephone Encounter (Signed)
Pt was asking about medication for thyroid and needs to speak with.

## 2020-06-09 NOTE — Telephone Encounter (Signed)
Called and spoke with patient made her an appt to come to have iron levels check on 06/10/20 tomorrow .

## 2020-06-09 NOTE — Telephone Encounter (Signed)
Called and spoke with patient she didn't get refills on her medications, confirmed pharmacy sent in refills to pharmacy for thyroid and vit d , pt said that her she was told that iron level was low and was told that she injection or something done regarding. Pt her lab results it said that she was suppose to have an iron study. Not sure if should have be more labs or an infusion , please advise.

## 2020-06-10 ENCOUNTER — Other Ambulatory Visit: Payer: Medicaid Other

## 2020-06-10 ENCOUNTER — Other Ambulatory Visit: Payer: Self-pay | Admitting: Family Medicine

## 2020-06-10 ENCOUNTER — Other Ambulatory Visit: Payer: Self-pay

## 2020-06-10 DIAGNOSIS — D508 Other iron deficiency anemias: Secondary | ICD-10-CM

## 2020-06-10 DIAGNOSIS — E890 Postprocedural hypothyroidism: Secondary | ICD-10-CM

## 2020-06-11 ENCOUNTER — Other Ambulatory Visit: Payer: Self-pay | Admitting: Family Medicine

## 2020-06-11 LAB — ANEMIA PANEL
Ferritin: 22 ng/mL (ref 15–150)
Folate, Hemolysate: 330 ng/mL
Folate, RBC: 1187 ng/mL (ref >498)
Hematocrit: 27.8 % — ABNORMAL LOW (ref 34.0–46.6)
Iron Saturation: 41 % (ref 15–55)
Iron: 137 ug/dL (ref 27–159)
Retic Ct Pct: 1.4 % (ref 0.6–2.6)
Total Iron Binding Capacity: 332 ug/dL (ref 250–450)
UIBC: 195 ug/dL (ref 131–425)
Vitamin B-12: 425 pg/mL (ref 232–1245)

## 2020-06-11 LAB — THYROID PANEL WITH TSH
Free Thyroxine Index: 0.5 — ABNORMAL LOW (ref 1.2–4.9)
T3 Uptake Ratio: 16 % — ABNORMAL LOW (ref 24–39)
T4, Total: 3.3 ug/dL — ABNORMAL LOW (ref 4.5–12.0)
TSH: 62 u[IU]/mL — ABNORMAL HIGH (ref 0.450–4.500)

## 2020-06-11 NOTE — Progress Notes (Signed)
Lacey Mccarty is a 45 year old female with a medical history significant for iron deficiency anemia and hypothyroidism.  Patient's iron stores were markedly decreased 3 weeks prior.  Iron saturation is increased and TIBC is within normal range.  No iron infusion warranted at this time.  Patient advised to continue iron supplementation.  TSH is elevated at 62, patient advised to continue Synthroid 50 mcg daily.  We will recheck TSH in 4 weeks.   Nolon Nations  APRN, MSN, FNP-C [facility] [address] [place], [place] [address] [phone]

## 2020-06-17 ENCOUNTER — Ambulatory Visit: Payer: Self-pay | Admitting: Family Medicine

## 2020-07-16 ENCOUNTER — Encounter: Payer: Medicaid Other | Admitting: Family Medicine

## 2020-07-16 ENCOUNTER — Encounter: Payer: Self-pay | Admitting: Family Medicine

## 2020-07-16 NOTE — Progress Notes (Signed)
Patient did not keep appointment today. She may call to reschedule.  

## 2020-07-29 ENCOUNTER — Encounter: Payer: Self-pay | Admitting: Family Medicine

## 2020-07-29 ENCOUNTER — Ambulatory Visit (INDEPENDENT_AMBULATORY_CARE_PROVIDER_SITE_OTHER): Payer: Medicaid Other | Admitting: Family Medicine

## 2020-07-29 ENCOUNTER — Other Ambulatory Visit: Payer: Self-pay

## 2020-07-29 VITALS — BP 130/93 | HR 67 | Temp 97.3°F | Ht 63.0 in | Wt 189.0 lb

## 2020-07-29 DIAGNOSIS — D508 Other iron deficiency anemias: Secondary | ICD-10-CM

## 2020-07-29 DIAGNOSIS — E890 Postprocedural hypothyroidism: Secondary | ICD-10-CM

## 2020-07-29 NOTE — Progress Notes (Signed)
[facility]   Established Patient Office Visit  Subjective:  Patient ID: Lacey Mccarty, female    DOB: Nov 12, 1975  Age: 45 y.o. MRN: [id number]  CC:  Chief Complaint  Patient presents with   Follow-up    No questions or concerns.    HPI Lacey Mccarty is a 45 year old female with a medical history significant for mild intermittent asthma, chronic back pain, history of anemia of chronic disease, and hypothyroidism that presents for follow-up of chronic conditions.  Patient says that she has been doing well and has minimal complaints.  TSH has not been well controlled over the past several months.  She states that she takes medication 30 minutes prior to eating and has not skipped a dose recently.  Patient denies any constipation, weight gain, intolerance to heat/cold, and fatigue.  Patient endorses hair loss over the past several months.  Past Medical History:  Diagnosis Date   Anemia 05/2018   Asthma    Chronic back pain    Dysmenorrhea 05/2020   Graves disease    I-131 ablation late 07/2012   Hypothyroidism    Mediastinal mass    Menorrhagia with regular cycle 05/2020   Thymus hyperplasia ([place]) 03/2012    Past Surgical History:  Procedure Laterality Date   CESAREAN SECTION     LIPOSUCTION     NM THYROID STIM SUPRESS      Family History  Problem Relation Age of Onset   HIV Mother    Diabetes Mellitus II Mother    Hypertension Father    Cancer Father        Lung   Allergies Child    Allergies Child    Colon cancer Paternal Grandmother    Stomach cancer Paternal Grandmother    Breast cancer Maternal Aunt    Asthma Maternal Aunt     Social History   Socioeconomic History   Marital status: Single    Spouse name: Not on file   Number of children: Not on file   Years of education: Not on file   Highest education level: Not on file  Occupational History   Not on file  Tobacco Use   Smoking status: Former     Packs/day: 0.25    Years: 7.00    Pack years: 1.75    Types: Cigarettes    Quit date: 09/14/2013    Years since quitting: 6.8   Smokeless tobacco: Never  Vaping Use   Vaping Use: Never used  Substance and Sexual Activity   Alcohol use: Yes    Comment: occasional   Drug use: No   Sexual activity: Yes  Other Topics Concern   Not on file  Social History Narrative   ** Merged History Encounter **      Regular exercise: no   Caffeine use: none       Social Determinants of Radio broadcast assistant Strain: Not on file  Food Insecurity: Not on file  Transportation Needs: Not on file  Physical Activity: Not on file  Stress: Not on file  Social Connections: Not on file  Intimate Partner Violence: Not on file    Outpatient Medications Prior to Visit  Medication Sig Dispense Refill   levothyroxine (SYNTHROID) 50 MCG tablet Take 1 tablet (50 mcg total) by mouth daily. Take with Synthroid 88 mcg (138 mcg= Total Dosage), by mouth, daily. 90 tablet 3   levothyroxine (SYNTHROID) 88 MCG tablet Take 1 tablet (88  mcg total) by mouth daily. Take with Synthroid 50 mcg (138 mcg= Total Dosage), by mouth, daily. 90 tablet 3   Vitamin D, Ergocalciferol, (DRISDOL) 1.25 MG (50000 UNIT) CAPS capsule Take 1 capsule (50,000 Units total) by mouth every 7 (seven) days. 5 capsule 6   montelukast (SINGULAIR) 10 MG tablet Take 1 tablet (10 mg total) by mouth at bedtime. (Patient not taking: Reported on 07/29/2020) 30 tablet 2   No facility-administered medications prior to visit.    Allergies  Allergen Reactions   Tapazole [Methimazole] Hives and Shortness Of Breath    ROS Review of Systems  HENT:  Negative for postnasal drip.   Eyes: Negative.  Negative for pain and redness.  Respiratory: Negative.    Cardiovascular: Negative.   Gastrointestinal: Negative.   Endocrine: Negative for cold intolerance, heat intolerance, polydipsia, polyphagia and polyuria.  Genitourinary: Negative.    Musculoskeletal: Negative.  Negative for arthralgias and back pain.  Allergic/Immunologic: Negative.   Neurological: Negative.      Objective:    Physical Exam Constitutional:      Appearance: Normal appearance.  Cardiovascular:     Rate and Rhythm: Normal rate and regular rhythm.     Pulses: Normal pulses.     Heart sounds: Normal heart sounds.  Pulmonary:     Effort: Pulmonary effort is normal.  Abdominal:     General: Bowel sounds are normal.  Skin:    General: Skin is warm.  Neurological:     General: No focal deficit present.     Mental Status: She is alert. Mental status is at baseline.  Psychiatric:        Mood and Affect: Mood normal.        Behavior: Behavior normal.        Thought Content: Thought content normal.        Judgment: Judgment normal.    BP (!) 130/93   Pulse 67   Temp (!) 97.3 F (36.3 C)   Ht 5' 3"  (1.6 m)   Wt 189 lb 0.6 oz (85.7 kg)   LMP 07/22/2020   SpO2 100%   BMI 33.49 kg/m  Wt Readings from Last 3 Encounters:  07/29/20 189 lb 0.6 oz (85.7 kg)  05/14/20 193 lb (87.5 kg)  12/11/19 194 lb 9.6 oz (88.3 kg)     Health Maintenance Due  Topic Date Due   Hepatitis C Screening  Never done   COLONOSCOPY (Pts 45-3yr Insurance coverage will need to be confirmed)  Never done   COVID-19 Vaccine (3 - Booster for Moderna series) 10/22/2019   PAP SMEAR-Modifier  03/11/2020    There are no preventive care reminders to display for this patient.  Lab Results  Component Value Date   TSH 62.000 (H) 06/10/2020   Lab Results  Component Value Date   WBC 2.3 (LL) 05/14/2020   HGB 8.4 (L) 05/14/2020   HCT 27.8 (L) 06/10/2020   MCV 83 05/14/2020   PLT 312 05/14/2020   Lab Results  Component Value Date   NA 141 05/14/2020   K 4.3 05/14/2020   CO2 23 05/14/2020   GLUCOSE 81 05/14/2020   BUN 18 05/14/2020   CREATININE 0.89 05/14/2020   BILITOT 0.4 05/14/2020   ALKPHOS 54 05/14/2020   AST 28 05/14/2020   ALT 22 05/14/2020   PROT 7.8  05/14/2020   ALBUMIN 4.4 05/14/2020   CALCIUM 9.4 05/14/2020   ANIONGAP 8 04/22/2017   EGFR 80 05/14/2020   GFR 119.38 03/19/2016  Lab Results  Component Value Date   CHOL 265 (H) 05/14/2020   Lab Results  Component Value Date   HDL 68 05/14/2020   Lab Results  Component Value Date   LDLCALC 188 (H) 05/14/2020   Lab Results  Component Value Date   TRIG 60 05/14/2020   Lab Results  Component Value Date   CHOLHDL 3.9 05/14/2020   Lab Results  Component Value Date   HGBA1C 5.2 06/04/2019      Assessment & Plan:   Problem List Items Addressed This Visit       Endocrine   Hypothyroidism following radioiodine therapy - Primary   Relevant Orders   Thyroid Panel With TSH   Hypothyroidism following radioiodine therapy - Thyroid Panel With TSH  Other iron deficiency anemia Reviewed anemia panel from 06/10/2020, TIBC 332, iron 137, and iron saturation 41.  All within normal range.  Patient to continue iron supplementation  Follow-up: Return in about 3 months (around 10/29/2020). Donia Pounds  APRN, MSN, FNP-C [facility] [address] [place], [place] [address] [phone]

## 2020-07-30 ENCOUNTER — Other Ambulatory Visit: Payer: Self-pay | Admitting: Family Medicine

## 2020-07-30 ENCOUNTER — Telehealth: Payer: Self-pay | Admitting: Family Medicine

## 2020-07-30 ENCOUNTER — Other Ambulatory Visit: Payer: Self-pay

## 2020-07-30 ENCOUNTER — Telehealth: Payer: Self-pay

## 2020-07-30 DIAGNOSIS — Z760 Encounter for issue of repeat prescription: Secondary | ICD-10-CM

## 2020-07-30 DIAGNOSIS — E890 Postprocedural hypothyroidism: Secondary | ICD-10-CM

## 2020-07-30 LAB — THYROID PANEL WITH TSH
Free Thyroxine Index: 0.4 — ABNORMAL LOW (ref 1.2–4.9)
T3 Uptake Ratio: 15 % — ABNORMAL LOW (ref 24–39)
T4, Total: 2.5 ug/dL — ABNORMAL LOW (ref 4.5–12.0)
TSH: 63.5 u[IU]/mL — ABNORMAL HIGH (ref 0.450–4.500)

## 2020-07-30 MED ORDER — LEVOTHYROXINE SODIUM 50 MCG PO TABS
50.0000 ug | ORAL_TABLET | Freq: Every day | ORAL | 3 refills | Status: DC
  Filled 2020-07-30: qty 30, 30d supply, fill #0

## 2020-07-30 MED ORDER — LEVOTHYROXINE SODIUM 88 MCG PO TABS
88.0000 ug | ORAL_TABLET | Freq: Every day | ORAL | 3 refills | Status: DC
  Filled 2020-07-30: qty 30, 30d supply, fill #0

## 2020-07-30 NOTE — Telephone Encounter (Signed)
No orders of the defined types were placed in this encounter.

## 2020-07-30 NOTE — Progress Notes (Signed)
Meds ordered this encounter  Medications   levothyroxine (SYNTHROID) 88 MCG tablet    Sig: Take 1 tablet (88 mcg total) by mouth daily. Take with Synthroid 50 mcg (138 mcg= Total Dosage), by mouth, daily.    Dispense:  90 tablet    Refill:  3    Order Specific Question:   Supervising Provider    Answer:   Quentin Angst [[id number]]   levothyroxine (SYNTHROID) 50 MCG tablet    Sig: Take 1 tablet (50 mcg total) by mouth daily before breakfast. Take with Synthroid 88 mcg (138 mcg= Total Dosage), by mouth, daily.    Dispense:  90 tablet    Refill:  3    Order Specific Question:   Supervising Provider    Answer:   Quentin Angst [[id number]]       Nolon Nations  APRN, MSN, FNP-C [facility] [address] [place], [place] [address] [phone]

## 2020-07-30 NOTE — Telephone Encounter (Signed)
[address] call asking to speak with you about this pt  Lacey Mccarty (Engineer, drilling) [phone]  Update on a letter (from Raliegh Ip)

## 2020-08-06 ENCOUNTER — Other Ambulatory Visit: Payer: Self-pay

## 2020-08-13 ENCOUNTER — Ambulatory Visit: Payer: Self-pay | Admitting: Nurse Practitioner

## 2020-11-04 ENCOUNTER — Ambulatory Visit: Payer: Self-pay | Admitting: Family Medicine

## 2021-02-11 ENCOUNTER — Telehealth: Payer: Self-pay

## 2021-02-11 ENCOUNTER — Other Ambulatory Visit: Payer: Self-pay

## 2021-02-11 ENCOUNTER — Ambulatory Visit (INDEPENDENT_AMBULATORY_CARE_PROVIDER_SITE_OTHER): Payer: 59 | Admitting: Nurse Practitioner

## 2021-02-11 ENCOUNTER — Encounter: Payer: Self-pay | Admitting: Nurse Practitioner

## 2021-02-11 VITALS — BP 132/85 | HR 94 | Temp 98.0°F | Ht 63.0 in | Wt 204.0 lb

## 2021-02-11 DIAGNOSIS — R52 Pain, unspecified: Secondary | ICD-10-CM

## 2021-02-11 DIAGNOSIS — R490 Dysphonia: Secondary | ICD-10-CM

## 2021-02-11 DIAGNOSIS — E890 Postprocedural hypothyroidism: Secondary | ICD-10-CM

## 2021-02-11 NOTE — Patient Instructions (Signed)
You were seen today in the [facility] for evaluation of body aches and changes in voice . Labs were collected, results will be available via MyChart or, if abnormal, you will be contacted by clinic staff. You were prescribed medications, please take as directed. Please follow up in 6 wks for Pap smear and specialist referral.

## 2021-02-11 NOTE — Progress Notes (Signed)
[place], [place]  [address] Phone:  [phone]   Fax:  [phone] Subjective:   Patient ID: Lacey Mccarty, female    DOB: 12/07/1975, 46 y.o.   MRN: [id number]  Chief Complaint  Patient presents with   Follow-up    Pt is here today to discuss body aches, losing voice,dry cough x 1 month. Pt states that she has a knot on the top of her left foot x 2 weeks but no pain.   HPI Lacey Mccarty 46 y.o. 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 To the [facility] for general body aches, knot on left foot and changes in voice.  Patient states that she has non painful knot to left foot x 1 wk, that has now resolved. Denies any recent trauma or injury. Has had generalized body aches for several months and taking OTC tylenol and/motrin with only a temporary relief in symptoms. Currently rates pain 7/10 and describes as aching. Pain worsens with laying down or sitting.   Also concerned about changes in voice over the past several months, that has recently worsened. Patient states that she has had increased hoarseness over the past and non productive cough over the past 3 wks. Denies any pain or fever.   Endorses smoking cigarettes, 0.25 PPD x 3 yrs. Denies any substance usage. Denies any other complaints today. Currently compliant with all medications, including levothyroxine. When question about her initial diagnosis of hypothyroidism, states, "I was just diagnosed with a out of nowhere 2 yrs ago." Denies any history of cancer or thyroid complications. Denies any fatigue, chest pain, shortness of breath, HA or dizziness. Denies any blurred vision, numbness or tingling.   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    Past Surgical History:  Procedure Laterality Date   CESAREAN SECTION     LIPOSUCTION     NM THYROID STIM SUPRESS      Family History  Problem Relation Age of Onset   HIV Mother    Diabetes Mellitus II Mother    Hypertension Father    Cancer Father        Lung   Allergies Child    Allergies Child    Colon cancer Paternal Grandmother    Stomach cancer Paternal Grandmother    Breast cancer Maternal Aunt    Asthma Maternal Aunt     Social History   Socioeconomic History   Marital status: Single    Spouse name: Not on file   Number of children: Not on file   Years of education: Not on file   Highest education level: Not on file  Occupational History   Not on file  Tobacco Use   Smoking status: Some Days    Packs/day: 0.25    Years: 7.00    Pack years: 1.75    Types: Cigarettes    Last attempt to quit: 09/14/2013    Years since quitting: 7.4   Smokeless tobacco: Never  Vaping Use  Vaping Use: Never used  Substance and Sexual Activity   Alcohol use: Yes    Comment: occasional   Drug use: No   Sexual activity: Yes  Other Topics Concern   Not on file  Social History Narrative   ** Merged History Encounter **      Regular exercise: no   Caffeine use: none       Social Determinants of Radio broadcast assistant Strain: Not on file  Food Insecurity: Not on file  Transportation Needs: Not on file  Physical Activity: Not on file  Stress: Not on file  Social Connections: Not on file  Intimate Partner Violence: Not on file    Outpatient Medications Prior to Visit  Medication Sig Dispense Refill   Vitamin D, Ergocalciferol, (DRISDOL) 1.25 MG (50000 UNIT) CAPS capsule Take 1 capsule (50,000 Units total) by mouth every 7 (seven) days. 5 capsule 6   levothyroxine (SYNTHROID)  50 MCG tablet Take 1 tablet (50 mcg total) by mouth daily before breakfast. Take with Synthroid 88 mcg (138 mcg= Total Dosage), by mouth, daily. 90 tablet 3   levothyroxine (SYNTHROID) 88 MCG tablet Take 1 tablet (88 mcg total) by mouth daily. Take with Synthroid 50 mcg (138 mcg= Total Dosage), by mouth, daily. 90 tablet 3   montelukast (SINGULAIR) 10 MG tablet Take 1 tablet (10 mg total) by mouth at bedtime. (Patient not taking: Reported on 07/29/2020) 30 tablet 2   No facility-administered medications prior to visit.    Allergies  Allergen Reactions   Tapazole [Methimazole] Hives and Shortness Of Breath    Review of Systems  Constitutional:  Negative for chills, fever and malaise/fatigue.  HENT:         See HPI  Eyes: Negative.   Respiratory:  Positive for cough. Negative for hemoptysis, sputum production, shortness of breath and wheezing.   Cardiovascular:  Negative for chest pain, palpitations and leg swelling.  Gastrointestinal: Negative.  Negative for abdominal pain, blood in stool, constipation, diarrhea, nausea and vomiting.  Genitourinary: Negative.   Musculoskeletal:  Positive for myalgias. Negative for back pain, falls, joint pain and neck pain.  Skin: Negative.   Neurological: Negative.   Psychiatric/Behavioral:  Negative for depression. The patient is not nervous/anxious.   All other systems reviewed and are negative.     Objective:    Physical Exam Vitals reviewed.  Constitutional:      General: She is not in acute distress.    Appearance: Normal appearance. She is normal weight.  HENT:     Head: Normocephalic.     Right Ear: Tympanic membrane, ear canal and external ear normal. There is no impacted cerumen.     Left Ear: Tympanic membrane, ear canal and external ear normal. There is no impacted cerumen.     Nose: Nose normal. No congestion or rhinorrhea.     Mouth/Throat:     Mouth: Mucous membranes are moist.     Pharynx: Oropharynx is clear. No oropharyngeal  exudate or posterior oropharyngeal erythema.     Comments: Airway patent, with no difficulty managing secretions Eyes:     General: No scleral icterus.       Right eye: No discharge.        Left eye: No discharge.     Extraocular Movements: Extraocular movements intact.     Conjunctiva/sclera: Conjunctivae normal.     Pupils: Pupils are equal, round, and reactive to light.  Neck:     Thyroid: Thyromegaly present. No thyroid mass  or thyroid tenderness.  Cardiovascular:     Rate and Rhythm: Normal rate and regular rhythm.     Pulses: Normal pulses.     Heart sounds: Normal heart sounds.     Comments: No obvious peripheral edema Pulmonary:     Effort: Pulmonary effort is normal.     Breath sounds: Normal breath sounds.  Musculoskeletal:        General: No swelling, tenderness, deformity or signs of injury. Normal range of motion.     Cervical back: Full passive range of motion without pain, normal range of motion and neck supple.     Right lower leg: No edema.     Left lower leg: No edema.  Lymphadenopathy:     Cervical: No cervical adenopathy.  Skin:    General: Skin is warm and dry.     Capillary Refill: Capillary refill takes less than 2 seconds.  Neurological:     General: No focal deficit present.     Mental Status: She is alert and oriented to person, place, and time.  Psychiatric:        Mood and Affect: Mood normal.        Behavior: Behavior normal.        Thought Content: Thought content normal.        Judgment: Judgment normal.    BP 132/85    Pulse 94    Temp 98 F (36.7 C)    Ht 5' 3" (1.6 m)    Wt 204 lb (92.5 kg)    SpO2 100%    BMI 36.14 kg/m  Wt Readings from Last 3 Encounters:  02/11/21 204 lb (92.5 kg)  07/29/20 189 lb 0.6 oz (85.7 kg)  05/14/20 193 lb (87.5 kg)    Immunization History  Administered Date(s) Administered   Moderna SARS-COV2 Booster Vaccination 05/26/2020   Moderna Sars-Covid-2 Vaccination 04/19/2019, 05/22/2019   PPD Test 06/15/2016    Tdap 10/14/2013    Diabetic Foot Exam - Simple   No data filed     Lab Results  Component Value Date   TSH 88.500 (H) 02/11/2021   Lab Results  Component Value Date   WBC 2.5 (LL) 02/11/2021   HGB 9.3 (L) 02/11/2021   HCT 29.1 (L) 02/11/2021   MCV 88 02/11/2021   PLT 266 02/11/2021   Lab Results  Component Value Date   NA 140 02/11/2021   K 4.0 02/11/2021   CO2 20 02/11/2021   GLUCOSE 79 02/11/2021   BUN 17 02/11/2021   CREATININE 0.98 02/11/2021   BILITOT 0.3 02/11/2021   ALKPHOS 53 02/11/2021   AST 32 02/11/2021   ALT 21 02/11/2021   PROT 7.4 02/11/2021   ALBUMIN 4.6 02/11/2021   CALCIUM 8.9 02/11/2021   ANIONGAP 8 04/22/2017   EGFR 72 02/11/2021   GFR 119.38 03/19/2016   Lab Results  Component Value Date   CHOL 265 (H) 05/14/2020   CHOL 206 (H) 02/26/2019   CHOL 170 03/19/2016   Lab Results  Component Value Date   HDL 68 05/14/2020   HDL 67 02/26/2019   HDL 52.80 03/19/2016   Lab Results  Component Value Date   LDLCALC 188 (H) 05/14/2020   LDLCALC 120 (H) 02/26/2019   LDLCALC 104 (H) 03/19/2016   Lab Results  Component Value Date   TRIG 60 05/14/2020   TRIG 108 02/26/2019   TRIG 70.0 03/19/2016   Lab Results  Component Value Date   CHOLHDL 3.9 05/14/2020   CHOLHDL 3.1  02/26/2019   CHOLHDL 3 03/19/2016   Lab Results  Component Value Date   HGBA1C 5.2 06/04/2019   HGBA1C 5.4 08/30/2012       Assessment & Plan:   Problem List Items Addressed This Visit   Chart review indicates that patient patient was diagnosed with Graves Disease in 2014 and treated with radioiodine. She later developed hypothyroidism. TSH has been elevated since 2015. No thyroidectomy noted in chart and patient denies any past thyroid surgery.   Given patients recent increase in TSH, concerned for thyromegaly v other thyroid mass v malignancy     Endocrine   Hypothyroidism following radioiodine therapy - Primary   Relevant Orders   TSH+T4F+T3Free  (Completed) Referral completed to endocrinology   Other Visit Diagnoses     Hoarseness of voice       Relevant Orders   CT Soft Tissue Neck W Contrast   Body aches       Relevant Orders   CBC with Differential/Platelet (Completed)   Comprehensive metabolic panel (Completed) Informed to continue taking OTC medications as needed for symptoms   Follow up in 6 wks for Pap and reevaluation of symptoms/ treatment plan, sooner as needed     I am having Lacey Mccarty maintain her montelukast and Vitamin D (Ergocalciferol).  No orders of the defined types were placed in this encounter.    Teena Dunk, NP

## 2021-02-13 ENCOUNTER — Other Ambulatory Visit: Payer: Self-pay | Admitting: Nurse Practitioner

## 2021-02-13 DIAGNOSIS — Z760 Encounter for issue of repeat prescription: Secondary | ICD-10-CM

## 2021-02-13 DIAGNOSIS — E890 Postprocedural hypothyroidism: Secondary | ICD-10-CM

## 2021-02-13 DIAGNOSIS — D729 Disorder of white blood cells, unspecified: Secondary | ICD-10-CM

## 2021-02-13 MED ORDER — LEVOTHYROXINE SODIUM 88 MCG PO TABS: 88.0000 ug | ORAL_TABLET | Freq: Every day | ORAL | 3 refills | Status: DC

## 2021-02-13 MED ORDER — LEVOTHYROXINE SODIUM 50 MCG PO TABS: 50.0000 ug | ORAL_TABLET | Freq: Every day | ORAL | 3 refills | Status: DC

## 2021-02-14 LAB — CBC WITH DIFFERENTIAL/PLATELET
Basophils Absolute: 0 10*3/uL (ref 0.0–0.2)
Basos: 0 %
EOS (ABSOLUTE): 0 10*3/uL (ref 0.0–0.4)
Eos: 1 %
Hematocrit: 29.1 % — ABNORMAL LOW (ref 34.0–46.6)
Hemoglobin: 9.3 g/dL — ABNORMAL LOW (ref 11.1–15.9)
Immature Grans (Abs): 0 10*3/uL (ref 0.0–0.1)
Immature Granulocytes: 0 %
Lymphocytes Absolute: 1.3 10*3/uL (ref 0.7–3.1)
Lymphs: 50 %
MCH: 28 pg (ref 26.6–33.0)
MCHC: 32 g/dL (ref 31.5–35.7)
MCV: 88 fL (ref 79–97)
Monocytes Absolute: 0.2 10*3/uL (ref 0.1–0.9)
Monocytes: 7 %
Neutrophils Absolute: 1.1 10*3/uL — ABNORMAL LOW (ref 1.4–7.0)
Neutrophils: 42 %
Platelets: 266 10*3/uL (ref 150–450)
RBC: 3.32 x10E6/uL — ABNORMAL LOW (ref 3.77–5.28)
RDW: 15.6 % — ABNORMAL HIGH (ref 11.7–15.4)
WBC: 2.5 10*3/uL — CL (ref 3.4–10.8)

## 2021-02-14 LAB — COMPREHENSIVE METABOLIC PANEL
ALT: 21 IU/L (ref 0–32)
AST: 32 IU/L (ref 0–40)
Albumin/Globulin Ratio: 1.6 (ref 1.2–2.2)
Albumin: 4.6 g/dL (ref 3.8–4.8)
Alkaline Phosphatase: 53 IU/L (ref 44–121)
BUN/Creatinine Ratio: 17 (ref 9–23)
BUN: 17 mg/dL (ref 6–24)
Bilirubin Total: 0.3 mg/dL (ref 0.0–1.2)
CO2: 20 mmol/L (ref 20–29)
Calcium: 8.9 mg/dL (ref 8.7–10.2)
Chloride: 103 mmol/L (ref 96–106)
Creatinine, Ser: 0.98 mg/dL (ref 0.57–1.00)
Globulin, Total: 2.8 g/dL (ref 1.5–4.5)
Glucose: 79 mg/dL (ref 70–99)
Potassium: 4 mmol/L (ref 3.5–5.2)
Sodium: 140 mmol/L (ref 134–144)
Total Protein: 7.4 g/dL (ref 6.0–8.5)
eGFR: 72 mL/min/{1.73_m2} (ref >59)

## 2021-02-14 LAB — TSH+T4F+T3FREE
Free T4: 0.1 ng/dL — ABNORMAL LOW (ref 0.82–1.77)
T3, Free: <0.3 pg/mL — ABNORMAL LOW (ref 2.0–4.4)
TSH: 88.5 u[IU]/mL — ABNORMAL HIGH (ref 0.450–4.500)

## 2021-02-16 ENCOUNTER — Telehealth: Payer: Self-pay | Admitting: Hematology and Oncology

## 2021-02-16 NOTE — Telephone Encounter (Signed)
Scheduled appt per 1/6 referral. Pt is aware of appt date and time. Pt is aware to arrive 15 mins prior to appt time.  °

## 2021-02-24 ENCOUNTER — Other Ambulatory Visit: Payer: Self-pay

## 2021-02-24 ENCOUNTER — Inpatient Hospital Stay: Payer: 59 | Attending: Hematology and Oncology | Admitting: Hematology and Oncology

## 2021-02-24 ENCOUNTER — Encounter: Payer: Self-pay | Admitting: Hematology and Oncology

## 2021-02-24 DIAGNOSIS — E559 Vitamin D deficiency, unspecified: Secondary | ICD-10-CM | POA: Insufficient documentation

## 2021-02-24 DIAGNOSIS — E890 Postprocedural hypothyroidism: Secondary | ICD-10-CM | POA: Insufficient documentation

## 2021-02-24 DIAGNOSIS — Z803 Family history of malignant neoplasm of breast: Secondary | ICD-10-CM | POA: Insufficient documentation

## 2021-02-24 DIAGNOSIS — D500 Iron deficiency anemia secondary to blood loss (chronic): Secondary | ICD-10-CM | POA: Insufficient documentation

## 2021-02-24 DIAGNOSIS — R490 Dysphonia: Secondary | ICD-10-CM | POA: Insufficient documentation

## 2021-02-24 DIAGNOSIS — F17210 Nicotine dependence, cigarettes, uncomplicated: Secondary | ICD-10-CM | POA: Insufficient documentation

## 2021-02-24 DIAGNOSIS — E0500 Thyrotoxicosis with diffuse goiter without thyrotoxic crisis or storm: Secondary | ICD-10-CM | POA: Insufficient documentation

## 2021-02-24 DIAGNOSIS — Z720 Tobacco use: Secondary | ICD-10-CM

## 2021-02-24 DIAGNOSIS — Z801 Family history of malignant neoplasm of trachea, bronchus and lung: Secondary | ICD-10-CM | POA: Insufficient documentation

## 2021-02-24 DIAGNOSIS — Z800 Family history of malignant neoplasm of digestive organs: Secondary | ICD-10-CM | POA: Diagnosis not present

## 2021-02-24 DIAGNOSIS — N920 Excessive and frequent menstruation with regular cycle: Secondary | ICD-10-CM | POA: Diagnosis not present

## 2021-02-24 DIAGNOSIS — D72819 Decreased white blood cell count, unspecified: Secondary | ICD-10-CM | POA: Diagnosis not present

## 2021-02-24 NOTE — Assessment & Plan Note (Signed)
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

## 2021-02-24 NOTE — Assessment & Plan Note (Signed)
She has severe hypothyroidism since radioactive iodine treatment She is complaining of hoarseness that could be related to her thyroid dysfunction Due to her history of smoking, I recommend she pursue CT imaging of her neck as discussed with her primary care doctor I would defer to her primary care doctor for management of her acquired hypothyroidism We discussed importance of taking her medications consistently

## 2021-02-24 NOTE — Assessment & Plan Note (Signed)
She has history of iron deficiency anemia due to menorrhagia I will check iron studies I recommend daily multivitamin and iron supplement

## 2021-02-24 NOTE — Assessment & Plan Note (Signed)
We discussed the importance of tobacco cessation The patient appears motivated to quit on her own

## 2021-02-24 NOTE — Assessment & Plan Note (Signed)
She has diffuse joint pain due to severe vitamin D deficiency We discussed the importance of chronic vitamin D replacement therapy

## 2021-02-24 NOTE — Assessment & Plan Note (Signed)
Her menorrhagia is due to abnormal thyroid function We discussed the importance of getting her thyroid regulated

## 2021-02-24 NOTE — Progress Notes (Signed)
[facility] CONSULT NOTE  Patient Care Team: Dorena Dew, FNP as PCP - General (Family Medicine) Melrose Nakayama, MD as Attending Physician (Cardiothoracic Surgery)  CHIEF COMPLAINTS/PURPOSE OF CONSULTATION:  Chronic leukopenia and anemia, on the background history of autoimmune disorder with Graves' disease  HISTORY OF PRESENTING ILLNESS:  Lacey Mccarty 46 y.o. female is here because of abnormal CBC She is here accompanied by her spouse, Lacey Mccarty The patient has been battling debilitating autoimmune disorder/Graves' disease and had received radioactive iodine therapy I have reviewed her records dated back to 2010 She had normal baseline CBC then Starting around late 2014/2015, she was found to have abnormal CBC with leukopenia She also started to have heavy menstruation The patient was initially treated and managed by endocrinologist but due to loss of follow-up, she is currently following with her primary care doctor for management She has chronic joint pain and was found to have vitamin D deficiency but she has not been taking her vitamin D supplement The patient is somewhat depressed and frustrated due to lack of improvement She has excessive weight gain and poor energy Her dietary choices has been poor The patient denies any recent signs or symptoms of bleeding such as spontaneous epistaxis, hematuria or hematochezia. She has not been needing frequent antibiotics treatment despite chronic leukopenia  She had no prior history or diagnosis of cancer. Her age appropriate screening programs are up-to-date. The patient is a smoker and currently smokes 1/2 pack of cigarettes per day for the last last 15 years.  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    SURGICAL HISTORY: Past Surgical History:  Procedure Laterality Date   CESAREAN SECTION     LIPOSUCTION     NM THYROID STIM SUPRESS      SOCIAL HISTORY: Social History   Socioeconomic History   Marital status: Single    Spouse name: Not on file   Number of children: Not on file   Years of education: Not on file   Highest education level: Not on file  Occupational History   Not on file  Tobacco Use   Smoking status: Some Days    Packs/day: 0.25    Years: 7.00    Pack years: 1.75    Types: Cigarettes    Last attempt to quit: 09/14/2013    Years since quitting: 7.4   Smokeless tobacco: Never  Vaping Use   Vaping Use: Never used  Substance and Sexual Activity   Alcohol use: Yes    Comment: occasional   Drug use: No   Sexual activity: Yes  Other Topics Concern   Not on file  Social History Narrative   ** Merged History Encounter **      Regular exercise: no   Caffeine use: none       Social Determinants of Radio broadcast assistant Strain: Not on file  Food Insecurity: Not on file  Transportation Needs: Not on file  Physical Activity: Not on file  Stress: Not on file  Social Connections: Not on file  Intimate Partner Violence: Not on file    FAMILY HISTORY: Family History  Problem Relation Age of Onset  HIV Mother    Diabetes Mellitus II Mother    Hypertension Father    Cancer Father        Lung   Allergies Child    Allergies Child    Colon cancer Paternal Grandmother    Stomach cancer Paternal Grandmother    Breast cancer Maternal Aunt    Asthma Maternal Aunt     ALLERGIES:  is allergic to tapazole [methimazole].  MEDICATIONS:  Current Outpatient Medications  Medication Sig Dispense Refill   levothyroxine (SYNTHROID) 50 MCG tablet Take 1 tablet (50 mcg total) by mouth daily before breakfast. Take with Synthroid 88 mcg (138 mcg= Total Dosage), by mouth, daily. 90 tablet 3   levothyroxine  (SYNTHROID) 88 MCG tablet Take 1 tablet (88 mcg total) by mouth daily. Take with Synthroid 50 mcg (138 mcg= Total Dosage), by mouth, daily. 90 tablet 3   montelukast (SINGULAIR) 10 MG tablet Take 1 tablet (10 mg total) by mouth at bedtime. (Patient not taking: Reported on 07/29/2020) 30 tablet 2   Vitamin D, Ergocalciferol, (DRISDOL) 1.25 MG (50000 UNIT) CAPS capsule Take 1 capsule (50,000 Units total) by mouth every 7 (seven) days. 5 capsule 6   No current facility-administered medications for this visit.    REVIEW OF SYSTEMS:   Constitutional: Denies fevers, chills or abnormal night sweats Eyes: Denies blurriness of vision, double vision or watery eyes Ears, nose, mouth, throat, and face: Denies mucositis or sore throat Respiratory: Denies cough, dyspnea or wheezes Cardiovascular: Denies palpitation, chest discomfort or lower extremity swelling Gastrointestinal:  Denies nausea, heartburn or change in bowel habits Skin: Denies abnormal skin rashes Lymphatics: Denies new lymphadenopathy or easy bruising Neurological:Denies numbness, tingling or new weaknesses Behavioral/Psych: Mood is stable, no new changes  All other systems were reviewed with the patient and are negative.  PHYSICAL EXAMINATION: ECOG PERFORMANCE STATUS: 1 - Symptomatic but completely ambulatory  Vitals:   02/24/21 1350  BP: (!) 135/94  Pulse: 92  Resp: 18  Temp: 98.3 F (36.8 C)  SpO2: 99%   Filed Weights   02/24/21 1350  Weight: 203 lb 3.2 oz (92.2 kg)    GENERAL:alert, no distress and comfortable SKIN: skin color, texture, turgor are normal, no rashes or significant lesions EYES: normal, conjunctiva are pink and non-injected, sclera clear OROPHARYNX:no exudate, no erythema and lips, buccal mucosa, and tongue normal  NECK: No discrete thyroid nodule is found but diffuse fullness is noted on the anterior part of her neck LYMPH:  no palpable lymphadenopathy in the cervical, axillary or inguinal LUNGS: clear  to auscultation and percussion with normal breathing effort HEART: regular rate & rhythm and no murmurs and no lower extremity edema ABDOMEN:abdomen soft, non-tender and normal bowel sounds Musculoskeletal:no cyanosis of digits and no clubbing  PSYCH: alert & oriented x 3 with fluent speech NEURO: no focal motor/sensory deficits  LABORATORY DATA:  I have reviewed the data as listed Recent Results (from the past 2160 hour(s))  TSH+T4F+T3Free     Status: Abnormal   Collection Time: 02/11/21 11:18 AM  Result Value Ref Range   TSH 88.500 (H) 0.450 - 4.500 uIU/mL   T3, Free <0.3 (L) 2.0 - 4.4 pg/mL   Free T4 0.10 (L) 0.82 - 1.77 ng/dL    Comment: **Verified by repeat analysis**  CBC with Differential/Platelet     Status: Abnormal   Collection Time: 02/11/21 11:18 AM  Result Value Ref Range   WBC 2.5 (LL) 3.4 - 10.8 x10E3/uL   RBC 3.32 (L) 3.77 -  5.28 x10E6/uL   Hemoglobin 9.3 (L) 11.1 - 15.9 g/dL   Hematocrit 29.1 (L) 34.0 - 46.6 %   MCV 88 79 - 97 fL   MCH 28.0 26.6 - 33.0 pg   MCHC 32.0 31.5 - 35.7 g/dL   RDW 15.6 (H) 11.7 - 15.4 %   Platelets 266 150 - 450 x10E3/uL   Neutrophils 42 Not Estab. %   Lymphs 50 Not Estab. %   Monocytes 7 Not Estab. %   Eos 1 Not Estab. %   Basos 0 Not Estab. %   Neutrophils Absolute 1.1 (L) 1.4 - 7.0 x10E3/uL   Lymphocytes Absolute 1.3 0.7 - 3.1 x10E3/uL   Monocytes Absolute 0.2 0.1 - 0.9 x10E3/uL   EOS (ABSOLUTE) 0.0 0.0 - 0.4 x10E3/uL   Basophils Absolute 0.0 0.0 - 0.2 x10E3/uL   Immature Granulocytes 0 Not Estab. %   Immature Grans (Abs) 0.0 0.0 - 0.1 x10E3/uL  Comprehensive metabolic panel     Status: None   Collection Time: 02/11/21 11:18 AM  Result Value Ref Range   Glucose 79 70 - 99 mg/dL   BUN 17 6 - 24 mg/dL   Creatinine, Ser 0.98 0.57 - 1.00 mg/dL   eGFR 72 >59 mL/min/1.73   BUN/Creatinine Ratio 17 9 - 23   Sodium 140 134 - 144 mmol/L   Potassium 4.0 3.5 - 5.2 mmol/L   Chloride 103 96 - 106 mmol/L   CO2 20 20 - 29 mmol/L    Calcium 8.9 8.7 - 10.2 mg/dL   Total Protein 7.4 6.0 - 8.5 g/dL   Albumin 4.6 3.8 - 4.8 g/dL   Globulin, Total 2.8 1.5 - 4.5 g/dL   Albumin/Globulin Ratio 1.6 1.2 - 2.2   Bilirubin Total 0.3 0.0 - 1.2 mg/dL   Alkaline Phosphatase 53 44 - 121 IU/L   AST 32 0 - 40 IU/L   ALT 21 0 - 32 IU/L   ASSESSMENT & PLAN  Chronic 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 There is no specific treatment that I could prescribe to make her white count go higher She needs aggressive treatment of her autoimmune disorder and hypothyroidism and her white count should improve along with the She had vitamin B12 level drawn not long ago and that was within normal range She does not need prophylactic antibiotics of treatment I plan to repeat it again in a few months when she returns for follow-up  Hypothyroidism following radioiodine therapy She has severe hypothyroidism since radioactive iodine treatment She is complaining of hoarseness that could be related to her thyroid dysfunction Due to her history of smoking, I recommend she pursue CT imaging of her neck as discussed with her primary care doctor I would defer to her primary care doctor for management of her acquired hypothyroidism We discussed importance of taking her medications consistently  Iron deficiency anemia She has history of iron deficiency anemia due to menorrhagia I will check iron studies I recommend daily multivitamin and iron supplement  Menorrhagia with regular cycle Her menorrhagia is due to abnormal thyroid function We discussed the importance of getting her thyroid regulated   Vitamin D deficiency She has diffuse joint pain due to severe vitamin D deficiency We discussed the importance of chronic vitamin D replacement therapy  Tobacco abuse We discussed the importance of tobacco cessation The patient appears motivated to quit on her own  No orders of the  defined types were placed in  this encounter.  Orders Placed This Encounter  Procedures   CBC with Differential ([place] Only)    Standing Status:   Future    Standing Expiration Date:   02/24/2022   Ferritin    Standing Status:   Future    Standing Expiration Date:   02/24/2022   Iron and Iron Binding Capacity ([facility] only)    Standing Status:   Future    Standing Expiration Date:   02/24/2022   Reticulocytes    Standing Status:   Future    Standing Expiration Date:   02/24/2022   Sedimentation rate    Standing Status:   Future    Standing Expiration Date:   02/24/2022   VITAMIN D 25 Hydroxy (Vit-D Deficiency, Fractures)    Standing Status:   Future    Standing Expiration Date:   02/24/2022   Vitamin B12    Standing Status:   Future    Standing Expiration Date:   02/24/2022    All questions were answered. The patient knows to call the clinic with any problems, questions or concerns. I spent 60 minutes counseling the patient face to face, counseling and review of test results.     Heath Lark, MD 02/24/2021 2:45 PM

## 2021-03-02 ENCOUNTER — Other Ambulatory Visit: Payer: Self-pay | Admitting: Nurse Practitioner

## 2021-03-02 DIAGNOSIS — M2550 Pain in unspecified joint: Secondary | ICD-10-CM

## 2021-03-02 DIAGNOSIS — Z760 Encounter for issue of repeat prescription: Secondary | ICD-10-CM

## 2021-03-02 DIAGNOSIS — E890 Postprocedural hypothyroidism: Secondary | ICD-10-CM

## 2021-03-02 MED ORDER — LEVOTHYROXINE SODIUM 50 MCG PO TABS: 100.0000 ug | ORAL_TABLET | Freq: Every day | ORAL | 1 refills | Status: DC

## 2021-03-02 MED ORDER — MELOXICAM 15 MG PO TABS
15.0000 mg | ORAL_TABLET | Freq: Every day | ORAL | 0 refills | Status: DC
Start: 2021-03-02 — End: 2021-04-01

## 2021-03-06 ENCOUNTER — Other Ambulatory Visit: Payer: Self-pay | Admitting: Nurse Practitioner

## 2021-03-06 DIAGNOSIS — E890 Postprocedural hypothyroidism: Secondary | ICD-10-CM

## 2021-03-10 ENCOUNTER — Ambulatory Visit (HOSPITAL_COMMUNITY): Payer: 59

## 2021-03-12 ENCOUNTER — Ambulatory Visit (HOSPITAL_COMMUNITY): Admission: RE | Admit: 2021-03-12 | Payer: 59 | Source: Ambulatory Visit

## 2021-03-20 ENCOUNTER — Telehealth: Payer: Self-pay | Admitting: Family Medicine

## 2021-03-20 NOTE — Telephone Encounter (Signed)
Patient left VM on 2/6 requesting follow up on her endocrinology referral and states that her imaging referral requires approval.  Please call patient with next steps

## 2021-03-23 NOTE — Telephone Encounter (Signed)
The PA was done for the pt 2 weeks ago for her imaging I am not sure what the outcome of her prior authorization was because we haven't received anything yet and I assumed she had already had the imaging done. I will check on her endo referral tomorrow.

## 2021-03-24 ENCOUNTER — Emergency Department (HOSPITAL_BASED_OUTPATIENT_CLINIC_OR_DEPARTMENT_OTHER)
Admission: EM | Admit: 2021-03-24 | Discharge: 2021-03-24 | Disposition: A | Discharge status: Home / Self Care | Payer: 59 | Attending: Emergency Medicine | Admitting: Emergency Medicine

## 2021-03-24 ENCOUNTER — Emergency Department (HOSPITAL_BASED_OUTPATIENT_CLINIC_OR_DEPARTMENT_OTHER): Payer: 59

## 2021-03-24 ENCOUNTER — Other Ambulatory Visit: Payer: Self-pay

## 2021-03-24 ENCOUNTER — Encounter (HOSPITAL_BASED_OUTPATIENT_CLINIC_OR_DEPARTMENT_OTHER): Payer: Self-pay | Admitting: *Deleted

## 2021-03-24 DIAGNOSIS — S99911A Unspecified injury of right ankle, initial encounter: Secondary | ICD-10-CM

## 2021-03-24 DIAGNOSIS — Y9302 Activity, running: Secondary | ICD-10-CM | POA: Diagnosis not present

## 2021-03-24 DIAGNOSIS — W1842XA Slipping, tripping and stumbling without falling due to stepping into hole or opening, initial encounter: Secondary | ICD-10-CM | POA: Diagnosis not present

## 2021-03-24 DIAGNOSIS — Z79899 Other long term (current) drug therapy: Secondary | ICD-10-CM | POA: Diagnosis not present

## 2021-03-24 DIAGNOSIS — M25571 Pain in right ankle and joints of right foot: Secondary | ICD-10-CM | POA: Diagnosis present

## 2021-03-24 NOTE — ED Provider Notes (Signed)
[facility] EMERGENCY DEPARTMENT Provider Note   CSN: [id number] Arrival date & time: 03/24/21  5009     History  Chief Complaint  Patient presents with   Ankle Pain    Lacey Mccarty is a 46 y.o. female.  Patient with no pertinent past medical history presents today with complaints of right ankle pain. She states that same began yesterday when she was playing with her 51 year old grandchild. She states that she was chasing after her with slides on and rolled her ankle. She states that she has been having pain since with difficulty ambulating due to pain. Her pain is located mostly in the posterior of her right ankle.  The history is provided by the patient. No language interpreter was used.  Ankle Pain Associated symptoms: no fever       Home Medications Prior to Admission medications   Medication Sig Start Date End Date Taking? Authorizing Provider  levothyroxine (SYNTHROID) 50 MCG tablet Take 2 tablets (100 mcg total) by mouth daily before breakfast. Take with Synthroid 88 mcg (188 mcg= Total Dosage), by mouth, daily. 03/02/21 05/01/21  Orion Crook I, NP  levothyroxine (SYNTHROID) 88 MCG tablet Take 1 tablet (88 mcg total) by mouth daily. Take with Synthroid 50 mcg (138 mcg= Total Dosage), by mouth, daily. 02/13/21   Passmore, Enid Derry I, NP  meloxicam (MOBIC) 15 MG tablet Take 1 tablet (15 mg total) by mouth daily. 03/02/21 04/01/21  Passmore, Enid Derry I, NP  montelukast (SINGULAIR) 10 MG tablet Take 1 tablet (10 mg total) by mouth at bedtime. Patient not taking: Reported on 07/29/2020 01/28/19   Bing Neighbors, FNP  Vitamin D, Ergocalciferol, (DRISDOL) 1.25 MG (50000 UNIT) CAPS capsule Take 1 capsule (50,000 Units total) by mouth every 7 (seven) days. 06/09/20   Barbette Merino, NP      Allergies    Tapazole [methimazole]    Review of Systems   Review of Systems  Constitutional:  Negative for chills and fever.  Gastrointestinal:  Negative for nausea and vomiting.   Musculoskeletal:  Positive for arthralgias and myalgias.  All other systems reviewed and are negative.  Physical Exam Updated Vital Signs BP (!) 114/101 (BP Location: Right Arm)    Pulse 81    Temp 97.8 F (36.6 C) (Oral)    Resp 18    Ht 5\' 4"  (1.626 m)    Wt 90.7 kg    SpO2 100%    BMI 34.32 kg/m  Physical Exam Vitals and nursing note reviewed.  Constitutional:      General: She is not in acute distress.    Appearance: Normal appearance. She is obese. She is not ill-appearing, toxic-appearing or diaphoretic.     Comments: Patient resting comfortably in bed in no acute distress  HENT:     Head: Normocephalic and atraumatic.  Cardiovascular:     Rate and Rhythm: Normal rate.  Pulmonary:     Effort: Pulmonary effort is normal. No respiratory distress.  Musculoskeletal:     Cervical back: Normal range of motion.     Comments: Pain noted along the distal Achille's tendon on the right side. Negative Thompson's test. Pain with flexion and extension of the right foot. No obvious swelling, warmth, or deformity noted to the ankle or foot. She is able to move her toes without difficulty. Capillary refill less than 2 seconds. DP and PT pulses intact and 2+  Skin:    General: Skin is warm and dry.  Neurological:  General: No focal deficit present.     Mental Status: She is alert.  Psychiatric:        Mood and Affect: Mood normal.        Behavior: Behavior normal.    ED Results / Procedures / Treatments   Labs (all labs ordered are listed, but only abnormal results are displayed) Labs Reviewed - No data to display  EKG None  Radiology DG Ankle Complete Right  Result Date: 03/24/2021 CLINICAL DATA:  Right ankle injury EXAM: RIGHT ANKLE - COMPLETE 3+ VIEW COMPARISON:  None. FINDINGS: There is no evidence of acute fracture or dislocation. No significant arthritis. Soft tissues are unremarkable. IMPRESSION: No evidence of acute fracture. Electronically Signed   By: Caprice Renshaw M.D.    On: 03/24/2021 10:14    Procedures Procedures    Medications Ordered in ED Medications - No data to display  ED Course/ Medical Decision Making/ A&P                           Medical Decision Making Amount and/or Complexity of Data Reviewed Radiology: ordered.   Patient presents today after rolling her right ankle yesterday.  I, Lurena Nida, PA-C, personally reviewed and evaluated these image results supported by medical decision making  Patient's X-Ray negative for obvious fracture or dislocation. Pain managed in ED. Pt advised to follow up with orthopedics if symptoms persist for possibility of missed fracture diagnosis. Patient given walking boot while in ED, she is able to ambulate with this without difficulty. Conservative therapy recommended and discussed. Patient will be dc home & is agreeable with above plan. Discharged in stable condition.   Final Clinical Impression(s) / ED Diagnoses Final diagnoses:  Injury of right ankle, initial encounter    Rx / DC Orders ED Discharge Orders     None     An After Visit Summary was printed and given to the patient.     Vear Clock 03/24/21 1059    Maia Plan, MD 03/26/21 [phone]

## 2021-03-24 NOTE — ED Triage Notes (Signed)
Presents with rt ankle pain, states was playing with grandchild yesterday had on sandals and "rolled" rt ankle. Pain is primarily at posterior area, no obvious swelling noted at this time

## 2021-03-24 NOTE — Discharge Instructions (Signed)
As we discussed, your work-up in the ER today was unremarkable for acute abnormalities.  Your imaging of your ankle was negative for fractures or dislocations.  I suspect that you have an ankle sprain.  I have given you a walking boot for support of your ankle.  Please wear this while ambulating.  Additionally, I recommend ice, rest, and elevation of your ankle for faster healing.  You may take ibuprofen and Tylenol as needed for pain.  I have given you a referral to orthopedics for continued evaluation and management of your injury.  Please call them to schedule an appointment.  Return if development of any new or worsening symptoms.

## 2021-03-24 NOTE — ED Notes (Signed)
Presents with rt ankle pain after "rolling"  ankle while playing with grandchild. No obvious swelling is noted at this time, color is good, strong rt pedal pulse palpated. Comfort measures implemented with elevation and ice pack to poster ankle area where client states has the most discomfort.

## 2021-03-24 NOTE — ED Notes (Signed)
Pt very successful in ambulating with ortho boot in place, discussed elevation, rest and ice usage to aid in pain control. Informed of Ortho MD that she may follow up with if needed. Opportunity for questions provided prior to DC to home

## 2021-03-25 ENCOUNTER — Ambulatory Visit (INDEPENDENT_AMBULATORY_CARE_PROVIDER_SITE_OTHER): Payer: 59 | Admitting: Nurse Practitioner

## 2021-03-25 ENCOUNTER — Encounter: Payer: Self-pay | Admitting: Nurse Practitioner

## 2021-03-25 VITALS — BP 116/74 | HR 81 | Temp 97.3°F | Ht 63.0 in | Wt 194.6 lb

## 2021-03-25 DIAGNOSIS — E890 Postprocedural hypothyroidism: Secondary | ICD-10-CM | POA: Diagnosis not present

## 2021-03-25 DIAGNOSIS — M2550 Pain in unspecified joint: Secondary | ICD-10-CM | POA: Diagnosis not present

## 2021-03-25 DIAGNOSIS — G8929 Other chronic pain: Secondary | ICD-10-CM

## 2021-03-25 DIAGNOSIS — E669 Obesity, unspecified: Secondary | ICD-10-CM | POA: Diagnosis not present

## 2021-03-25 DIAGNOSIS — E559 Vitamin D deficiency, unspecified: Secondary | ICD-10-CM | POA: Diagnosis not present

## 2021-03-25 DIAGNOSIS — E66811 Obesity, class 1: Secondary | ICD-10-CM

## 2021-03-25 LAB — POCT GLYCOSYLATED HEMOGLOBIN (HGB A1C)
HbA1c POC (<> result, manual entry): 5.1 % (ref 4.0–5.6)
HbA1c, POC (controlled diabetic range): 5.1 % (ref 0.0–7.0)
HbA1c, POC (prediabetic range): 5.1 % — AB (ref 5.7–6.4)
Hemoglobin A1C: 5.1 % (ref 4.0–5.6)

## 2021-03-25 MED ORDER — VITAMIN D (ERGOCALCIFEROL) 1.25 MG (50000 UNIT) PO CAPS: 50000.0000 [IU] | ORAL_CAPSULE | ORAL | 6 refills | Status: DC

## 2021-03-25 MED ORDER — CYCLOBENZAPRINE HCL 10 MG PO TABS: 10.0000 mg | ORAL_TABLET | Freq: Three times a day (TID) | ORAL | 0 refills | Status: DC | PRN

## 2021-03-25 MED ORDER — ACETAMINOPHEN-CODEINE 300-30 MG PO TABS: 1.0000 | ORAL_TABLET | ORAL | 0 refills | Status: DC | PRN

## 2021-03-25 NOTE — Patient Instructions (Signed)
You were seen today in the [facility] for multiple joint pain and reevaluation of thyroid. Labs were collected, results will be available via MyChart or, if abnormal, you will be contacted by clinic staff. You were prescribed medications, please take as directed. Please follow up in 6 wks for reevaluation of thyroid and joint pain.

## 2021-03-25 NOTE — Progress Notes (Signed)
Lacey Mccarty, [place]  [address] Phone:  [phone]   Fax:  [phone] Subjective:   Patient ID: Lacey Mccarty, female    DOB: 14-Sep-1975, 46 y.o.   MRN: [id number]  Chief Complaint  Patient presents with   Follow-up    Pt is here today for her follow up visit. Pt state that since her last visit her body as been in severe pain and the medication prescribed is not helping.   HPI Lacey Mccarty 46 y.o. 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 To the [facility] for worsening body aches and reevaluation of hypothyroidism.  States that she has chronic pain in multiple joints that has recently worsened. Has been taking OTC and prescribed medications with no improvement in symptoms. Denis any worsening or improving factors. Currently rates pain 9/10 and describes as throbbing.   Completed follow up with oncology, still awaiting completion of CT. Denies any other complaints today. Denies any fever. Denies any fatigue, chest pain, shortness of breath, HA or dizziness. Denies any blurred vision, numbness or tingling.  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    Past Surgical History:  Procedure Laterality Date   CESAREAN SECTION     LIPOSUCTION     NM THYROID STIM SUPRESS      Family History  Problem Relation Age of Onset   HIV Mother    Diabetes Mellitus II Mother    Hypertension Father    Cancer Father        Lung   Allergies Child    Allergies Child    Colon cancer Paternal Grandmother    Stomach cancer  Paternal Grandmother    Breast cancer Maternal Aunt    Asthma Maternal Aunt     Social History   Socioeconomic History   Marital status: Single    Spouse name: Not on file   Number of children: Not on file   Years of education: Not on file   Highest education level: Not on file  Occupational History   Not on file  Tobacco Use   Smoking status: Some Days    Packs/day: 0.25    Years: 7.00    Pack years: 1.75    Types: Cigarettes    Last attempt to quit: 09/14/2013    Years since quitting: 7.5   Smokeless tobacco: Never  Vaping Use   Vaping Use: Never used  Substance and Sexual Activity   Alcohol use: Yes    Comment: occasional   Drug use: No   Sexual activity: Yes  Other Topics Concern   Not on file  Social History Narrative   ** Merged History Encounter **      Regular exercise: no   Caffeine use: none       Social Determinants of Radio broadcast assistant Strain: Not on file  Food Insecurity: Not on file  Transportation Needs: Not on file  Physical Activity: Not on file  Stress: Not on file  Social Connections: Not on file  Intimate Partner  Violence: Not on file    Outpatient Medications Prior to Visit  Medication Sig Dispense Refill   levothyroxine (SYNTHROID) 50 MCG tablet Take 2 tablets (100 mcg total) by mouth daily before breakfast. Take with Synthroid 88 mcg (188 mcg= Total Dosage), by mouth, daily. 60 tablet 1   levothyroxine (SYNTHROID) 88 MCG tablet Take 1 tablet (88 mcg total) by mouth daily. Take with Synthroid 50 mcg (138 mcg= Total Dosage), by mouth, daily. 90 tablet 3   montelukast (SINGULAIR) 10 MG tablet Take 1 tablet (10 mg total) by mouth at bedtime. 30 tablet 2   Vitamin D, Ergocalciferol, (DRISDOL) 1.25 MG (50000 UNIT) CAPS capsule Take 1 capsule (50,000 Units total) by mouth every 7 (seven) days. 5 capsule 6   meloxicam (MOBIC) 15 MG tablet Take 1 tablet (15 mg total) by mouth daily. (Patient not taking: Reported on 03/25/2021) 30 tablet 0    No facility-administered medications prior to visit.    Allergies  Allergen Reactions   Tapazole [Methimazole] Hives and Shortness Of Breath    Review of Systems  Constitutional:  Positive for malaise/fatigue. Negative for chills and fever.  Respiratory:  Negative for cough and shortness of breath.   Cardiovascular:  Negative for chest pain, palpitations and leg swelling.  Gastrointestinal:  Negative for abdominal pain, blood in stool, constipation, diarrhea, nausea and vomiting.  Musculoskeletal:  Positive for joint pain.  Skin: Negative.   Neurological: Negative.   Psychiatric/Behavioral:  Negative for depression. The patient is not nervous/anxious.   All other systems reviewed and are negative.     Objective:    Physical Exam Constitutional:      General: She is not in acute distress.    Appearance: Normal appearance. She is obese.  HENT:     Head: Normocephalic.  Cardiovascular:     Rate and Rhythm: Normal rate and regular rhythm.     Pulses: Normal pulses.     Heart sounds: Normal heart sounds.     Comments: No obvious peripheral edema Pulmonary:     Effort: Pulmonary effort is normal.     Breath sounds: Normal breath sounds.  Musculoskeletal:        General: Signs of injury present. No swelling, tenderness or deformity. Normal range of motion.     Right lower leg: No edema.     Left lower leg: No edema.     Comments: Immobilizing boot noted to right ankle, consistent with recent ankle sprain. Patient treated in ED yesterday  Skin:    General: Skin is warm and dry.     Capillary Refill: Capillary refill takes less than 2 seconds.  Neurological:     General: No focal deficit present.     Mental Status: She is alert and oriented to person, place, and time.  Psychiatric:        Mood and Affect: Mood normal.        Behavior: Behavior normal.        Thought Content: Thought content normal.        Judgment: Judgment normal.    BP 116/74    Pulse 81    Temp (!)  97.3 F (36.3 C)    Ht 5' 3"  (1.6 m)    Wt 194 lb 9.6 oz (88.3 kg)    LMP 03/24/2021    SpO2 98%    BMI 34.47 kg/m  Wt Readings from Last 3 Encounters:  03/25/21 194 lb 9.6 oz (88.3 kg)  03/24/21 199 lb 15.3 oz (90.7  kg)  02/24/21 203 lb 3.2 oz (92.2 kg)    Immunization History  Administered Date(s) Administered   Moderna SARS-COV2 Booster Vaccination 05/26/2020   Moderna Sars-Covid-2 Vaccination 04/19/2019, 05/22/2019   PPD Test 06/15/2016   Tdap 10/14/2013    Diabetic Foot Exam - Simple   No data filed     Lab Results  Component Value Date   TSH 88.500 (H) 02/11/2021   Lab Results  Component Value Date   WBC 2.5 (LL) 02/11/2021   HGB 9.3 (L) 02/11/2021   HCT 29.1 (L) 02/11/2021   MCV 88 02/11/2021   PLT 266 02/11/2021   Lab Results  Component Value Date   NA 140 02/11/2021   K 4.0 02/11/2021   CO2 20 02/11/2021   GLUCOSE 79 02/11/2021   BUN 17 02/11/2021   CREATININE 0.98 02/11/2021   BILITOT 0.3 02/11/2021   ALKPHOS 53 02/11/2021   AST 32 02/11/2021   ALT 21 02/11/2021   PROT 7.4 02/11/2021   ALBUMIN 4.6 02/11/2021   CALCIUM 8.9 02/11/2021   ANIONGAP 8 04/22/2017   EGFR 72 02/11/2021   GFR 119.38 03/19/2016   Lab Results  Component Value Date   CHOL 265 (H) 05/14/2020   CHOL 206 (H) 02/26/2019   CHOL 170 03/19/2016   Lab Results  Component Value Date   HDL 68 05/14/2020   HDL 67 02/26/2019   HDL 52.80 03/19/2016   Lab Results  Component Value Date   LDLCALC 188 (H) 05/14/2020   LDLCALC 120 (H) 02/26/2019   LDLCALC 104 (H) 03/19/2016   Lab Results  Component Value Date   TRIG 60 05/14/2020   TRIG 108 02/26/2019   TRIG 70.0 03/19/2016   Lab Results  Component Value Date   CHOLHDL 3.9 05/14/2020   CHOLHDL 3.1 02/26/2019   CHOLHDL 3 03/19/2016   Lab Results  Component Value Date   HGBA1C 5.2 06/04/2019   HGBA1C 5.4 08/30/2012       Assessment & Plan:   Problem List Items Addressed This Visit       Endocrine    Hypothyroidism following radioiodine therapy - Primary   Relevant Orders   TSH+T4F+T3Free Will adjust dosing/ treatment plan pending study results     Other   Vitamin D deficiency   Relevant Medications   Vitamin D, Ergocalciferol, (DRISDOL) 1.25 MG (50000 UNIT) CAPS capsule   Other Visit Diagnoses     Obesity (BMI 30.0-34.9)       Relevant Orders   POCT glycosylated hemoglobin (Hb A1C)   Chronic joint pain       Relevant Medications   Acetaminophen-Codeine (TYLENOL/CODEINE #3) 300-30 MG tablet   cyclobenzaprine (FLEXERIL) 10 MG tablet Discussed non pharmacological methods for management of symptoms   Other Relevant Orders   Ambulatory referral to Pain Clinic   Follow up in 6 wks for reevaluation of thyroid and pap smear, sooner as needed    I am having Carlyann S. Sirois start on Acetaminophen-Codeine and cyclobenzaprine. I am also having her maintain her montelukast, levothyroxine, meloxicam, levothyroxine, and Vitamin D (Ergocalciferol).  Meds ordered this encounter  Medications   Vitamin D, Ergocalciferol, (DRISDOL) 1.25 MG (50000 UNIT) CAPS capsule    Sig: Take 1 capsule (50,000 Units total) by mouth every 7 (seven) days.    Dispense:  5 capsule    Refill:  6   Acetaminophen-Codeine (TYLENOL/CODEINE #3) 300-30 MG tablet    Sig: Take 1 tablet by mouth every 4 (four) hours as needed for pain.  Dispense:  30 tablet    Refill:  0   cyclobenzaprine (FLEXERIL) 10 MG tablet    Sig: Take 1 tablet (10 mg total) by mouth 3 (three) times daily as needed for muscle spasms.    Dispense:  30 tablet    Refill:  0     Teena Dunk, NP

## 2021-03-26 LAB — TSH+T4F+T3FREE
Free T4: 1.82 ng/dL — ABNORMAL HIGH (ref 0.82–1.77)
T3, Free: 3.3 pg/mL (ref 2.0–4.4)
TSH: 0.32 u[IU]/mL — ABNORMAL LOW (ref 0.450–4.500)

## 2021-03-27 ENCOUNTER — Other Ambulatory Visit: Payer: Self-pay | Admitting: Nurse Practitioner

## 2021-03-27 DIAGNOSIS — Z760 Encounter for issue of repeat prescription: Secondary | ICD-10-CM

## 2021-03-27 DIAGNOSIS — E890 Postprocedural hypothyroidism: Secondary | ICD-10-CM

## 2021-03-27 MED ORDER — LEVOTHYROXINE SODIUM 75 MCG PO TABS: 75.0000 ug | ORAL_TABLET | Freq: Every day | ORAL | 1 refills | Status: DC

## 2021-03-31 ENCOUNTER — Encounter: Payer: Self-pay | Admitting: Physical Medicine & Rehabilitation

## 2021-03-31 ENCOUNTER — Other Ambulatory Visit: Payer: Self-pay | Admitting: Nurse Practitioner

## 2021-03-31 DIAGNOSIS — M2550 Pain in unspecified joint: Secondary | ICD-10-CM

## 2021-04-16 ENCOUNTER — Other Ambulatory Visit: Payer: Self-pay

## 2021-04-30 ENCOUNTER — Other Ambulatory Visit: Payer: Self-pay

## 2021-04-30 DIAGNOSIS — M2550 Pain in unspecified joint: Secondary | ICD-10-CM

## 2021-04-30 MED ORDER — MELOXICAM 15 MG PO TABS: 15.0000 mg | ORAL_TABLET | Freq: Every day | ORAL | 0 refills | Status: DC

## 2021-05-04 ENCOUNTER — Other Ambulatory Visit: Payer: Self-pay | Admitting: Nurse Practitioner

## 2021-05-04 DIAGNOSIS — M2550 Pain in unspecified joint: Secondary | ICD-10-CM

## 2021-05-04 MED ORDER — MELOXICAM 15 MG PO TABS: 15.0000 mg | ORAL_TABLET | Freq: Every day | ORAL | 0 refills | Status: DC

## 2021-05-06 ENCOUNTER — Ambulatory Visit (INDEPENDENT_AMBULATORY_CARE_PROVIDER_SITE_OTHER): Payer: 59 | Admitting: Nurse Practitioner

## 2021-05-06 ENCOUNTER — Encounter: Payer: Self-pay | Admitting: Nurse Practitioner

## 2021-05-06 ENCOUNTER — Other Ambulatory Visit: Payer: Self-pay

## 2021-05-06 VITALS — BP 127/87 | HR 86 | Temp 97.2°F | Ht 63.0 in | Wt 192.2 lb

## 2021-05-06 DIAGNOSIS — E890 Postprocedural hypothyroidism: Secondary | ICD-10-CM

## 2021-05-06 DIAGNOSIS — R221 Localized swelling, mass and lump, neck: Secondary | ICD-10-CM | POA: Diagnosis not present

## 2021-05-06 DIAGNOSIS — M2550 Pain in unspecified joint: Secondary | ICD-10-CM

## 2021-05-06 DIAGNOSIS — Z760 Encounter for issue of repeat prescription: Secondary | ICD-10-CM

## 2021-05-06 DIAGNOSIS — E559 Vitamin D deficiency, unspecified: Secondary | ICD-10-CM | POA: Diagnosis not present

## 2021-05-06 DIAGNOSIS — G8929 Other chronic pain: Secondary | ICD-10-CM

## 2021-05-06 MED ORDER — VITAMIN D (ERGOCALCIFEROL) 1.25 MG (50000 UNIT) PO CAPS: 50000.0000 [IU] | ORAL_CAPSULE | ORAL | 6 refills | Status: DC

## 2021-05-06 MED ORDER — ACETAMINOPHEN-CODEINE 300-30 MG PO TABS: 1.0000 | ORAL_TABLET | ORAL | 0 refills | Status: DC | PRN

## 2021-05-06 NOTE — Patient Instructions (Signed)
You were seen today in the [facility] for reevaluation of hypothyroidism. Labs were collected, results will be available via MyChart or, if abnormal, you will be contacted by clinic staff. You were prescribed medications, please take as directed. Please follow up in 6 wks  for lab only appointment  ?

## 2021-05-06 NOTE — Progress Notes (Signed)
? ?[place] ?[place], [place]  [address] ?Phone:  [phone]   Fax:  [phone] ?Subjective:  ? Patient ID: Lacey Mccarty, female    DOB: Jan 17, 1976, 46 y.o.   MRN: [id number] ? ?Chief Complaint  ?Patient presents with  ? Follow-up  ?  Pt is here for 6 weeks follow up, pt stated she still has thyroid and body aches pt took the tylenol and muscle relaxer and it didn't help also pt is requesting a refill on tylenol, vitamin D   ? ?HPI ?Lacey Mccarty 46 y.o. 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 To the [facility] for follow up on hypothyroidism.  ? ?Patient states that she continues to have chronic generalized pain and progressive loss of voice. Has been taking tylenol and muscle relaxer with only mild to moderate improvement in symptoms. Requesting refill of tylenol and vitamin D.  Also requesting noted for future court date, due to symptoms related to illness. Denies any other concerns today.  ? ?Denies any fever. Denies any fatigue, chest pain, shortness of breath, HA or dizziness. Denies any blurred vision, numbness or tingling. ? ?Past Medical History:  ?Diagnosis Date  ? Anemia 05/2018  ? Asthma   ? Chronic back pain   ? Dysmenorrhea 05/2020  ? Graves disease   ? I-131 ablation late 07/2012  ? Hypothyroidism   ? Mediastinal mass   ? Menorrhagia with regular cycle 05/2020  ? 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 ? ?Past Surgical History:  ?Procedure Laterality Date  ? CESAREAN SECTION    ? LIPOSUCTION    ? NM THYROID STIM SUPRESS    ? ? ?Family History  ?Problem Relation Age of Onset  ? HIV Mother   ? Diabetes Mellitus II Mother   ? Hypertension Father   ? Cancer Father   ?     Lung  ? Allergies Child   ? Allergies Child   ? Colon cancer Paternal Grandmother   ? Stomach cancer  Paternal Grandmother   ? Breast cancer Maternal Aunt   ? Asthma Maternal Aunt   ? ? ?Social History  ? ?Socioeconomic History  ? Marital status: Single  ?  Spouse name: Not on file  ? Number of children: Not on file  ? Years of education: Not on file  ? Highest education level: Not on file  ?Occupational History  ? Not on file  ?Tobacco Use  ? Smoking status: Some Days  ?  Packs/day: 0.25  ?  Years: 7.00  ?  Pack years: 1.75  ?  Types: Cigarettes  ?  Last attempt to quit: 09/14/2013  ?  Years since quitting: 7.6  ? Smokeless tobacco: Never  ?Vaping Use  ? Vaping Use: Never used  ?Substance and Sexual Activity  ? Alcohol use: Yes  ?  Comment: occasional  ? Drug use: No  ? Sexual activity: Yes  ?Other Topics Concern  ? Not on file  ?Social History Narrative  ? ** Merged History Encounter **  ?   ? Regular exercise: no  ? Caffeine use: none  ?    ? ?Social Determinants of Health  ? ?Financial Resource Strain: Not on file  ?Food Insecurity: Not on file  ?Transportation Needs: Not on file  ?Physical Activity: Not on file  ?Stress: Not on file  ?  Social Connections: Not on file  ?Intimate Partner Violence: Not on file  ? ? ?Outpatient Medications Prior to Visit  ?Medication Sig Dispense Refill  ? cyclobenzaprine (FLEXERIL) 10 MG tablet Take 1 tablet (10 mg total) by mouth 3 (three) times daily as needed for muscle spasms. 30 tablet 0  ? meloxicam (MOBIC) 15 MG tablet Take 1 tablet (15 mg total) by mouth daily. 30 tablet 0  ? montelukast (SINGULAIR) 10 MG tablet Take 1 tablet (10 mg total) by mouth at bedtime. 30 tablet 2  ? Acetaminophen-Codeine (TYLENOL/CODEINE #3) 300-30 MG tablet Take 1 tablet by mouth every 4 (four) hours as needed for pain. 30 tablet 0  ? D3-50 1.25 MG (50000 UT) capsule Take 50,000 Units by mouth once a week.    ? levothyroxine (SYNTHROID) 50 MCG tablet Take 2 tablets (100 mcg total) by mouth daily before breakfast. Take with Synthroid 88 mcg (188 mcg= Total Dosage), by mouth, daily. 60 tablet 1  ?  levothyroxine (SYNTHROID) 75 MCG tablet Take 1 tablet (75 mcg total) by mouth daily. Take with Synthroid 50 mcg (175 mcg= Total Dosage), by mouth, daily. (Patient not taking: Reported on 05/06/2021) 30 tablet 1  ? Vitamin D, Ergocalciferol, (DRISDOL) 1.25 MG (50000 UNIT) CAPS capsule Take 1 capsule (50,000 Units total) by mouth every 7 (seven) days. 5 capsule 6  ? ?No facility-administered medications prior to visit.  ? ? ?Allergies  ?Allergen Reactions  ? Tapazole [Methimazole] Hives and Shortness Of Breath  ? ? ?Review of Systems  ?Constitutional:  Negative for chills, fever and malaise/fatigue.  ?     See HPI  ?HENT:    ?     See HPI  ?Eyes: Negative.   ?Respiratory:  Negative for cough and shortness of breath.   ?Cardiovascular:  Negative for chest pain, palpitations and leg swelling.  ?Gastrointestinal:  Negative for abdominal pain, blood in stool, constipation, diarrhea, nausea and vomiting.  ?Musculoskeletal:  Positive for joint pain and myalgias. Negative for back pain, falls and neck pain.  ?     See HPI  ?Skin: Negative.   ?Neurological: Negative.   ?Psychiatric/Behavioral:  Negative for depression. The patient is not nervous/anxious.   ?All other systems reviewed and are negative. ? ?   ?Objective:  ?  ?Physical Exam ?Constitutional:   ?   General: She is not in acute distress. ?   Appearance: Normal appearance. She is obese.  ?HENT:  ?   Head: Normocephalic.  ?Neck:  ?   Vascular: No carotid bruit.  ?Cardiovascular:  ?   Rate and Rhythm: Normal rate and regular rhythm.  ?   Pulses: Normal pulses.  ?   Heart sounds: Normal heart sounds.  ?   Comments: No obvious peripheral edema ?Pulmonary:  ?   Effort: Pulmonary effort is normal.  ?   Breath sounds: Normal breath sounds.  ?Musculoskeletal:     ?   General: No swelling, tenderness, deformity or signs of injury. Normal range of motion.  ?   Cervical back: Normal range of motion and neck supple. No rigidity or tenderness.  ?   Right lower leg: No edema.  ?    Left lower leg: No edema.  ?Lymphadenopathy:  ?   Cervical: No cervical adenopathy.  ?Skin: ?   General: Skin is warm and dry.  ?   Capillary Refill: Capillary refill takes less than 2 seconds.  ?Neurological:  ?   General: No focal deficit present.  ?   Mental  Status: She is alert and oriented to person, place, and time.  ?Psychiatric:     ?   Mood and Affect: Mood normal.     ?   Behavior: Behavior normal.     ?   Thought Content: Thought content normal.     ?   Judgment: Judgment normal.  ? ? ?BP 127/87 (BP Location: Right Arm, Patient Position: Sitting, Cuff Size: Normal)   Pulse 86   Temp (!) 97.2 ?F (36.2 ?C)   Ht 5' 3"  (1.6 m)   Wt 192 lb 4 oz (87.2 kg)   SpO2 100%   BMI 34.06 kg/m?  ?Wt Readings from Last 3 Encounters:  ?05/06/21 192 lb 4 oz (87.2 kg)  ?03/25/21 194 lb 9.6 oz (88.3 kg)  ?03/24/21 199 lb 15.3 oz (90.7 kg)  ? ? ?Immunization History  ?Administered Date(s) Administered  ? Moderna SARS-COV2 Booster Vaccination 05/26/2020  ? Moderna Sars-Covid-2 Vaccination 04/19/2019, 05/22/2019  ? PPD Test 06/15/2016  ? Tdap 10/14/2013  ? ? ?Diabetic Foot Exam - Simple   ?No data filed ?  ? ? ?Lab Results  ?Component Value Date  ? TSH 23.500 (H) 05/06/2021  ? ?Lab Results  ?Component Value Date  ? WBC 2.5 (LL) 02/11/2021  ? HGB 9.3 (L) 02/11/2021  ? HCT 29.1 (L) 02/11/2021  ? MCV 88 02/11/2021  ? PLT 266 02/11/2021  ? ?Lab Results  ?Component Value Date  ? NA 140 02/11/2021  ? K 4.0 02/11/2021  ? CO2 20 02/11/2021  ? GLUCOSE 79 02/11/2021  ? BUN 17 02/11/2021  ? CREATININE 0.98 02/11/2021  ? BILITOT 0.3 02/11/2021  ? ALKPHOS 53 02/11/2021  ? AST 32 02/11/2021  ? ALT 21 02/11/2021  ? PROT 7.4 02/11/2021  ? ALBUMIN 4.6 02/11/2021  ? CALCIUM 8.9 02/11/2021  ? ANIONGAP 8 04/22/2017  ? EGFR 72 02/11/2021  ? GFR 119.38 03/19/2016  ? ?Lab Results  ?Component Value Date  ? CHOL 265 (H) 05/14/2020  ? CHOL 206 (H) 02/26/2019  ? CHOL 170 03/19/2016  ? ?Lab Results  ?Component Value Date  ? HDL 68 05/14/2020  ? HDL  67 02/26/2019  ? HDL 52.80 03/19/2016  ? ?Lab Results  ?Component Value Date  ? LDLCALC 188 (H) 05/14/2020  ? [place] 120 (H) 02/26/2019  ? LDLCALC 104 (H) 03/19/2016  ? ?Lab Results  ?Component Value Date  ? TRI

## 2021-05-07 LAB — TSH+T4F+T3FREE
Free T4: 0.73 ng/dL — ABNORMAL LOW (ref 0.82–1.77)
T3, Free: 1.7 pg/mL — ABNORMAL LOW (ref 2.0–4.4)
TSH: 23.5 u[IU]/mL — ABNORMAL HIGH (ref 0.450–4.500)

## 2021-05-08 MED ORDER — LEVOTHYROXINE SODIUM 50 MCG PO TABS: 100.0000 ug | ORAL_TABLET | Freq: Every day | ORAL | 1 refills | Status: DC

## 2021-05-08 NOTE — Addendum Note (Signed)
Addended by: Lindley Magnus L on: 05/08/2021 10:49 PM ? ? Modules accepted: Orders ? ?

## 2021-05-12 ENCOUNTER — Other Ambulatory Visit: Payer: Self-pay | Admitting: Nurse Practitioner

## 2021-05-12 DIAGNOSIS — J387 Other diseases of larynx: Secondary | ICD-10-CM

## 2021-05-18 ENCOUNTER — Telehealth: Payer: Self-pay | Admitting: Nurse Practitioner

## 2021-05-18 ENCOUNTER — Emergency Department (HOSPITAL_BASED_OUTPATIENT_CLINIC_OR_DEPARTMENT_OTHER): Payer: 59

## 2021-05-18 ENCOUNTER — Other Ambulatory Visit: Payer: Self-pay

## 2021-05-18 ENCOUNTER — Emergency Department (HOSPITAL_BASED_OUTPATIENT_CLINIC_OR_DEPARTMENT_OTHER)
Admission: EM | Admit: 2021-05-18 | Discharge: 2021-05-18 | Disposition: A | Discharge status: Home / Self Care | Payer: 59 | Attending: Emergency Medicine | Admitting: Emergency Medicine

## 2021-05-18 ENCOUNTER — Encounter (HOSPITAL_BASED_OUTPATIENT_CLINIC_OR_DEPARTMENT_OTHER): Payer: Self-pay | Admitting: Emergency Medicine

## 2021-05-18 DIAGNOSIS — J45909 Unspecified asthma, uncomplicated: Secondary | ICD-10-CM | POA: Diagnosis not present

## 2021-05-18 DIAGNOSIS — Z20822 Contact with and (suspected) exposure to covid-19: Secondary | ICD-10-CM | POA: Insufficient documentation

## 2021-05-18 DIAGNOSIS — D649 Anemia, unspecified: Secondary | ICD-10-CM | POA: Diagnosis not present

## 2021-05-18 DIAGNOSIS — Z79899 Other long term (current) drug therapy: Secondary | ICD-10-CM | POA: Diagnosis not present

## 2021-05-18 DIAGNOSIS — J029 Acute pharyngitis, unspecified: Secondary | ICD-10-CM | POA: Diagnosis not present

## 2021-05-18 DIAGNOSIS — E039 Hypothyroidism, unspecified: Secondary | ICD-10-CM | POA: Insufficient documentation

## 2021-05-18 DIAGNOSIS — R0602 Shortness of breath: Secondary | ICD-10-CM | POA: Diagnosis present

## 2021-05-18 DIAGNOSIS — J069 Acute upper respiratory infection, unspecified: Secondary | ICD-10-CM

## 2021-05-18 LAB — CBC WITH DIFFERENTIAL/PLATELET
Abs Immature Granulocytes: 0.01 10*3/uL (ref 0.00–0.07)
Basophils Absolute: 0 10*3/uL (ref 0.0–0.1)
Basophils Relative: 0 %
Eosinophils Absolute: 0 10*3/uL (ref 0.0–0.5)
Eosinophils Relative: 1 %
HCT: 30.6 % — ABNORMAL LOW (ref 36.0–46.0)
Hemoglobin: 9.7 g/dL — ABNORMAL LOW (ref 12.0–15.0)
Immature Granulocytes: 0 %
Lymphocytes Relative: 28 %
Lymphs Abs: 1.1 10*3/uL (ref 0.7–4.0)
MCH: 26.7 pg (ref 26.0–34.0)
MCHC: 31.7 g/dL (ref 30.0–36.0)
MCV: 84.3 fL (ref 80.0–100.0)
Monocytes Absolute: 0.3 10*3/uL (ref 0.1–1.0)
Monocytes Relative: 9 %
Neutro Abs: 2.4 10*3/uL (ref 1.7–7.7)
Neutrophils Relative %: 62 %
Platelets: 313 10*3/uL (ref 150–400)
RBC: 3.63 MIL/uL — ABNORMAL LOW (ref 3.87–5.11)
RDW: 16.1 % — ABNORMAL HIGH (ref 11.5–15.5)
WBC: 3.8 10*3/uL — ABNORMAL LOW (ref 4.0–10.5)
nRBC: 0 % (ref 0.0–0.2)

## 2021-05-18 LAB — BASIC METABOLIC PANEL
Anion gap: 7 (ref 5–15)
BUN: 13 mg/dL (ref 6–20)
CO2: 26 mmol/L (ref 22–32)
Calcium: 8.9 mg/dL (ref 8.9–10.3)
Chloride: 105 mmol/L (ref 98–111)
Creatinine, Ser: 0.71 mg/dL (ref 0.44–1.00)
GFR, Estimated: >60 mL/min (ref >60)
Glucose, Bld: 80 mg/dL (ref 70–99)
Potassium: 3.7 mmol/L (ref 3.5–5.1)
Sodium: 138 mmol/L (ref 135–145)

## 2021-05-18 LAB — RESP PANEL BY RT-PCR (FLU A&B, COVID) ARPGX2
Influenza A by PCR: NEGATIVE
Influenza B by PCR: NEGATIVE
SARS Coronavirus 2 by RT PCR: NEGATIVE

## 2021-05-18 MED ORDER — ALBUTEROL SULFATE HFA 108 (90 BASE) MCG/ACT IN AERS: 1.0000 | INHALATION_SPRAY | Freq: Four times a day (QID) | RESPIRATORY_TRACT | 0 refills | Status: AC | PRN

## 2021-05-18 MED ORDER — IPRATROPIUM-ALBUTEROL 0.5-2.5 (3) MG/3ML IN SOLN
3.0000 mL | Freq: Once | RESPIRATORY_TRACT | Status: AC
  Administered 2021-05-18: 3 mL via RESPIRATORY_TRACT
  Filled 2021-05-18: qty 3

## 2021-05-18 MED ORDER — BENZONATATE 100 MG PO CAPS: 100.0000 mg | ORAL_CAPSULE | Freq: Three times a day (TID) | ORAL | 0 refills | Status: DC | PRN

## 2021-05-18 MED ORDER — ACETAMINOPHEN 500 MG PO TABS: 1000.0000 mg | ORAL_TABLET | Freq: Once | ORAL | Status: DC

## 2021-05-18 NOTE — ED Provider Notes (Signed)
?[facility] EMERGENCY DEPARTMENT ?Provider Note ? ? ?CSN: [id number] ?Arrival date & time: 05/18/21  [id number] ? ?  ? ?History ? ?Chief Complaint  ?Patient presents with  ? Shortness of Breath  ? ? ?Lacey Mccarty is a 46 y.o. female with pertinent history of hypothyroidism, with vitamin D deficiency, lesion of colitis, reported history of asthma.  Presents emergency department with a chief complaint of shortness of breath, cough, and sore throat.  Patient states that she has had the symptoms over the last 3 days.  Patient has been using her inhaler with no improvement in symptoms.  Cough started off as nonproductive however now is producing a minimal amount of thick white mucus.  Patient reports that she has family members sick with similar symptoms.  Patient does endorse pleuritic chest pain.  Patient endorses generalized myalgias at baseline. ? ?Patient denies any trismus, facial swelling, drooling, trouble swallowing, leg swelling or tenderness, palpitations, hemoptysis, abdominal pain, nausea, vomiting, diarrhea, leg swelling or tenderness. ? ? ?Shortness of Breath ?Associated symptoms: chest pain (pleuritic), cough and sore throat   ?Associated symptoms: no abdominal pain, no fever, no headaches, no neck pain, no rash and no vomiting   ? ?  ? ?Home Medications ?Prior to Admission medications   ?Medication Sig Start Date End Date Taking? Authorizing Provider  ?Acetaminophen-Codeine (TYLENOL/CODEINE #3) 300-30 MG tablet Take 1 tablet by mouth every 4 (four) hours as needed for pain. 05/06/21   Orion CrookPassmore, Tewana I, NP  ?cyclobenzaprine (FLEXERIL) 10 MG tablet Take 1 tablet (10 mg total) by mouth 3 (three) times daily as needed for muscle spasms. 03/25/21   Orion CrookPassmore, Tewana I, NP  ?D3-50 1.25 MG (50000 UT) capsule Take 50,000 Units by mouth once a week. 03/25/21   [provider]  ?levothyroxine (SYNTHROID) 50 MCG tablet Take 2 tablets (100 mcg total) by mouth daily. 05/08/21 07/07/21  Orion CrookPassmore, Tewana I,  NP  ?meloxicam (MOBIC) 15 MG tablet Take 1 tablet (15 mg total) by mouth daily. 05/04/21   Passmore, Enid Derryewana I, NP  ?montelukast (SINGULAIR) 10 MG tablet Take 1 tablet (10 mg total) by mouth at bedtime. 01/28/19   Bing NeighborsHarris, Kimberly S, FNP  ?Vitamin D, Ergocalciferol, (DRISDOL) 1.25 MG (50000 UNIT) CAPS capsule Take 1 capsule (50,000 Units total) by mouth every 7 (seven) days. 05/06/21   Orion CrookPassmore, Tewana I, NP  ?   ? ?Allergies    ?Tapazole [methimazole]   ? ?Review of Systems   ?Review of Systems  ?Constitutional:  Negative for chills and fever.  ?HENT:  Positive for sore throat and voice change. Negative for congestion, rhinorrhea and trouble swallowing.   ?Eyes:  Negative for visual disturbance.  ?Respiratory:  Positive for cough and shortness of breath.   ?Cardiovascular:  Positive for chest pain (pleuritic). Negative for palpitations and leg swelling.  ?Gastrointestinal:  Negative for abdominal pain, diarrhea, nausea and vomiting.  ?Musculoskeletal:  Negative for back pain and neck pain.  ?Skin:  Negative for color change and rash.  ?Neurological:  Negative for dizziness, syncope, light-headedness and headaches.  ?Psychiatric/Behavioral:  Negative for confusion.   ? ?Physical Exam ?Updated Vital Signs ?BP 132/82   Pulse 72   Temp 98.4 ?F (36.9 ?C) (Oral)   Resp 20   Ht 5\' 3"  (1.6 m)   Wt 87.1 kg   LMP 05/10/2021   SpO2 100%   BMI 34.01 kg/m?  ?Physical Exam ?Vitals and nursing note reviewed.  ?Constitutional:   ?   General: She is not  in acute distress. ?   Appearance: She is not ill-appearing, toxic-appearing or diaphoretic.  ?HENT:  ?   Head: Normocephalic.  ?   Jaw: No trismus or pain on movement.  ?   Salivary Glands: Right salivary gland is not diffusely enlarged or tender. Left salivary gland is not diffusely enlarged or tender.  ?   Mouth/Throat:  ?   Lips: Pink. No lesions.  ?   Mouth: Mucous membranes are moist. No angioedema.  ?   Tongue: No lesions. Tongue does not deviate from midline.  ?    Palate: No mass and lesions.  ?   Pharynx: Oropharynx is clear. Uvula midline. No pharyngeal swelling, oropharyngeal exudate, posterior oropharyngeal erythema or uvula swelling.  ?   Tonsils: No tonsillar exudate or tonsillar abscesses. 1+ on the right. 1+ on the left.  ?   Comments: Handles oral secretions without difficulty. ?Eyes:  ?   General: No scleral icterus.    ?   Right eye: No discharge.     ?   Left eye: No discharge.  ?Cardiovascular:  ?   Rate and Rhythm: Normal rate.  ?   Pulses:     ?     Radial pulses are 2+ on the right side and 2+ on the left side.  ?   Heart sounds: Normal heart sounds, S1 normal and S2 normal.  ?Pulmonary:  ?   Effort: Pulmonary effort is normal. No tachypnea, bradypnea, prolonged expiration or respiratory distress.  ?   Breath sounds: Normal breath sounds. No stridor.  ?   Comments: Speaks in full complete sentences without difficulty ?Abdominal:  ?   General: Abdomen is flat. There is no distension. There are no signs of injury.  ?   Palpations: Abdomen is soft.  ?   Tenderness: There is no abdominal tenderness. There is no guarding or rebound.  ?Musculoskeletal:  ?   Cervical back: Normal range of motion and neck supple.  ?   Right lower leg: No swelling, deformity, lacerations, tenderness or bony tenderness. No edema.  ?   Left lower leg: No swelling, deformity, lacerations, tenderness or bony tenderness. No edema.  ?Skin: ?   General: Skin is warm and dry.  ?Neurological:  ?   General: No focal deficit present.  ?   Mental Status: She is alert.  ?Psychiatric:     ?   Behavior: Behavior is cooperative.  ? ? ?ED Results / Procedures / Treatments   ?Labs ?(all labs ordered are listed, but only abnormal results are displayed) ?Labs Reviewed  ?CBC WITH DIFFERENTIAL/PLATELET - Abnormal; Notable for the following components:  ?    Result Value  ? WBC 3.8 (*)   ? RBC 3.63 (*)   ? Hemoglobin 9.7 (*)   ? HCT 30.6 (*)   ? RDW 16.1 (*)   ? All other components within normal limits   ?RESP PANEL BY RT-PCR (FLU A&B, COVID) [id number]  ?BASIC METABOLIC PANEL  ? ? ?EKG ?EKG Interpretation ? ?Date/Time:  Monday May 18 2021 10:53:23 EDT ?Ventricular Rate:  77 ?PR Interval:  204 ?QRS Duration: 73 ?QT Interval:  412 ?QTC Calculation: 467 ?R Axis:   43 ?Text Interpretation: Sinus rhythm Borderline prolonged PR interval Low voltage, precordial leads No significant change since last tracing Confirmed by Melene Plan [phone]) on 05/18/2021 11:01:29 AM ? ?Radiology ?DG Chest Portable 1 View ? ?Result Date: 05/18/2021 ?CLINICAL DATA:  Provided history: Cough, shortness of breath. Additional history provided: Shortness of  breath for 3 days. Generalized body aches. EXAM: PORTABLE CHEST 1 VIEW COMPARISON:  Prior chest radiographs 12/18/2015 and earlier. FINDINGS: Heart size within normal limits. No appreciable airspace consolidation. Mild linear atelectasis versus scarring within the left mid to lower lung field. No evidence of pleural effusion or pneumothorax. No acute bony abnormality identified. IMPRESSION: Mild linear atelectasis versus scarring within the left mid-to-lower lung field. Otherwise, no evidence of acute cardiopulmonary abnormality. Electronically Signed   By: Jackey Loge D.O.   On: 05/18/2021 10:44   ? ?Procedures ?Procedures  ? ? ?Medications Ordered in ED ?Medications - No data to display ? ?ED Course/ Medical Decision Making/ A&P ?  ?                        ?Medical Decision Making ?Amount and/or Complexity of Data Reviewed ?Labs: ordered. ?Radiology: ordered. ? ?Risk ?OTC drugs. ?Prescription drug management. ? ? ?Alert 46 year old female in no acute distress, nontoxic-appearing.  Presents emergency department with a chief complaint of shortness of breath, cough, and sore throat. ? ?Information obtained from patient.  Past medical records were reviewed including previous provider notes, labs, and imaging.  Patient has medical history as outlined in HPI which complicates her care. ? ?Patient  reports that family members are sick with similar symptoms.  On exam patient's lungs are clear to auscultation bilaterally.  Patient speaks in full complete sentences without difficulty.  No swelling, erythema, or exudat

## 2021-05-18 NOTE — ED Triage Notes (Signed)
Pt arrives pov, steady gait with c/o shob x 3 days. Reports using inhaler more often. Pt also c/o cough and congestion and generalized body aches. ?

## 2021-05-18 NOTE — ED Notes (Signed)
ED Provider at bedside. 

## 2021-05-18 NOTE — Telephone Encounter (Signed)
Error/disregard

## 2021-05-18 NOTE — Discharge Instructions (Addendum)
You came to the emergency department today to be evaluated for your shortness of breath and cough.  Your physical exam was reassuring.  Your chest x-ray showed no signs of pneumonia.  You were negative for COVID-19 and influenza.  Your symptoms are likely due to a viral upper respiratory infection.  I have given you prescription for Tessalon to help with your cough, please take this medication as prescribed. ? ?Your blood work today showed that you are not anemic.  Your hemoglobin level appears to be at its normal level for yourself.  Please follow-up with your primary care provider for further management of your anemia. ? ?Please return to the emergency department if you have any new or worsening of symptoms. ?

## 2021-05-20 ENCOUNTER — Encounter: Payer: Self-pay | Admitting: Nurse Practitioner

## 2021-05-20 ENCOUNTER — Ambulatory Visit (INDEPENDENT_AMBULATORY_CARE_PROVIDER_SITE_OTHER): Payer: 59 | Admitting: Nurse Practitioner

## 2021-05-20 VITALS — BP 136/63 | HR 88 | Temp 98.3°F | Ht 63.0 in | Wt 192.8 lb

## 2021-05-20 DIAGNOSIS — H6692 Otitis media, unspecified, left ear: Secondary | ICD-10-CM | POA: Insufficient documentation

## 2021-05-20 MED ORDER — PREDNISONE 10 MG PO TABS: ORAL_TABLET | ORAL | 0 refills | Status: DC

## 2021-05-20 MED ORDER — AMOXICILLIN-POT CLAVULANATE 875-125 MG PO TABS: 1.0000 | ORAL_TABLET | Freq: Two times a day (BID) | ORAL | 0 refills | Status: DC

## 2021-05-20 NOTE — Patient Instructions (Signed)
1. Left otitis media, unspecified otitis media type ? ?- amoxicillin-clavulanate (AUGMENTIN) 875-125 MG tablet; Take 1 tablet by mouth 2 (two) times daily.  Dispense: 20 tablet; Refill: 0 ?- predniSONE (DELTASONE) 10 MG tablet; Take 4 tabs for 2 days, then 3 tabs for 2 days, then 2 tabs for 2 days, then 1 tab for 2 days, then stop  Dispense: 20 tablet; Refill: 0 ? ?Follow up: ? ?Follow up as needed ? ? ? ?Otitis Media, Adult ?Otitis media is a condition in which the middle ear is red and swollen (inflamed) and full of fluid. The middle ear is the part of the ear that contains bones for hearing as well as air that helps send sounds to the brain. The condition usually goes away on its own. ?What are the causes? ?This condition is caused by a blockage in the eustachian tube. This tube connects the middle ear to the back of the nose. It normally allows air into the middle ear. The blockage is caused by fluid or swelling. Problems that can cause blockage include: ?A cold or infection that affects the nose, mouth, or throat. ?Allergies. ?An irritant, such as tobacco smoke. ?Adenoids that have become large. The adenoids are soft tissue located in the back of the throat, behind the nose and the roof of the mouth. ?Growth or swelling in the upper part of the throat, just behind the nose (nasopharynx). ?Damage to the ear caused by a change in pressure. This is called barotrauma. ?What increases the risk? ?You are more likely to develop this condition if you: ?Smoke or are exposed to tobacco smoke. ?Have an opening in the roof of your mouth (cleft palate). ?Have acid reflux. ?Have problems in your body's defense system (immune system). ?What are the signs or symptoms? ?Symptoms of this condition include: ?Ear pain. ?Fever. ?Problems with hearing. ?Being tired. ?Fluid leaking from the ear. ?Ringing in the ear. ?How is this treated? ?This condition can go away on its own within 3-5 days. But if the condition is caused by germs  (bacteria) and does not go away on its own, or if it keeps coming back, your doctor may: ?Give you antibiotic medicines. ?Give you medicines for pain. ?Follow these instructions at home: ?Take over-the-counter and prescription medicines only as told by your doctor. ?If you were prescribed an antibiotic medicine, take it as told by your doctor. Do not stop taking it even if you start to feel better. ?Keep all follow-up visits. ?Contact a doctor if: ?You have bleeding from your nose. ?There is a lump on your neck. ?You are not feeling better in 5 days. ?You feel worse instead of better. ?Get help right away if: ?You have pain that is not helped with medicine. ?You have swelling, redness, or pain around your ear. ?You get a stiff neck. ?You cannot move part of your face (paralysis). ?You notice that the bone behind your ear hurts when you touch it. ?You get a very bad headache. ?Summary ?Otitis media means that the middle ear is red, swollen, and full of fluid. ?This condition usually goes away on its own. ?If the problem does not go away, treatment may be needed. You may be given medicines to treat the infection or to treat your pain. ?If you were prescribed an antibiotic medicine, take it as told by your doctor. Do not stop taking it even if you start to feel better. ?Keep all follow-up visits. ?This information is not intended to replace advice given to you  by your health care provider. Make sure you discuss any questions you have with your health care provider. ?Document Revised: 05/05/2020 Document Reviewed: 05/05/2020 ?Elsevier Patient Education ? 2022 [employer] ? ?

## 2021-05-20 NOTE — Assessment & Plan Note (Signed)
-   amoxicillin-clavulanate (AUGMENTIN) 875-125 MG tablet; Take 1 tablet by mouth 2 (two) times daily.  Dispense: 20 tablet; Refill: 0 ?- predniSONE (DELTASONE) 10 MG tablet; Take 4 tabs for 2 days, then 3 tabs for 2 days, then 2 tabs for 2 days, then 1 tab for 2 days, then stop  Dispense: 20 tablet; Refill: 0 ? ?Follow up: ? ?Follow up as needed ? ?

## 2021-05-20 NOTE — Progress Notes (Signed)
? ?@Patient  ID: , female    DOB: December 14, 1975, 46 y.o.   MRN: [id number] ? ?Chief Complaint  ?Patient presents with  ? Ear Pain  ?  Patient is here today for severe left ear pain and body aches she has been having. ?Patient states that she has been having the ear pain since Monday and her body aches have been an going. Patient states that she was given Tylenol #3 medication for her bodyaches but it is not working. Patient already has an urgent referral that was put in by Ms. Passmore on 05/12/21 and that is being followed up on today.    ? ? ?Referring provider: ?07/12/21 I, NP ? ? ?HPI ? ?Patient presents today for left ear pain. She states this has been bothering her for 3 days. She was recently seen in the ED for URI.  She was not given any prescription prescription for this issue.  Upon exam patient does appear to have left otitis media.  Her right ear is impacted with cerumen.  We discussed that patient will need to return for an ear wash in about a week.  Patient did have COVID testing and was negative for COVID and flu in the ED.  Her chest x-ray was clear. Denies f/c/s, n/v/d, hemoptysis, PND, chest pain or edema. ? ? ? ?Allergies  ?Allergen Reactions  ? Tapazole [Methimazole] Hives and Shortness Of Breath  ? ? ?Immunization History  ?Administered Date(s) Administered  ? Moderna SARS-COV2 Booster Vaccination 05/26/2020  ? Moderna Sars-Covid-2 Vaccination 04/19/2019, 05/22/2019  ? PPD Test 06/15/2016  ? Tdap 10/14/2013  ? ? ?Past Medical History:  ?Diagnosis Date  ? Anemia 05/2018  ? Asthma   ? Chronic back pain   ? Dysmenorrhea 05/2020  ? Graves disease   ? I-131 ablation late 07/2012  ? Hypothyroidism   ? Mediastinal mass   ? Menorrhagia with regular cycle 05/2020  ? 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 ? ?Tobacco History: ?Social History  ? ?Tobacco Use  ?Smoking Status Some Days  ? Packs/day: 0.25  ? Years: 7.00  ? Pack years: 1.75  ? Types: Cigarettes  ? Last attempt to quit: 09/14/2013  ?  Years since quitting: 7.6  ?Smokeless Tobacco Never  ? ?Ready to quit: Not Answered ?Counseling given: Not Answered ? ? ?Outpatient Encounter Medications as of 05/20/2021  ?Medication Sig  ? Acetaminophen-Codeine (TYLENOL/CODEINE #3) 300-30 MG tablet Take 1 tablet by mouth every 4 (four) hours as needed for pain.  ? albuterol (VENTOLIN HFA) 108 (90 Base) MCG/ACT inhaler Inhale 1-2 puffs into the lungs every 6 (six) hours as needed for wheezing or shortness of breath.  ? amoxicillin-clavulanate (AUGMENTIN) 875-125 MG tablet Take 1 tablet by mouth 2 (two) times daily.  ? benzonatate (TESSALON) 100 MG capsule Take 1 capsule (100 mg total) by mouth every 8 (eight) hours as needed for cough.  ? cyclobenzaprine (FLEXERIL) 10 MG tablet Take 1 tablet (10 mg total) by mouth 3 (three) times daily as needed for muscle spasms.  ? D3-50 1.25 MG (50000 UT) capsule Take 50,000 Units by mouth once a week.  ? levothyroxine (SYNTHROID) 50 MCG tablet Take 2 tablets (100 mcg total) by mouth daily.  ? meloxicam (MOBIC) 15 MG tablet Take 1 tablet (15 mg total) by mouth daily.  ? montelukast (SINGULAIR) 10 MG tablet Take 1 tablet (10 mg total) by mouth at bedtime.  ? predniSONE (DELTASONE) 10 MG tablet Take 4 tabs for 2 days, then 3  tabs for 2 days, then 2 tabs for 2 days, then 1 tab for 2 days, then stop  ? Vitamin D, Ergocalciferol, (DRISDOL) 1.25 MG (50000 UNIT) CAPS capsule Take 1 capsule (50,000 Units total) by mouth every 7 (seven) days.  ? ?No facility-administered encounter medications on file as of 05/20/2021.  ? ? ? ?Review of Systems ? ?Review of Systems  ?Constitutional: Negative.   ?HENT:  Positive for ear pain.   ?Cardiovascular: Negative.   ?Gastrointestinal: Negative.   ?Allergic/Immunologic: Negative.   ?Neurological: Negative.   ?Psychiatric/Behavioral: Negative.     ? ? ? ?Physical Exam ? ?BP 136/63   Pulse 88   Temp 98.3 ?F (36.8 ?C)   Ht 5\' 3"  (1.6 m)   Wt 192 lb 12.8 oz (87.5 kg)   LMP 05/10/2021   SpO2 98%    BMI 34.15 kg/m?  ? ?Wt Readings from Last 5 Encounters:  ?05/20/21 192 lb 12.8 oz (87.5 kg)  ?05/18/21 192 lb (87.1 kg)  ?05/06/21 192 lb 4 oz (87.2 kg)  ?03/25/21 194 lb 9.6 oz (88.3 kg)  ?03/24/21 199 lb 15.3 oz (90.7 kg)  ? ? ? ?Physical Exam ?Vitals and nursing note reviewed.  ?Constitutional:   ?   General: She is not in acute distress. ?   Appearance: She is well-developed.  ?HENT:  ?   Right Ear: There is impacted cerumen.  ?   Left Ear: Tympanic membrane is erythematous and bulging.  ?Cardiovascular:  ?   Rate and Rhythm: Normal rate and regular rhythm.  ?Pulmonary:  ?   Effort: Pulmonary effort is normal.  ?   Breath sounds: Normal breath sounds.  ?Neurological:  ?   Mental Status: She is alert and oriented to person, place, and time.  ? ? ? ?Lab Results: ? ?CBC ?   ?Component Value Date/Time  ? WBC 3.8 (L) 05/18/2021 1105  ? RBC 3.63 (L) 05/18/2021 1105  ? HGB 9.7 (L) 05/18/2021 1105  ? HGB 9.3 (L) 02/11/2021 1118  ? HCT 30.6 (L) 05/18/2021 1105  ? HCT 29.1 (L) 02/11/2021 1118  ? PLT 313 05/18/2021 1105  ? PLT 266 02/11/2021 1118  ? MCV 84.3 05/18/2021 1105  ? MCV 88 02/11/2021 1118  ? MCH 26.7 05/18/2021 1105  ? MCHC 31.7 05/18/2021 1105  ? RDW 16.1 (H) 05/18/2021 1105  ? RDW 15.6 (H) 02/11/2021 1118  ? LYMPHSABS 1.1 05/18/2021 1105  ? LYMPHSABS 1.3 02/11/2021 1118  ? MONOABS 0.3 05/18/2021 1105  ? EOSABS 0.0 05/18/2021 1105  ? EOSABS 0.0 02/11/2021 1118  ? BASOSABS 0.0 05/18/2021 1105  ? BASOSABS 0.0 02/11/2021 1118  ? ? ?BMET ?   ?Component Value Date/Time  ? NA 138 05/18/2021 1105  ? NA 140 02/11/2021 1118  ? K 3.7 05/18/2021 1105  ? CL 105 05/18/2021 1105  ? CO2 26 05/18/2021 1105  ? GLUCOSE 80 05/18/2021 1105  ? BUN 13 05/18/2021 1105  ? BUN 17 02/11/2021 1118  ? CREATININE 0.71 05/18/2021 1105  ? CALCIUM 8.9 05/18/2021 1105  ? GFRNONAA >60 05/18/2021 1105  ? GFRAA 84 02/26/2019 1405  ? ? ?BNP ?No results found for: BNP ? ?ProBNP ?No results found for: PROBNP ? ?Imaging: ?DG Chest Portable 1  View ? ?Result Date: 05/18/2021 ?CLINICAL DATA:  Provided history: Cough, shortness of breath. Additional history provided: Shortness of breath for 3 days. Generalized body aches. EXAM: PORTABLE CHEST 1 VIEW COMPARISON:  Prior chest radiographs 12/18/2015 and earlier. FINDINGS: Heart size within normal limits.  No appreciable airspace consolidation. Mild linear atelectasis versus scarring within the left mid to lower lung field. No evidence of pleural effusion or pneumothorax. No acute bony abnormality identified. IMPRESSION: Mild linear atelectasis versus scarring within the left mid-to-lower lung field. Otherwise, no evidence of acute cardiopulmonary abnormality. Electronically Signed   By: Jackey Loge D.O.   On: 05/18/2021 10:44   ? ? ?Assessment & Plan:  ? ?Left otitis media ?- amoxicillin-clavulanate (AUGMENTIN) 875-125 MG tablet; Take 1 tablet by mouth 2 (two) times daily.  Dispense: 20 tablet; Refill: 0 ?- predniSONE (DELTASONE) 10 MG tablet; Take 4 tabs for 2 days, then 3 tabs for 2 days, then 2 tabs for 2 days, then 1 tab for 2 days, then stop  Dispense: 20 tablet; Refill: 0 ? ?Follow up: ? ?Follow up as needed ? ? ? ? ?Ivonne Andrew, NP ?05/20/2021 ? ?

## 2021-05-21 ENCOUNTER — Encounter: Payer: 59 | Attending: Physical Medicine & Rehabilitation | Admitting: Physical Medicine & Rehabilitation

## 2021-05-21 ENCOUNTER — Encounter: Payer: Self-pay | Admitting: Physical Medicine & Rehabilitation

## 2021-05-21 VITALS — BP 128/84 | HR 71 | Ht 63.0 in | Wt 192.2 lb

## 2021-05-21 DIAGNOSIS — M25561 Pain in right knee: Secondary | ICD-10-CM | POA: Insufficient documentation

## 2021-05-21 DIAGNOSIS — M25562 Pain in left knee: Secondary | ICD-10-CM | POA: Insufficient documentation

## 2021-05-21 DIAGNOSIS — M1812 Unilateral primary osteoarthritis of first carpometacarpal joint, left hand: Secondary | ICD-10-CM | POA: Diagnosis not present

## 2021-05-21 DIAGNOSIS — G8929 Other chronic pain: Secondary | ICD-10-CM | POA: Diagnosis present

## 2021-05-21 DIAGNOSIS — M791 Myalgia, unspecified site: Secondary | ICD-10-CM | POA: Diagnosis not present

## 2021-05-21 MED ORDER — PREGABALIN 50 MG PO CAPS: 50.0000 mg | ORAL_CAPSULE | Freq: Three times a day (TID) | ORAL | 1 refills | Status: DC

## 2021-05-21 NOTE — Progress Notes (Signed)
? ?Subjective:  ? ? Patient ID: Lacey Mccarty, female    DOB: 1975/05/19, 46 y.o.   MRN: [id number] ? ?HPI ?CC:  Widespread joint and muscle  pain ? ?46 yo female with obesity, hypothyroidism related to history of Graves' disease with radiation to the thyroid.  Has had difficulty in achieving adequate supplementation levels. ?The patient complains of widespread body pain pointing to her shoulders elbows legs and thighs. ?Worst areas are Left base of thumb, both knees ? 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  Also noted to have vitamin D deficiency and recommendations for supplementation. 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  This was done at an outside facility due to insurance reasons ?Taking muscle relaxer, cyclobenzaprine, as needed not helping, T#3 not helping, has tried motrin/ibuprofen without much relief, has also been on Mobic ?Smokes 6-7 cig/day ?ETOH very occ <1x per month  ? ?PDQ 9 scores 5 ? ?The patient is functionally independent requiring no assistive devices.  She is not working outside the home ?Pain Inventory ?Average Pain 10 ?Pain Right Now 10 ?My pain is aching ? ?In the last 24 hours, has pain interfered with the following? ?General activity 7 ?Relation with others 7 ?Enjoyment of life 1 ?What TIME of day is your pain at its worst? evening ?Sleep (in general) Fair ? ?Pain is worse with: walking, bending, and sitting ?Pain improves with:  Nothing helps ?Relief from Meds: 0 ? ?walk without assistance ?ability to climb steps?  yes ?do you drive?  yes ? ?not employed: date last employed . ?Do you have any goals in this area?  yes ? ?No problems in this area ? ?Any changes since last visit?  no ? ?Any changes since last visit?  no ? ? ? ?Family History  ?Problem Relation Age of Onset  ? HIV Mother   ? Diabetes Mellitus II Mother   ? Hypertension Father   ? Cancer Father   ?     Lung  ?  Allergies Child   ? Allergies Child   ? Colon cancer Paternal Grandmother   ? Stomach cancer Paternal Grandmother   ? Breast cancer Maternal Aunt   ? Asthma Maternal Aunt   ? ?Social History  ? ?Socioeconomic History  ? Marital status: Single  ?  Spouse name: Not on file  ? Number of children: Not on file  ? Years of education: Not on file  ? Highest education level: Not on file  ?Occupational History  ? Not on file  ?Tobacco Use  ? Smoking status: Some Days  ?  Packs/day: 0.25  ?  Years: 7.00  ?  Pack years: 1.75  ?  Types: Cigarettes  ?  Last attempt to quit: 09/14/2013  ?  Years since quitting: 7.6  ? Smokeless tobacco: Never  ?Vaping Use  ? Vaping Use: Never used  ?Substance and Sexual Activity  ? Alcohol use: Yes  ?  Comment: occasional  ? Drug use: No  ? Sexual activity: Yes  ?Other Topics Concern  ? Not on file  ?Social History Narrative  ? ** Merged History Encounter **  ?   ? Regular exercise: no  ? Caffeine use: none  ?    ? ?Social Determinants of Health  ? ?Financial Resource Strain: Not on file  ?Food Insecurity: Not on file  ?Transportation Needs: Not on file  ?Physical  Activity: Not on file  ?Stress: Not on file  ?Social Connections: Not on file  ? ?Past Surgical History:  ?Procedure Laterality Date  ? CESAREAN SECTION    ? LIPOSUCTION    ? NM THYROID STIM SUPRESS    ? ?Past Medical History:  ?Diagnosis Date  ? Anemia 05/2018  ? Asthma   ? Chronic back pain   ? Dysmenorrhea 05/2020  ? Graves disease   ? I-131 ablation late 07/2012  ? Hypothyroidism   ? Mediastinal mass   ? Menorrhagia with regular cycle 05/2020  ? 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 ?BP 128/84   Pulse 71   Ht 5\' 3"  (1.6 m)   Wt 192 lb 3.2 oz (87.2 kg)   LMP 05/10/2021   SpO2 97%   BMI 34.05 kg/m?  ? ?Opioid Risk Score:   ?Fall Risk Score:  `1 ? ?Depression screen PHQ 2/9 ? ? ?  05/21/2021  ? 12:51 PM 03/25/2021  ? 11:13 AM 02/11/2021  ? 10:44 AM 01/08/2020  ?  3:18 PM 06/04/2019  ?  2:09 PM 03/19/2016  ?  1:49 PM 02/18/2015  ?  1:04 PM   ?Depression screen PHQ 2/9  ?Decreased Interest 2 0 1 0 0 0 0  ?Down, Depressed, Hopeless 1 0 2 0 0 0 0  ?PHQ - 2 Score 3 0 3 0 0 0 0  ?Altered sleeping 0  0      ?Tired, decreased energy 3  2      ?Change in appetite   0      ?Feeling bad or failure about yourself  0  2      ?Trouble concentrating 0  1      ?Moving slowly or fidgety/restless 0  0      ?Suicidal thoughts 0  0      ?PHQ-9 Score 6  8      ?Difficult doing work/chores Not difficult at all  Somewhat difficult      ?  ? ?Review of Systems  ?Constitutional: Negative.   ?HENT: Negative.    ?Eyes: Negative.   ?Respiratory: Negative.    ?Cardiovascular: Negative.   ?Gastrointestinal: Negative.   ?Endocrine: Negative.   ?Genitourinary: Negative.   ?Musculoskeletal: Negative.   ?Skin: Negative.   ?Allergic/Immunologic: Negative.   ?Neurological: Negative.   ?Hematological: Negative.   ?Psychiatric/Behavioral: Negative.    ? ?   ?Objective:  ? Physical Exam ?Vitals and nursing note reviewed.  ?Constitutional:   ?   Appearance: She is obese.  ?HENT:  ?   Head: Normocephalic and atraumatic.  ?Eyes:  ?   Extraocular Movements: Extraocular movements intact.  ?   Conjunctiva/sclera: Conjunctivae normal.  ?   Pupils: Pupils are equal, round, and reactive to light.  ?Skin: ?   General: Skin is warm and dry.  ?Neurological:  ?   Mental Status: She is alert and oriented to person, place, and time.  ?   Cranial Nerves: Cranial nerves 2-12 are intact. No dysarthria.  ?   Motor: Motor function is intact.  ?   Coordination: Coordination is intact.  ?   Gait: Gait is intact.  ?   Comments: Motor strength is 5/5 bilateral deltoid, bicep, tricep, grip, hip flexor, knee extensor, ankle dorsiflexor and plantar flexor ?Negative straight leg raising bilaterally ?Ambulates without assistive device no evidence of toe drag or knee instability  ?Psychiatric:     ?   Mood and Affect: Mood normal.     ?  Behavior: Behavior normal.  ? ?Musculoskeletal ?Patient has diffuse tenderness  with light palpation of the upper trapezius cervical paraspinal thoracic paraspinal lumbar paraspinal musculature as well as bilateral thighs bilateral elbows and shoulders.  No pain with upper extremity or lower extremity range of motion ?Knees show no evidence of effusion there is some tenderness along the patella tendon bilaterally as well as along the medial and lateral joint lines bilaterally.  No pain with range of motion no limitation with range of motion. ? ? ? ?   ?Assessment & Plan:  ? ?1.  Diffuse muscle pain likely related to her hypothyroidism.  I would expect that as she becomes euthyroid, her pain should subside.  If it does not she may have some underlying diagnosis of fibromyalgia.  Nevertheless given the clinical presentation we will start her on pregabalin 50 mg 3 times daily.  We discussed that she would need to titrate upward if the starting dose is not effective.  We will see her back in 1 month to evaluate.  She may stop her Tylenol with codeine. ?2.  Left first carpometacarpal joint pain recommend diclofenac gel as well as a thumb spica splint which she may obtain at her local pharmacy.  If after 4 to 6 weeks of treatment she has no improvements would do ultrasound-guided first carpometacarpal corticosteroid injection ?

## 2021-05-21 NOTE — Patient Instructions (Addendum)
Diclofenac gel to base of thumb 4 x per day, if no better may need steroid injection  ?Left thumb spica splint to be worn during the day  ?

## 2021-05-25 ENCOUNTER — Inpatient Hospital Stay: Payer: 59 | Attending: Hematology and Oncology

## 2021-05-25 ENCOUNTER — Inpatient Hospital Stay (HOSPITAL_BASED_OUTPATIENT_CLINIC_OR_DEPARTMENT_OTHER): Payer: 59 | Admitting: Hematology and Oncology

## 2021-05-25 ENCOUNTER — Other Ambulatory Visit: Payer: Self-pay

## 2021-05-25 DIAGNOSIS — E890 Postprocedural hypothyroidism: Secondary | ICD-10-CM

## 2021-05-25 DIAGNOSIS — N920 Excessive and frequent menstruation with regular cycle: Secondary | ICD-10-CM | POA: Diagnosis not present

## 2021-05-25 DIAGNOSIS — F17210 Nicotine dependence, cigarettes, uncomplicated: Secondary | ICD-10-CM | POA: Diagnosis not present

## 2021-05-25 DIAGNOSIS — E559 Vitamin D deficiency, unspecified: Secondary | ICD-10-CM | POA: Insufficient documentation

## 2021-05-25 DIAGNOSIS — D72819 Decreased white blood cell count, unspecified: Secondary | ICD-10-CM

## 2021-05-25 DIAGNOSIS — E0500 Thyrotoxicosis with diffuse goiter without thyrotoxic crisis or storm: Secondary | ICD-10-CM | POA: Diagnosis not present

## 2021-05-25 DIAGNOSIS — D509 Iron deficiency anemia, unspecified: Secondary | ICD-10-CM | POA: Insufficient documentation

## 2021-05-25 DIAGNOSIS — D500 Iron deficiency anemia secondary to blood loss (chronic): Secondary | ICD-10-CM | POA: Diagnosis not present

## 2021-05-25 DIAGNOSIS — R635 Abnormal weight gain: Secondary | ICD-10-CM | POA: Diagnosis not present

## 2021-05-25 DIAGNOSIS — J387 Other diseases of larynx: Secondary | ICD-10-CM

## 2021-05-25 LAB — IRON AND IRON BINDING CAPACITY (CC-WL,HP ONLY)
Iron: 31 ug/dL (ref 28–170)
Saturation Ratios: 8 % — ABNORMAL LOW (ref 10.4–31.8)
TIBC: 413 ug/dL (ref 250–450)
UIBC: 382 ug/dL (ref 148–442)

## 2021-05-25 LAB — CBC WITH DIFFERENTIAL (CANCER CENTER ONLY)
Abs Immature Granulocytes: 0.01 10*3/uL (ref 0.00–0.07)
Basophils Absolute: 0 10*3/uL (ref 0.0–0.1)
Basophils Relative: 0 %
Eosinophils Absolute: 0 10*3/uL (ref 0.0–0.5)
Eosinophils Relative: 1 %
HCT: 31.9 % — ABNORMAL LOW (ref 36.0–46.0)
Hemoglobin: 10.2 g/dL — ABNORMAL LOW (ref 12.0–15.0)
Immature Granulocytes: 0 %
Lymphocytes Relative: 27 %
Lymphs Abs: 1.6 10*3/uL (ref 0.7–4.0)
MCH: 26.4 pg (ref 26.0–34.0)
MCHC: 32 g/dL (ref 30.0–36.0)
MCV: 82.4 fL (ref 80.0–100.0)
Monocytes Absolute: 0.5 10*3/uL (ref 0.1–1.0)
Monocytes Relative: 8 %
Neutro Abs: 3.9 10*3/uL (ref 1.7–7.7)
Neutrophils Relative %: 64 %
Platelet Count: 339 10*3/uL (ref 150–400)
RBC: 3.87 MIL/uL (ref 3.87–5.11)
RDW: 16.2 % — ABNORMAL HIGH (ref 11.5–15.5)
WBC Count: 6 10*3/uL (ref 4.0–10.5)
nRBC: 0 % (ref 0.0–0.2)

## 2021-05-25 LAB — FERRITIN: Ferritin: 7 ng/mL — ABNORMAL LOW (ref 11–307)

## 2021-05-25 LAB — RETICULOCYTES
Immature Retic Fract: 23.9 % — ABNORMAL HIGH (ref 2.3–15.9)
RBC.: 3.87 MIL/uL (ref 3.87–5.11)
Retic Count, Absolute: 48 10*3/uL (ref 19.0–186.0)
Retic Ct Pct: 1.2 % (ref 0.4–3.1)

## 2021-05-25 LAB — SEDIMENTATION RATE: Sed Rate: 35 mm/hr — ABNORMAL HIGH (ref 0–22)

## 2021-05-25 LAB — VITAMIN B12: Vitamin B-12: 284 pg/mL (ref 180–914)

## 2021-05-25 LAB — VITAMIN D 25 HYDROXY (VIT D DEFICIENCY, FRACTURES): Vit D, 25-Hydroxy: 137.58 ng/mL — ABNORMAL HIGH (ref 30–100)

## 2021-05-26 ENCOUNTER — Telehealth: Payer: Self-pay

## 2021-05-26 ENCOUNTER — Encounter: Payer: Self-pay | Admitting: Hematology and Oncology

## 2021-05-26 DIAGNOSIS — J387 Other diseases of larynx: Secondary | ICD-10-CM | POA: Insufficient documentation

## 2021-05-26 NOTE — Telephone Encounter (Signed)
Called per Dr. Alvy Bimler, instructed to stop vitamin D and given yesterday results. Start Iron OTC at night, any dosage and any brand. Keep appts with ENT. Follow up with ENT and PCP, no need to follow up with Dr. Alvy Bimler. She verbalized understanding. ?

## 2021-05-26 NOTE — Assessment & Plan Note (Signed)
Her vitamin D level is high ?We will discontinue vitamin D supplement ?

## 2021-05-26 NOTE — Assessment & Plan Note (Signed)
She has borderline iron deficiency ?She is encouraged to take iron supplement once a day at night ?

## 2021-05-26 NOTE — Progress Notes (Signed)
? ?[facility] ?OFFICE PROGRESS NOTE ? ?Lacey Mccarty I, NP ? ?ASSESSMENT & PLAN:  ?Laryngeal mass ?She is being evaluated at [facility] ENT service ?I encouraged her to keep her appointment ?Due to her smoking history, she could be at risk of malignancy ? ?Iron deficiency anemia ?She has borderline iron deficiency ?She is encouraged to take iron supplement once a day at night ? ?Vitamin D deficiency ?Her vitamin D level is high ?We will discontinue vitamin D supplement ? ?No orders of the defined types were placed in this encounter. ? ? ?The total time spent in the appointment was 20 minutes encounter with patients including review of chart and various tests results, discussions about plan of care and coordination of care plan ? ? All questions were answered. The patient knows to call the clinic with any problems, questions or concerns. No barriers to learning was detected. ? ? ? Artis Delay, MD ?4/18/20239:12 AM ? ?INTERVAL HISTORY: ?Lacey Mccarty 46 y.o. 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 female is here because of abnormal CBC ?She is here accompanied by her spouse, Waynetta Sandy ?The patient has been battling debilitating autoimmune disorder/Graves' disease and had received radioactive iodine therapy ?I have reviewed her records dated back to 2010 ?She had normal baseline CBC then ?Starting around late 2014/2015, she was found to have abnormal CBC with leukopenia ?She also started to have heavy menstruation ?The patient was initially treated and managed by endocrinologist but due to loss of follow-up, she is currently following with her primary care doctor for  management ?She has chronic joint pain and was found to have vitamin D deficiency but she has not been taking her vitamin D supplement ?The patient is somewhat depressed and frustrated due to lack of improvement ?She has excessive weight gain and poor energy ?Her dietary choices has been poor ?The patient denies any recent signs or symptoms of bleeding such as spontaneous epistaxis, hematuria or hematochezia. ?She has not been needing frequent antibiotics treatment despite chronic leukopenia ? ?She had no prior history or diagnosis of cancer. Her age appropriate screening programs are up-to-date. ?The patient is a smoker and currently smokes 1/2 pack of cigarettes per day for the last last 15 years ?Repeat labs from 05/25/2021 showed resolution of leukopenia, borderline anemia, normal vitamin B12 level and high vitamin D level. ? ?I have reviewed the past medical history, past surgical history, social history and family history with the patient and they are unchanged from previous note. ? ?ALLERGIES:  is allergic to tapazole [methimazole]. ? ?MEDICATIONS:  ?Current Outpatient Medications  ?Medication Sig Dispense Refill  ? Acetaminophen-Codeine (TYLENOL/CODEINE #3) 300-30 MG tablet Take 1 tablet by mouth every 4 (four) hours as needed for pain. 30 tablet 0  ? albuterol (VENTOLIN HFA) 108 (90 Base) MCG/ACT inhaler Inhale 1-2 puffs into the lungs every 6 (six) hours as needed for wheezing or shortness of breath. 8 g 0  ? amoxicillin-clavulanate (AUGMENTIN) 875-125 MG tablet Take 1 tablet by mouth 2 (two) times daily. 20 tablet 0  ? benzonatate (TESSALON) 100 MG capsule Take 1 capsule (100 mg total) by mouth every 8 (eight) hours as  needed for cough. 21 capsule 0  ? cyclobenzaprine (FLEXERIL) 10 MG tablet Take 1 tablet (10 mg total) by mouth 3 (three) times daily as needed for muscle spasms. 30 tablet 0  ? D3-50 1.25 MG (50000 UT) capsule Take 50,000 Units by mouth once a week.    ? levothyroxine (SYNTHROID) 50 MCG  tablet Take 2 tablets (100 mcg total) by mouth daily. 60 tablet 1  ? meloxicam (MOBIC) 15 MG tablet Take 1 tablet (15 mg total) by mouth daily. 30 tablet 0  ? montelukast (SINGULAIR) 10 MG tablet Take 1 tablet (10 mg total) by mouth at bedtime. 30 tablet 2  ? predniSONE (DELTASONE) 10 MG tablet Take 4 tabs for 2 days, then 3 tabs for 2 days, then 2 tabs for 2 days, then 1 tab for 2 days, then stop 20 tablet 0  ? pregabalin (LYRICA) 50 MG capsule Take 1 capsule (50 mg total) by mouth 3 (three) times daily. 90 capsule 1  ? Vitamin D, Ergocalciferol, (DRISDOL) 1.25 MG (50000 UNIT) CAPS capsule Take 1 capsule (50,000 Units total) by mouth every 7 (seven) days. 5 capsule 6  ? ?No current facility-administered medications for this visit.  ?  ? ?REVIEW OF SYSTEMS:   ?Constitutional: Denies fevers, chills or night sweats ?Eyes: Denies blurriness of vision ?Ears, nose, mouth, throat, and face: Denies mucositis or sore throat ?Respiratory: Denies cough, dyspnea or wheezes ?Cardiovascular: Denies palpitation, chest discomfort or lower extremity swelling ?Gastrointestinal:  Denies nausea, heartburn or change in bowel habits ?Skin: Denies abnormal skin rashes ?Lymphatics: Denies new lymphadenopathy or easy bruising ?Neurological:Denies numbness, tingling or new weaknesses ?Behavioral/Psych: Mood is stable, no new changes  ?All other systems were reviewed with the patient and are negative. ? ?PHYSICAL EXAMINATION: ?ECOG PERFORMANCE STATUS: 1 - Symptomatic but completely ambulatory ? ?Vitals:  ? 05/25/21 1254  ?BP: 119/76  ?Pulse: 83  ?Resp: 18  ?Temp: 97.6 ?F (36.4 ?C)  ?SpO2: 100%  ? ?Filed Weights  ? 05/25/21 1254  ?Weight: 197 lb 6.4 oz (89.5 kg)  ? ? ?GENERAL:alert, no distress and comfortable ?NEURO: alert & oriented x 3 with fluent speech, no focal motor/sensory deficits ? ?LABORATORY DATA:  ?I have reviewed the data as listed ? ?   ?Component Value Date/Time  ? NA 138 05/18/2021 1105  ? NA 140 02/11/2021 1118  ? K 3.7  05/18/2021 1105  ? CL 105 05/18/2021 1105  ? CO2 26 05/18/2021 1105  ? GLUCOSE 80 05/18/2021 1105  ? BUN 13 05/18/2021 1105  ? BUN 17 02/11/2021 1118  ? CREATININE 0.71 05/18/2021 1105  ? CALCIUM 8.9 05/18/2021 1105  ? PROT 7.4 02/11/2021 1118  ? ALBUMIN 4.6 02/11/2021 1118  ? AST 32 02/11/2021 1118  ? ALT 21 02/11/2021 1118  ? ALKPHOS 53 02/11/2021 1118  ? BILITOT 0.3 02/11/2021 1118  ? GFRNONAA >60 05/18/2021 1105  ? GFRAA 84 02/26/2019 1405  ? ? ?No results found for: SPEP, UPEP ? ?Lab Results  ?Component Value Date  ? WBC 6.0 05/25/2021  ? NEUTROABS 3.9 05/25/2021  ? HGB 10.2 (L) 05/25/2021  ? HCT 31.9 (L) 05/25/2021  ? MCV 82.4 05/25/2021  ? PLT 339 05/25/2021  ? ? ?  Chemistry   ?   ?Component Value Date/Time  ? NA 138 05/18/2021 1105  ? NA 140 02/11/2021 1118  ? K 3.7 05/18/2021 1105  ? CL 105 05/18/2021 1105  ? CO2 26 05/18/2021 1105  ? BUN 13 05/18/2021 1105  ? BUN 17  02/11/2021 1118  ? CREATININE 0.71 05/18/2021 1105  ?    ?Component Value Date/Time  ? CALCIUM 8.9 05/18/2021 1105  ? ALKPHOS 53 02/11/2021 1118  ? AST 32 02/11/2021 1118  ? ALT 21 02/11/2021 1118  ? BILITOT 0.3 02/11/2021 1118  ?  ? ?

## 2021-05-26 NOTE — Assessment & Plan Note (Signed)
She is being evaluated at [facility] ENT service ?I encouraged her to keep her appointment ?Due to her smoking history, she could be at risk of malignancy ?

## 2021-06-17 ENCOUNTER — Ambulatory Visit: Payer: Self-pay | Admitting: Nurse Practitioner

## 2021-06-17 ENCOUNTER — Other Ambulatory Visit: Payer: 59

## 2021-06-17 DIAGNOSIS — E890 Postprocedural hypothyroidism: Secondary | ICD-10-CM

## 2021-06-18 LAB — TSH+T4F+T3FREE
Free T4: 0.27 ng/dL — ABNORMAL LOW (ref 0.82–1.77)
T3, Free: 1.2 pg/mL — ABNORMAL LOW (ref 2.0–4.4)
TSH: 54.6 u[IU]/mL — ABNORMAL HIGH (ref 0.450–4.500)

## 2021-06-19 ENCOUNTER — Other Ambulatory Visit: Payer: Self-pay | Admitting: Nurse Practitioner

## 2021-06-19 DIAGNOSIS — E890 Postprocedural hypothyroidism: Secondary | ICD-10-CM

## 2021-06-19 DIAGNOSIS — Z760 Encounter for issue of repeat prescription: Secondary | ICD-10-CM

## 2021-06-19 MED ORDER — LEVOTHYROXINE SODIUM 50 MCG PO TABS: 150.0000 ug | ORAL_TABLET | Freq: Every day | ORAL | 1 refills | Status: DC

## 2021-07-01 ENCOUNTER — Encounter: Payer: Self-pay | Admitting: Otolaryngology

## 2021-07-03 ENCOUNTER — Encounter: Payer: 59 | Attending: Physical Medicine & Rehabilitation | Admitting: Physical Medicine & Rehabilitation

## 2021-07-03 ENCOUNTER — Encounter: Payer: Self-pay | Admitting: Physical Medicine & Rehabilitation

## 2021-07-03 VITALS — BP 135/92 | HR 78 | Ht 64.0 in | Wt 195.2 lb

## 2021-07-03 DIAGNOSIS — M791 Myalgia, unspecified site: Secondary | ICD-10-CM

## 2021-07-03 DIAGNOSIS — E890 Postprocedural hypothyroidism: Secondary | ICD-10-CM

## 2021-07-03 DIAGNOSIS — M1812 Unilateral primary osteoarthritis of first carpometacarpal joint, left hand: Secondary | ICD-10-CM

## 2021-07-03 MED ORDER — PREGABALIN 100 MG PO CAPS: 100.0000 mg | ORAL_CAPSULE | Freq: Three times a day (TID) | ORAL | 1 refills | Status: DC

## 2021-07-03 NOTE — Patient Instructions (Signed)
Will increase lyrica (pregabalin) Feel the pain will improve once thyroid level normalize which may take several months Will injection base of thumb in 6 wks

## 2021-07-03 NOTE — Progress Notes (Signed)
Subjective:    Patient ID: Lacey Mccarty, female    DOB: 10/22/1975, 46 y.o.   MRN: [id number]  HPI 46 year old female with history of Graves' disease treated with radioactive iodine now with hypothyroidism.  She has widespread body pain as well as fatigue.  Her last TSH was approximately 50 on 06/17/2021.  Her Synthroid dose was increased from 50 mcg to 150 mcg and she now has an appointment with endocrinology. She was started on Lyrica at last visit on 05/21/2021.  50 mg 3 times daily, no significant improvement on this.  She felt a little tired on this medicine. Interval medical history scheduled for removal of glottic lesion per ENT next week. Originally scheduled at this clinic for carpal metacarpal injection on the left side at base of thumb.  This was to be done under ultrasound guidance.  Given her upcoming surgery was recommended in her preoperative orders not to have any corticosteroids. Pain Inventory Average Pain 9 Pain Right Now 9 My pain is aching  In the last 24 hours, has pain interfered with the following? General activity 5 Relation with others 0 Enjoyment of life 0 What TIME of day is your pain at its worst? evening Sleep (in general) Fair  Pain is worse with: sitting Pain improves with: medication Relief from Meds: 0  Family History  Problem Relation Age of Onset   HIV Mother    Diabetes Mellitus II Mother    Hypertension Father    Cancer Father        Lung   Allergies Child    Allergies Child    Colon cancer Paternal Grandmother    Stomach cancer Paternal Grandmother    Breast cancer Maternal Aunt    Asthma Maternal Aunt    Social History   Socioeconomic History   Marital status: Single    Spouse name: Not on file   Number of children: Not on file   Years of education: Not on file   Highest education level: Not on file  Occupational History   Not on file  Tobacco Use   Smoking status: Some Days    Packs/day: 0.25    Years: 15.00    Pack  years: 3.75    Types: Cigarettes   Smokeless tobacco: Never   Tobacco comments:    Started smoking 2007  Vaping Use   Vaping Use: Never used  Substance and Sexual Activity   Alcohol use: Yes    Comment: occasional   Drug use: No   Sexual activity: Yes  Other Topics Concern   Not on file  Social History Narrative   ** Merged History Encounter **      Regular exercise: no   Caffeine use: none       Social Determinants of Health   Financial Resource Strain: Not on file  Food Insecurity: Not on file  Transportation Needs: Not on file  Physical Activity: Not on file  Stress: Not on file  Social Connections: Not on file   Past Surgical History:  Procedure Laterality Date   CESAREAN SECTION     LIPOSUCTION     NM THYROID STIM SUPRESS     Past Surgical History:  Procedure Laterality Date   CESAREAN SECTION     LIPOSUCTION     NM THYROID STIM SUPRESS     Past Medical History:  Diagnosis Date   Anemia 05/2018   Asthma    Chronic back pain    Dysmenorrhea 05/2020   Graves  disease    I-131 ablation late 07/2012   Hypothyroidism    Mediastinal mass    Menorrhagia with regular cycle 05/2020   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 2/9     07/03/2021   11:03 AM 05/21/2021   12:51 PM 03/25/2021   11:13 AM 02/11/2021   10:44 AM 01/08/2020    3:18 PM 06/04/2019    2:09 PM 03/19/2016    1:49 PM  Depression screen PHQ 2/9  Decreased Interest 0 2 0 1 0 0 0  Down, Depressed, Hopeless 0 1 0 2 0 0 0  PHQ - 2 Score 0 3 0 3 0 0 0  Altered sleeping  0  0     Tired, decreased energy  3  2     Change in appetite    0     Feeling bad or failure about yourself   0  2     Trouble concentrating  0  1     Moving slowly or fidgety/restless  0  0     Suicidal thoughts  0  0      PHQ-9 Score  6  8     Difficult doing work/chores  Not difficult at all  Somewhat difficult        Review of Systems  Constitutional: Negative.   HENT: Negative.    Eyes: Negative.   Respiratory: Negative.    Cardiovascular: Negative.   Gastrointestinal: Negative.   Endocrine: Negative.   Genitourinary: Negative.   Musculoskeletal: Negative.   Skin: Negative.   Allergic/Immunologic: Negative.   Neurological: Negative.   Hematological: Negative.   Psychiatric/Behavioral: Negative.        Objective:   Physical Exam Vitals and nursing note reviewed.  Constitutional:      Appearance: She is obese.  HENT:     Head: Normocephalic and atraumatic.  Eyes:     Extraocular Movements: Extraocular movements intact.     Conjunctiva/sclera: Conjunctivae normal.     Pupils: Pupils are equal, round, and reactive to light.  Musculoskeletal:     Comments: Tenderness palpation bilateral upper trapezius right levator scapula negative at occipital Positive bilateral lumbar and bilateral lateral hip.  Positive right VMO positive right lateral epicondyle positive bilateral sternocostal  Full range of motion in upper and lower limbs There is tenderness to palpation over the left first carpometacarpal joint also positive Finkelstein's.  Neurological:     Mental Status: She is alert.  Psychiatric:        Mood and Affect: Mood normal.        Behavior: Behavior normal.          Assessment & Plan:   1.  Widespread body pain secondary to hypothyroidism.  Anticipate that as her supplementation becomes adequate to correct TSH that her symptoms should resolve.  In the meantime will increase Lyrica to 100 mg 3 times daily and monitor for sedation. 2.  Left carpal metacarpal joint pain also may have de Quervain's tenosynovitis.  Plan for injection under ultrasound guidance after surgery around 6 weeks.

## 2021-07-09 ENCOUNTER — Encounter
Admission: RE | Disposition: A | Discharge status: Home / Self Care | Payer: Self-pay | Source: Home / Self Care | Attending: Otolaryngology

## 2021-07-09 ENCOUNTER — Ambulatory Visit: Payer: 59 | Admitting: Anesthesiology

## 2021-07-09 ENCOUNTER — Ambulatory Visit
Admission: RE | Admit: 2021-07-09 | Discharge: 2021-07-09 | Disposition: A | Discharge status: Home / Self Care | Payer: 59 | Attending: Otolaryngology | Admitting: Otolaryngology

## 2021-07-09 ENCOUNTER — Other Ambulatory Visit: Payer: Self-pay

## 2021-07-09 ENCOUNTER — Encounter: Payer: Self-pay | Admitting: Otolaryngology

## 2021-07-09 DIAGNOSIS — Z6834 Body mass index (BMI) 34.0-34.9, adult: Secondary | ICD-10-CM | POA: Diagnosis not present

## 2021-07-09 DIAGNOSIS — J387 Other diseases of larynx: Secondary | ICD-10-CM

## 2021-07-09 DIAGNOSIS — E039 Hypothyroidism, unspecified: Secondary | ICD-10-CM | POA: Insufficient documentation

## 2021-07-09 DIAGNOSIS — J45909 Unspecified asthma, uncomplicated: Secondary | ICD-10-CM | POA: Insufficient documentation

## 2021-07-09 DIAGNOSIS — E669 Obesity, unspecified: Secondary | ICD-10-CM | POA: Diagnosis not present

## 2021-07-09 DIAGNOSIS — F17210 Nicotine dependence, cigarettes, uncomplicated: Secondary | ICD-10-CM | POA: Insufficient documentation

## 2021-07-09 DIAGNOSIS — J381 Polyp of vocal cord and larynx: Secondary | ICD-10-CM | POA: Insufficient documentation

## 2021-07-09 HISTORY — PX: MICROLARYNGOSCOPY: SHX5208

## 2021-07-09 LAB — POCT PREGNANCY, URINE: Preg Test, Ur: NEGATIVE

## 2021-07-09 SURGERY — MICROLARYNGOSCOPY
Anesthesia: General | Site: Throat | Laterality: Right

## 2021-07-09 MED ORDER — HYDROCODONE-ACETAMINOPHEN 5-325 MG PO TABS: 1.0000 | ORAL_TABLET | Freq: Four times a day (QID) | ORAL | 0 refills | Status: AC | PRN

## 2021-07-09 MED ORDER — HYDROMORPHONE HCL 1 MG/ML IJ SOLN: 0.2500 mg | INTRAMUSCULAR | Status: DC | PRN

## 2021-07-09 MED ORDER — MEPERIDINE HCL 25 MG/ML IJ SOLN: 6.2500 mg | INTRAMUSCULAR | Status: DC | PRN

## 2021-07-09 MED ORDER — LACTATED RINGERS IV SOLN: INTRAVENOUS | Status: DC

## 2021-07-09 MED ORDER — FENTANYL CITRATE (PF) 100 MCG/2ML IJ SOLN
INTRAMUSCULAR | Status: DC | PRN
  Administered 2021-07-09: 100 ug via INTRAVENOUS

## 2021-07-09 MED ORDER — PHENYLEPHRINE HCL 0.5 % NA SOLN
NASAL | Status: DC | PRN
  Administered 2021-07-09: 30 mL via TOPICAL

## 2021-07-09 MED ORDER — GLYCOPYRROLATE 0.2 MG/ML IJ SOLN
INTRAMUSCULAR | Status: DC | PRN
  Administered 2021-07-09: .2 mg via INTRAVENOUS

## 2021-07-09 MED ORDER — ONDANSETRON HCL 4 MG/2ML IJ SOLN
INTRAMUSCULAR | Status: DC | PRN
  Administered 2021-07-09: 4 mg via INTRAVENOUS

## 2021-07-09 MED ORDER — PROPOFOL 10 MG/ML IV BOLUS
INTRAVENOUS | Status: DC | PRN
  Administered 2021-07-09 (×2): 100 mg via INTRAVENOUS

## 2021-07-09 MED ORDER — ACETAMINOPHEN 10 MG/ML IV SOLN
INTRAVENOUS | Status: DC | PRN
  Administered 2021-07-09: 1000 mg via INTRAVENOUS

## 2021-07-09 MED ORDER — OXYCODONE HCL 5 MG/5ML PO SOLN: 5.0000 mg | Freq: Once | ORAL | Status: DC | PRN

## 2021-07-09 MED ORDER — SUCCINYLCHOLINE CHLORIDE 200 MG/10ML IV SOSY
PREFILLED_SYRINGE | INTRAVENOUS | Status: DC | PRN
  Administered 2021-07-09: 100 mg via INTRAVENOUS

## 2021-07-09 MED ORDER — LIDOCAINE HCL (CARDIAC) PF 100 MG/5ML IV SOSY
PREFILLED_SYRINGE | INTRAVENOUS | Status: DC | PRN
  Administered 2021-07-09: 40 mg via INTRAVENOUS

## 2021-07-09 MED ORDER — MIDAZOLAM HCL 5 MG/5ML IJ SOLN
INTRAMUSCULAR | Status: DC | PRN
  Administered 2021-07-09: 2 mg via INTRAVENOUS

## 2021-07-09 MED ORDER — OXYCODONE HCL 5 MG PO TABS: 5.0000 mg | ORAL_TABLET | Freq: Once | ORAL | Status: DC | PRN

## 2021-07-09 MED ORDER — DEXAMETHASONE SODIUM PHOSPHATE 4 MG/ML IJ SOLN
INTRAMUSCULAR | Status: DC | PRN
  Administered 2021-07-09: 8 mg via INTRAVENOUS

## 2021-07-09 MED ORDER — PROMETHAZINE HCL 25 MG/ML IJ SOLN: 6.2500 mg | INTRAMUSCULAR | Status: DC | PRN

## 2021-07-09 SURGICAL SUPPLY — 19 items
BASIN GRAD PLASTIC 32OZ STRL (MISCELLANEOUS) IMPLANT
BLOCK BITE GUARD (MISCELLANEOUS) ×2 IMPLANT
COVER MAYO STAND STRL (DRAPES) ×2 IMPLANT
COVER TABLE BACK 60X90 (DRAPES) ×2 IMPLANT
CUP MEDICINE 2OZ PLAST GRAD ST (MISCELLANEOUS) ×1 IMPLANT
DRAPE SHEET LG 3/4 BI-LAMINATE (DRAPES) ×2 IMPLANT
DRSG TELFA 4X3 1S NADH ST (GAUZE/BANDAGES/DRESSINGS) ×2 IMPLANT
GLOVE SURG GAMMEX PI TX LF 7.5 (GLOVE) ×3 IMPLANT
KIT TURNOVER KIT A (KITS) ×2 IMPLANT
MARKER SKIN DUAL TIP RULER LAB (MISCELLANEOUS) ×1 IMPLANT
NDL 18GX1X1/2 (RX/OR ONLY) (NEEDLE) IMPLANT
NEEDLE 18GX1X1/2 (RX/OR ONLY) (NEEDLE) IMPLANT
NS IRRIG 500ML POUR BTL (IV SOLUTION) IMPLANT
PATTIES SURGICAL .5 X.5 (GAUZE/BANDAGES/DRESSINGS) ×2 IMPLANT
SPONGE XRAY 4X4 16PLY STRL (MISCELLANEOUS) ×2 IMPLANT
STRAP BODY AND KNEE 60X3 (MISCELLANEOUS) ×2 IMPLANT
TOWEL OR 17X26 4PK STRL BLUE (TOWEL DISPOSABLE) ×2 IMPLANT
TUBING CONN 6MMX3.1M (TUBING) ×1
TUBING SUCTION CONN 0.25 STRL (TUBING) ×1 IMPLANT

## 2021-07-09 NOTE — Op Note (Signed)
07/09/2021  9:11 AM    Lacey Mccarty  [id number]   Pre-Op Dx: Right vocal cord polyp  Post-op Dx: Right vocal cord polyp  Proc: Direct microlaryngoscopy with excision right vocal cord polyp  Surg:  Lacey Mccarty  Anes:  GOT  EBL: 5 mL  Comp: None  Findings: Large watery polyp was on the right vocal cord.  This was torn as the patient was intubated so that it was somewhat deflated.  It was attached to the right mid cord with excessive mucous membrane that was stretched overlying the muscles of the true cord.  There was no evidence for polyp on the left cord.  Procedure: The patient was brought to the operating room and placed in the supine position.  She was given general anesthesia by oral endotracheal intubation using a small oro-endotracheal tube.  Once patient was fully asleep the oropharynx was visualized.  She had a full set of teeth that were in good repair.  An upper tooth guard was placed.  A Hollinger anterior laryngoscope was used to visualize the hypopharynx and laryngeal area.  As I placed the scope down there was some clotted blood around the tube at the laryngeal inlet.  This was suctioned away.  The scope was placed into the laryngeal inlet to be able to see the polyp on the left side that appeared to be traumatized.  A Lewy arm was placed onto the scope to stabilize this to be able to work through this.  A Hopkins rod was placed through the scope to be able to see the larynx magnified.  The polyp could be seen on the right vocal cord that had been torn and had blood some.  A picture was taken of the polyp.  Using the magnification and a micro up-biting forceps the polyp was stripped from the right cord.  Most the mucosa came from the dorsal surface of the cord but there was some around the medial inferior surface of the cord.  The muscle underlying this was intact although slightly bruised.  There was slight bleeding coming from the mid posterior cord muscle and a cotton  pledgets soaked in ephedrine was used for vasoconstriction.  This stopped the bleeding and the vocal cord could be examined well.  There is a little more excess mucous membrane that was trimmed on the dorsal surface of the cord and some along the anterior medial surface.  The cord appeared to be now the same size and shape as the left true cord that did not have a polyp or any swelling.  A picture was taken of this now post completion of removal of the polyp.  The patient tolerated the procedure well.  The scope was removed and there were no other lesions noted.  She was awakened and taken to the recovery room in satisfactory condition.  There were no operative complications.  Dispo:   To PACU to be discharged home.  Plan: To follow-up in the office in 1 week to make sure she is doing well.  She will rest her voice.  We will then plan to see her again in about 1 month to make sure that the vocal cord is healed well and her voice is back to normal.  I have given her some Tylenol with codeine that she can use if she has significant cough or pain.  Lacey Mccarty  07/09/2021 9:11 AM

## 2021-07-09 NOTE — Transfer of Care (Signed)
Immediate Anesthesia Transfer of Care Note  Patient: Lacey Mccarty  Procedure(s) Performed: DIRECT MICROLARYNGOSCOPY WITH EXCISION VOCAL CORD POLYP (Right: Throat)  Patient Location: PACU  Anesthesia Type: General  Level of Consciousness: awake, alert  and patient cooperative  Airway and Oxygen Therapy: Patient Spontanous Breathing and Patient connected to supplemental oxygen  Post-op Assessment: Post-op Vital signs reviewed, Patient's Cardiovascular Status Stable, Respiratory Function Stable, Patent Airway and No signs of Nausea or vomiting  Post-op Vital Signs: Reviewed and stable  Complications: No notable events documented.

## 2021-07-09 NOTE — H&P (Signed)
H&P has been reviewed and patient reevaluated, no changes necessary. To be downloaded later.  

## 2021-07-09 NOTE — Anesthesia Preprocedure Evaluation (Signed)
Anesthesia Evaluation  Patient identified by MRN, date of birth, ID band Patient awake    Reviewed: Allergy & Precautions, NPO status , Patient's Chart, lab work & pertinent test results, reviewed documented beta blocker date and time   History of Anesthesia Complications Negative for: history of anesthetic complications  Airway Mallampati: III  TM Distance: >3 FB Neck ROM: Full    Dental   Pulmonary asthma , Current Smoker and Patient abstained from smoking.,    breath sounds clear to auscultation       Cardiovascular (-) angina(-) DOE  Rhythm:Regular Rate:Normal     Neuro/Psych    GI/Hepatic neg GERD  ,  Endo/Other  Hypothyroidism   Renal/GU      Musculoskeletal  (+) Arthritis ,   Abdominal (+) + obese (BMI 34),   Peds  Hematology  (+) Blood dyscrasia, anemia ,   Anesthesia Other Findings Laryngeal mass  Reproductive/Obstetrics                             Anesthesia Physical Anesthesia Plan  ASA: 2  Anesthesia Plan: General   Post-op Pain Management:    Induction: Intravenous  PONV Risk Score and Plan: 3 and Treatment may vary due to age or medical condition and Ondansetron  Airway Management Planned: Oral ETT  Additional Equipment:   Intra-op Plan:   Post-operative Plan: Extubation in OR  Informed Consent: I have reviewed the patients History and Physical, chart, labs and discussed the procedure including the risks, benefits and alternatives for the proposed anesthesia with the patient or authorized representative who has indicated his/her understanding and acceptance.       Plan Discussed with: CRNA and Anesthesiologist  Anesthesia Plan Comments:         Anesthesia Quick Evaluation

## 2021-07-09 NOTE — Anesthesia Procedure Notes (Signed)
Procedure Name: Intubation Date/Time: 07/09/2021 8:39 AM Performed by: Silvana Newness, CRNA Pre-anesthesia Checklist: Patient identified, Emergency Drugs available, Suction available, Patient being monitored and Timeout performed Patient Re-evaluated:Patient Re-evaluated prior to induction Oxygen Delivery Method: Circle system utilized Preoxygenation: Pre-oxygenation with 100% oxygen Induction Type: IV induction Ventilation: Mask ventilation without difficulty Laryngoscope Size: Mac and 3 Grade View: Grade I Tube type: Oral Rae Tube size: 6.5 mm Number of attempts: 1 Airway Equipment and Method: Stylet Placement Confirmation: ETT inserted through vocal cords under direct vision, positive ETCO2 and breath sounds checked- equal and bilateral Tube secured with: Tape Dental Injury: Teeth and Oropharynx as per pre-operative assessment

## 2021-07-09 NOTE — Anesthesia Postprocedure Evaluation (Signed)
Anesthesia Post Note  Patient: Lacey Mccarty  Procedure(s) Performed: DIRECT MICROLARYNGOSCOPY WITH EXCISION VOCAL CORD POLYP (Right: Throat)     Patient location during evaluation: PACU Anesthesia Type: General Level of consciousness: awake and alert Pain management: pain level controlled Vital Signs Assessment: post-procedure vital signs reviewed and stable Respiratory status: spontaneous breathing, nonlabored ventilation, respiratory function stable and patient connected to nasal cannula oxygen Cardiovascular status: blood pressure returned to baseline and stable Postop Assessment: no apparent nausea or vomiting Anesthetic complications: no   No notable events documented.  Queenie Aufiero A  Maymie Brunke

## 2021-07-10 ENCOUNTER — Encounter: Payer: Self-pay | Admitting: Otolaryngology

## 2021-07-13 LAB — SURGICAL PATHOLOGY

## 2021-08-16 DIAGNOSIS — E669 Obesity, unspecified: Secondary | ICD-10-CM | POA: Insufficient documentation

## 2021-08-16 DIAGNOSIS — E7800 Pure hypercholesterolemia, unspecified: Secondary | ICD-10-CM | POA: Insufficient documentation

## 2021-08-19 NOTE — Telephone Encounter (Signed)
Error

## 2021-11-23 ENCOUNTER — Ambulatory Visit: Payer: Self-pay | Admitting: Nurse Practitioner

## 2021-11-24 DIAGNOSIS — E0590 Thyrotoxicosis, unspecified without thyrotoxic crisis or storm: Secondary | ICD-10-CM | POA: Insufficient documentation

## 2021-12-17 ENCOUNTER — Ambulatory Visit: Payer: Self-pay | Admitting: Nurse Practitioner

## 2022-01-19 ENCOUNTER — Other Ambulatory Visit: Payer: Self-pay | Admitting: Nurse Practitioner

## 2022-01-19 DIAGNOSIS — E559 Vitamin D deficiency, unspecified: Secondary | ICD-10-CM

## 2022-04-28 ENCOUNTER — Encounter: Payer: Self-pay | Admitting: Nurse Practitioner

## 2022-04-28 ENCOUNTER — Ambulatory Visit (INDEPENDENT_AMBULATORY_CARE_PROVIDER_SITE_OTHER): Payer: Commercial Managed Care - HMO | Admitting: Nurse Practitioner

## 2022-04-28 VITALS — BP 138/85 | HR 70 | Temp 97.3°F | Ht 64.0 in | Wt 211.4 lb

## 2022-04-28 DIAGNOSIS — D500 Iron deficiency anemia secondary to blood loss (chronic): Secondary | ICD-10-CM

## 2022-04-28 DIAGNOSIS — Z124 Encounter for screening for malignant neoplasm of cervix: Secondary | ICD-10-CM

## 2022-04-28 DIAGNOSIS — R52 Pain, unspecified: Secondary | ICD-10-CM

## 2022-04-28 DIAGNOSIS — Z1211 Encounter for screening for malignant neoplasm of colon: Secondary | ICD-10-CM

## 2022-04-28 DIAGNOSIS — E559 Vitamin D deficiency, unspecified: Secondary | ICD-10-CM

## 2022-04-28 DIAGNOSIS — Z13220 Encounter for screening for lipoid disorders: Secondary | ICD-10-CM

## 2022-04-28 DIAGNOSIS — E890 Postprocedural hypothyroidism: Secondary | ICD-10-CM

## 2022-04-28 DIAGNOSIS — Z1231 Encounter for screening mammogram for malignant neoplasm of breast: Secondary | ICD-10-CM

## 2022-04-28 MED ORDER — MELOXICAM 7.5 MG PO TABS: 7.5000 mg | ORAL_TABLET | Freq: Every day | ORAL | 0 refills | Status: DC

## 2022-04-28 MED ORDER — LEVOTHYROXINE SODIUM 175 MCG PO TABS: 175.0000 ug | ORAL_TABLET | Freq: Every day | ORAL | 0 refills | Status: DC

## 2022-04-28 NOTE — Progress Notes (Signed)
@Patient  ID: Lacey Mccarty, female    DOB: 1975-04-09, 47 y.o.   MRN: [id number]  Chief Complaint  Patient presents with   Follow-up    Referring provider: No ref. provider found   HPI  Lacey Mccarty 47 y.o. female  has a past medical history of Anemia (05/2018), Asthma, Chronic back pain, Dysmenorrhea (05/2020), Graves disease, Hypothyroidism, Mediastinal mass, Menorrhagia with regular cycle (05/2020), and Thymus hyperplasia ([place]) (03/2012). To the [facility] for follow up on hypothyroidism.   Patient presents today for a follow-up visit.  She has been lost to follow-up since last April.  She has been following with endocrinology with [facility].  She is currently on 175 mcg of Synthroid daily.  She is asking for a refill on this today.  We will give her a 30-day refill and she does need to make a follow-up appointment with [facility].  She will need to continue to follow with them for thyroid management.  Patient has been having chronic pain for quite some time.  She has been to pain management and was started on Lyrica but did not like the way it made her feel.  We will trial Mobic today. Denies f/c/s, n/v/d, hemoptysis, PND, leg swelling Denies chest pain or edema    Allergies  Allergen Reactions   Tapazole [Methimazole] Hives and Shortness Of Breath    Immunization History  Administered Date(s) Administered   Moderna SARS-COV2 Booster Vaccination 05/26/2020   Moderna Sars-Covid-2 Vaccination 04/19/2019, 05/22/2019   PPD Test 06/15/2016   Tdap 10/14/2013    Past Medical History:  Diagnosis Date   Anemia 05/2018   Asthma    Chronic back pain    Dysmenorrhea 05/2020   Graves disease    I-131 ablation late 07/2012   Hypothyroidism    Mediastinal mass    Menorrhagia with regular cycle 05/2020   Thymus hyperplasia ([place]) 03/2012    Tobacco History: Social History   Tobacco Use  Smoking Status Some Days   Packs/day: 0.25   Years: 15.00   Additional pack years: 0.00    Total pack years: 3.75   Types: Cigarettes  Smokeless Tobacco Never  Tobacco Comments   Started smoking 2007   Ready to quit: Not Answered Counseling given: Not Answered Tobacco comments: Started smoking 2007   Outpatient Encounter Medications as of 04/28/2022  Medication Sig   albuterol (VENTOLIN HFA) 108 (90 Base) MCG/ACT inhaler Inhale 1-2 puffs into the lungs every 6 (six) hours as needed for wheezing or shortness of breath.   loratadine (CLARITIN) 10 MG tablet Take 1 tablet by mouth daily.   meloxicam (MOBIC) 7.5 MG tablet Take 1 tablet (7.5 mg total) by mouth daily.   Vitamin D, Ergocalciferol, (DRISDOL) 1.25 MG (50000 UNIT) CAPS capsule Take 50,000 Units by mouth once a week.   [DISCONTINUED] levothyroxine (SYNTHROID) 175 MCG tablet Take by mouth.   levothyroxine (SYNTHROID) 175 MCG tablet Take 1 tablet (175 mcg total) by mouth daily before breakfast.   levothyroxine (SYNTHROID) 50 MCG tablet Take 3 tablets (150 mcg total) by mouth daily.   [DISCONTINUED] pregabalin (LYRICA) 100 MG capsule Take 1 capsule (100 mg total) by mouth 3 (three) times daily. (Patient not taking: Reported on 04/28/2022)   No facility-administered encounter medications on file as of 04/28/2022.     Review of Systems  Review of Systems  Constitutional: Negative.   HENT: Negative.    Cardiovascular: Negative.   Gastrointestinal: Negative.   Allergic/Immunologic: Negative.   Neurological: Negative.  Psychiatric/Behavioral: Negative.         Physical Exam  BP 138/85   Pulse 70   Temp (!) 97.3 F (36.3 C)   Ht 5\' 4"  (1.626 m)   Wt 211 lb 6.4 oz (95.9 kg)   LMP 04/28/2022 (Approximate)   SpO2 100%   BMI 36.29 kg/m   Wt Readings from Last 5 Encounters:  04/28/22 211 lb 6.4 oz (95.9 kg)  07/09/21 197 lb (89.4 kg)  07/03/21 195 lb 3.2 oz (88.5 kg)  05/25/21 197 lb 6.4 oz (89.5 kg)  05/21/21 192 lb 3.2 oz (87.2 kg)     Physical Exam Vitals and nursing note reviewed.  Constitutional:       General: She is not in acute distress.    Appearance: She is well-developed.  Cardiovascular:     Rate and Rhythm: Normal rate and regular rhythm.  Pulmonary:     Effort: Pulmonary effort is normal.     Breath sounds: Normal breath sounds.  Neurological:     Mental Status: She is alert and oriented to person, place, and time.      Assessment & Plan:   Encounter for screening mammogram for malignant neoplasm of breast - MM Digital Screening  2. Colon cancer screening  - Ambulatory referral to Gastroenterology  3. Cervical cancer screening  - Ambulatory referral to Obstetrics / Gynecology  4. Lipid screening  - Lipid Panel  5. Iron deficiency anemia due to chronic blood loss  - CBC - Comprehensive metabolic panel  6. Hypothyroidism following radioiodine therapy  - CBC - Comprehensive metabolic panel - Thyroid Panel With TSH  -will need to schedule follow up endocrinology    Follow up:  Follow up in 6 months     Fenton Foy, NP 04/28/2022

## 2022-04-28 NOTE — Patient Instructions (Addendum)
1. Encounter for screening mammogram for malignant neoplasm of breast  - MM Digital Screening  2. Colon cancer screening  - Ambulatory referral to Gastroenterology  3. Cervical cancer screening  - Ambulatory referral to Obstetrics / Gynecology  4. Lipid screening  - Lipid Panel  5. Iron deficiency anemia due to chronic blood loss  - CBC - Comprehensive metabolic panel  6. Hypothyroidism following radioiodine therapy  - CBC - Comprehensive metabolic panel - Thyroid Panel With TSH  -will need to schedule follow up endocrinology    Follow up:  Follow up in

## 2022-04-28 NOTE — Assessment & Plan Note (Signed)
-   MM Digital Screening  2. Colon cancer screening  - Ambulatory referral to Gastroenterology  3. Cervical cancer screening  - Ambulatory referral to Obstetrics / Gynecology  4. Lipid screening  - Lipid Panel  5. Iron deficiency anemia due to chronic blood loss  - CBC - Comprehensive metabolic panel  6. Hypothyroidism following radioiodine therapy  - CBC - Comprehensive metabolic panel - Thyroid Panel With TSH  -will need to schedule follow up endocrinology    Follow up:  Follow up in

## 2022-04-29 LAB — COMPREHENSIVE METABOLIC PANEL
ALT: 9 IU/L (ref 0–32)
AST: 15 IU/L (ref 0–40)
Albumin/Globulin Ratio: 1.4 (ref 1.2–2.2)
Albumin: 4.1 g/dL (ref 3.9–4.9)
Alkaline Phosphatase: 65 IU/L (ref 44–121)
BUN/Creatinine Ratio: 15 (ref 9–23)
BUN: 13 mg/dL (ref 6–24)
Bilirubin Total: 0.5 mg/dL (ref 0.0–1.2)
CO2: 24 mmol/L (ref 20–29)
Calcium: 8.8 mg/dL (ref 8.7–10.2)
Chloride: 104 mmol/L (ref 96–106)
Creatinine, Ser: 0.87 mg/dL (ref 0.57–1.00)
Globulin, Total: 2.9 g/dL (ref 1.5–4.5)
Glucose: 79 mg/dL (ref 70–99)
Potassium: 3.9 mmol/L (ref 3.5–5.2)
Sodium: 140 mmol/L (ref 134–144)
Total Protein: 7 g/dL (ref 6.0–8.5)
eGFR: 82 mL/min/{1.73_m2} (ref >59)

## 2022-04-29 LAB — LIPID PANEL
Chol/HDL Ratio: 3.8 ratio (ref 0.0–4.4)
Cholesterol, Total: 234 mg/dL — ABNORMAL HIGH (ref 100–199)
HDL: 62 mg/dL (ref >39)
LDL Chol Calc (NIH): 159 mg/dL — ABNORMAL HIGH (ref 0–99)
Triglycerides: 76 mg/dL (ref 0–149)
VLDL Cholesterol Cal: 13 mg/dL (ref 5–40)

## 2022-04-29 LAB — THYROID PANEL WITH TSH
Free Thyroxine Index: 0.2 — ABNORMAL LOW (ref 1.2–4.9)
T3 Uptake Ratio: 16 % — ABNORMAL LOW (ref 24–39)
T4, Total: 1.1 ug/dL — CL (ref 4.5–12.0)
TSH: 78 u[IU]/mL — ABNORMAL HIGH (ref 0.450–4.500)

## 2022-04-29 LAB — CBC
Hematocrit: 31.7 % — ABNORMAL LOW (ref 34.0–46.6)
Hemoglobin: 9.9 g/dL — ABNORMAL LOW (ref 11.1–15.9)
MCH: 27.7 pg (ref 26.6–33.0)
MCHC: 31.2 g/dL — ABNORMAL LOW (ref 31.5–35.7)
MCV: 89 fL (ref 79–97)
Platelets: 264 10*3/uL (ref 150–450)
RBC: 3.57 x10E6/uL — ABNORMAL LOW (ref 3.77–5.28)
RDW: 15.8 % — ABNORMAL HIGH (ref 11.7–15.4)
WBC: 3.5 10*3/uL (ref 3.4–10.8)

## 2022-04-29 LAB — VITAMIN D 25 HYDROXY (VIT D DEFICIENCY, FRACTURES): Vit D, 25-Hydroxy: 19.9 ng/mL — ABNORMAL LOW (ref 30.0–100.0)

## 2022-05-10 ENCOUNTER — Emergency Department (HOSPITAL_BASED_OUTPATIENT_CLINIC_OR_DEPARTMENT_OTHER): Payer: Medicaid Other

## 2022-05-10 ENCOUNTER — Encounter (HOSPITAL_BASED_OUTPATIENT_CLINIC_OR_DEPARTMENT_OTHER): Payer: Self-pay | Admitting: Emergency Medicine

## 2022-05-10 ENCOUNTER — Emergency Department (HOSPITAL_BASED_OUTPATIENT_CLINIC_OR_DEPARTMENT_OTHER)
Admission: EM | Admit: 2022-05-10 | Discharge: 2022-05-10 | Disposition: A | Discharge status: Home / Self Care | Payer: Medicaid Other | Attending: Emergency Medicine | Admitting: Emergency Medicine

## 2022-05-10 DIAGNOSIS — D509 Iron deficiency anemia, unspecified: Secondary | ICD-10-CM

## 2022-05-10 DIAGNOSIS — N939 Abnormal uterine and vaginal bleeding, unspecified: Secondary | ICD-10-CM

## 2022-05-10 LAB — CBC WITH DIFFERENTIAL/PLATELET
Abs Immature Granulocytes: 0.01 10*3/uL (ref 0.00–0.07)
Basophils Absolute: 0 10*3/uL (ref 0.0–0.1)
Basophils Relative: 1 %
Eosinophils Absolute: 0 10*3/uL (ref 0.0–0.5)
Eosinophils Relative: 1 %
HCT: 28.7 % — ABNORMAL LOW (ref 36.0–46.0)
Hemoglobin: 9.3 g/dL — ABNORMAL LOW (ref 12.0–15.0)
Immature Granulocytes: 0 %
Lymphocytes Relative: 32 %
Lymphs Abs: 1.2 10*3/uL (ref 0.7–4.0)
MCH: 28.4 pg (ref 26.0–34.0)
MCHC: 32.4 g/dL (ref 30.0–36.0)
MCV: 87.8 fL (ref 80.0–100.0)
Monocytes Absolute: 0.3 10*3/uL (ref 0.1–1.0)
Monocytes Relative: 8 %
Neutro Abs: 2.1 10*3/uL (ref 1.7–7.7)
Neutrophils Relative %: 58 %
Platelets: 276 10*3/uL (ref 150–400)
RBC: 3.27 MIL/uL — ABNORMAL LOW (ref 3.87–5.11)
RDW: 16.7 % — ABNORMAL HIGH (ref 11.5–15.5)
WBC: 3.6 10*3/uL — ABNORMAL LOW (ref 4.0–10.5)
nRBC: 0 % (ref 0.0–0.2)

## 2022-05-10 LAB — BASIC METABOLIC PANEL
Anion gap: 9 (ref 5–15)
BUN: 19 mg/dL (ref 6–20)
CO2: 26 mmol/L (ref 22–32)
Calcium: 9.2 mg/dL (ref 8.9–10.3)
Chloride: 101 mmol/L (ref 98–111)
Creatinine, Ser: 1 mg/dL (ref 0.44–1.00)
GFR, Estimated: >60 mL/min (ref >60)
Glucose, Bld: 91 mg/dL (ref 70–99)
Potassium: 3.9 mmol/L (ref 3.5–5.1)
Sodium: 136 mmol/L (ref 135–145)

## 2022-05-10 LAB — HCG, SERUM, QUALITATIVE: Preg, Serum: NEGATIVE

## 2022-05-10 MED ORDER — MEGESTROL ACETATE 40 MG PO TABS: 40.0000 mg | ORAL_TABLET | Freq: Two times a day (BID) | ORAL | 0 refills | Status: DC

## 2022-05-10 NOTE — ED Provider Notes (Signed)
[facility] Provider Note   CSN: [id number] Arrival date & time: 05/10/22  1242     History  Chief Complaint  Patient presents with   Vaginal Bleeding    Lacey Mccarty is a 47 y.o. female.  Patient has a past medical history of anemia.  Patient has had heavy periods in the past but she has never been this irregular  The history is provided by the patient. No language interpreter was used.  Vaginal Bleeding Quality:  Bright red Severity:  Moderate Onset quality:  Gradual Duration:  10 days Timing:  Constant Progression:  Worsening Chronicity:  New Menstrual history:  Regular Possible pregnancy: no   Relieved by:  Nothing Worsened by:  Nothing Ineffective treatments:  None tried Risk factors: no new sexual partner and no STD        Home Medications Prior to Admission medications   Medication Sig Start Date End Date Taking? Authorizing Provider  megestrol (MEGACE) 40 MG tablet Take 1 tablet (40 mg total) by mouth 2 (two) times daily. 05/10/22  Yes Cheron Schaumann K, PA-C  albuterol (VENTOLIN HFA) 108 (90 Base) MCG/ACT inhaler Inhale 1-2 puffs into the lungs every 6 (six) hours as needed for wheezing or shortness of breath. 05/18/21   Haskel Schroeder, PA-C  levothyroxine (SYNTHROID) 175 MCG tablet Take 1 tablet (175 mcg total) by mouth daily before breakfast. 04/28/22   Ivonne Andrew, NP  levothyroxine (SYNTHROID) 50 MCG tablet Take 3 tablets (150 mcg total) by mouth daily. 06/19/21 08/18/21  Orion Crook I, NP  loratadine (CLARITIN) 10 MG tablet Take 1 tablet by mouth daily.    [provider]  meloxicam (MOBIC) 7.5 MG tablet Take 1 tablet (7.5 mg total) by mouth daily. 04/28/22   Ivonne Andrew, NP  Vitamin D, Ergocalciferol, (DRISDOL) 1.25 MG (50000 UNIT) CAPS capsule Take 50,000 Units by mouth once a week. 12/28/21   [provider]      Allergies    Tapazole [methimazole]    Review of Systems    Review of Systems  Genitourinary:  Positive for vaginal bleeding.  All other systems reviewed and are negative.   Physical Exam Updated Vital Signs BP 123/83 (BP Location: Left Arm)   Pulse 87   Temp 98.5 F (36.9 C) (Oral)   Resp 18   Ht 5\' 3"  (1.6 m)   Wt 93.9 kg   LMP 04/28/2022 (Approximate)   SpO2 99%   BMI 36.67 kg/m  Physical Exam Vitals and nursing note reviewed.  Constitutional:      Appearance: She is well-developed.  HENT:     Head: Normocephalic.  Cardiovascular:     Rate and Rhythm: Normal rate.  Pulmonary:     Effort: Pulmonary effort is normal.  Abdominal:     General: There is no distension.  Musculoskeletal:        General: Normal range of motion.     Cervical back: Normal range of motion.  Skin:    General: Skin is warm.  Neurological:     General: No focal deficit present.     Mental Status: She is alert and oriented to person, place, and time.    ED Results / Procedures / Treatments   Labs (all labs ordered are listed, but only abnormal results are displayed) Labs Reviewed  CBC WITH DIFFERENTIAL/PLATELET - Abnormal; Notable for the following components:      Result Value   WBC 3.6 (*)  RBC 3.27 (*)    Hemoglobin 9.3 (*)    HCT 28.7 (*)    RDW 16.7 (*)    All other components within normal limits  BASIC METABOLIC PANEL  HCG, SERUM, QUALITATIVE    EKG None  Radiology US PELVIC COMPLETE W TRANSVAGINAL AND TORSION R/O  Result Date: 05/10/2022 CLINICAL DATA:  Pain.  Vaginal bleeding for 3 weeks EXAM: TRANSABDOMINAL AND TRANSVAGINAL ULTRASOUND OF PELVIS DOPPLER ULTRASOUND OF OVARIES TECHNIQUE: Both transabdominal and transvaginal ultrasound examinations of the pelvis were performed. Transabdominal technique was performed for global imaging of the pelvis including uterus, ovaries, adnexal regions, and pelvic cul-de-sac. It was necessary to proceed with endovaginal exam following the transabdominal exam to visualize the uterus and ovaries.  Color and duplex Doppler ultrasound was utilized to evaluate blood flow to the ovaries. COMPARISON:  None Available. FINDINGS: Uterus Measurements: 10.2 x 6.4 x 6.8 = volume: 230 mL. Lobular heterogeneous myometrium identified consistent with multiple fibroids. Examples include fundal area on the left measuring 18 x 16 x 12 mm and right 10 x 10 x 12 mm. Focus towards the lower uterine segment measures 2.5 x 1.6 x 2.6 cm. Endometrium Thickness: 10 mm.  No focal abnormality visualized. Right ovary Poorly seen.  No separate adnexal mass. Left ovary What is labeled as left ovary measures 19 x 13 x 19 mm. This has not demonstrate follicles and is difficult to confirm as the left ovary. No separate left adnexal mass. This does have blood flow on Doppler. Other findings No abnormal free fluid. Study is limited by overlapping bowel gas and soft tissue. Overall if there is further concern additional cross-sectional study can be performed as clinically appropriate IMPRESSION: Limited examination as above. Lobular uterus with multiple fibroids. Poor visualization of the ovaries.  No free fluid. Electronically Signed   By: Karen Kays M.D.   On: 05/10/2022 15:01    Procedures Procedures    Medications Ordered in ED Medications - No data to display  ED Course/ Medical Decision Making/ A&P                             Medical Decision Making Patient planes of heavy vaginal bleeding.  Patient reports that she has had heavy bleeding in the past but she has never had bleeding for this long.  Patient reports she has a history of anemia  Amount and/or Complexity of Data Reviewed External Data Reviewed: notes.    Details: Primary care notes reviewed Labs: ordered. Decision-making details documented in ED Course.    Details: Labs ordered reviewed and interpreted patient's hemoglobin is 9.3.  Hemoglobin over the past year has been 9.8-10 Radiology: ordered. Discussion of management or test interpretation with  external provider(s): Discussed with the MAUprovider on call who advised Megace.  Counseled on fibroids and the need for follow-up.  Risk Prescription drug management.           Final Clinical Impression(s) / ED Diagnoses Final diagnoses:  Vaginal bleeding    Rx / DC Orders ED Discharge Orders          Ordered    megestrol (MEGACE) 40 MG tablet  2 times daily        05/10/22 1546          An After Visit Summary was printed and given to the patient.     Elson Areas, [place] 05/10/22 1718    Terald Sleeper, MD 05/11/22 [phone]

## 2022-05-10 NOTE — ED Triage Notes (Signed)
Patient presents C/O vaginal bleeding X2 weeks. States her periods are normally regular and last only 5 days. Denies pain.

## 2022-05-11 ENCOUNTER — Telehealth: Payer: Self-pay | Admitting: General Practice

## 2022-05-11 NOTE — Telephone Encounter (Signed)
Pt said that we were suppose to help her w/ a GYN referral just FYI  Please call her

## 2022-05-19 ENCOUNTER — Encounter: Payer: Commercial Managed Care - HMO | Admitting: Family Medicine

## 2022-06-14 ENCOUNTER — Ambulatory Visit (AMBULATORY_SURGERY_CENTER): Payer: Commercial Managed Care - HMO

## 2022-06-14 VITALS — Ht 64.0 in | Wt 204.0 lb

## 2022-06-14 DIAGNOSIS — Z1211 Encounter for screening for malignant neoplasm of colon: Secondary | ICD-10-CM

## 2022-06-14 MED ORDER — NA SULFATE-K SULFATE-MG SULF 17.5-3.13-1.6 GM/177ML PO SOLN: 1.0000 | Freq: Once | ORAL | 0 refills | Status: AC

## 2022-06-14 NOTE — Progress Notes (Signed)
No egg or soy allergy known to patient  No issues known to pt with past sedation with any surgeries or procedures Patient denies ever being told they had issues or difficulty with intubation  No FH of Malignant Hyperthermia Pt is not on diet pills Pt is not on  home 02  Pt is not on blood thinners  Pt denies issues with constipation  No A fib or A flutter Have any cardiac testing pending--no  Pt denies any mobility issues   Pt instructed to use Singlecare.com or GoodRx for a price reduction on prep    Patient's chart reviewed by Cathlyn Parsons CNRA prior to previsit and patient appropriate for the [facility].  Previsit completed and red dot placed by patient's name on their procedure day (on provider's schedule).    PV complete. Prep reviewed with patient. Instructions sent via mychart and to mailing address. Rx sent to [facility] [address] in [place].

## 2022-06-17 ENCOUNTER — Ambulatory Visit: Payer: Commercial Managed Care - HMO

## 2022-06-18 ENCOUNTER — Encounter: Payer: Self-pay | Admitting: Family Medicine

## 2022-06-18 ENCOUNTER — Ambulatory Visit (INDEPENDENT_AMBULATORY_CARE_PROVIDER_SITE_OTHER): Payer: Medicaid Other | Admitting: Family Medicine

## 2022-06-18 VITALS — BP 117/78 | HR 75 | Ht 63.0 in

## 2022-06-18 DIAGNOSIS — N920 Excessive and frequent menstruation with regular cycle: Secondary | ICD-10-CM

## 2022-06-18 DIAGNOSIS — N946 Dysmenorrhea, unspecified: Secondary | ICD-10-CM | POA: Insufficient documentation

## 2022-06-18 MED ORDER — DICLOFENAC SODIUM 75 MG PO TBEC: 75.0000 mg | DELAYED_RELEASE_TABLET | Freq: Two times a day (BID) | ORAL | 2 refills | Status: DC

## 2022-06-18 MED ORDER — MEGESTROL ACETATE 40 MG PO TABS: 40.0000 mg | ORAL_TABLET | Freq: Two times a day (BID) | ORAL | 1 refills | Status: DC

## 2022-06-18 NOTE — Progress Notes (Signed)
Was seen in ED on 05-10-22. Hx of heavy irregular periods. Patient states she was given megace to help with bleeding and it has helped some but as soon as she stops megace bleeding starts up again. Armandina Stammer RN

## 2022-06-18 NOTE — Assessment & Plan Note (Signed)
May use her Megace--needs EMB and pap. Discussed options of oral meds, IUD, surgical options. Briefly reviewed risks/benefits of each. Need to complete w/u prior to decisions.

## 2022-06-18 NOTE — Progress Notes (Signed)
   Subjective:    Patient ID: Lacey Mccarty is a 47 y.o. female presenting with No chief complaint on file.  on 06/18/2022  HPI: She is a [id number] with 1 prior C-section. Bleeding x 4 weeks. Improved with Megace. Still bleeding since last Friday. A couple of years has been heavy.Has a lot of dysmenorrhea.  Review of Systems  Constitutional:  Negative for chills and fever.  Respiratory:  Negative for shortness of breath.   Cardiovascular:  Negative for chest pain.  Gastrointestinal:  Negative for abdominal pain, nausea and vomiting.  Genitourinary:  Negative for dysuria.  Skin:  Negative for rash.      Objective:    BP 117/78   Pulse 75   Ht 5\' 3"  (1.6 m)   LMP 06/12/2022 (Exact Date)   BMI 36.14 kg/m  Physical Exam Exam conducted with a chaperone present.  Constitutional:      General: She is not in acute distress.    Appearance: She is well-developed.  HENT:     Head: Normocephalic and atraumatic.  Eyes:     General: No scleral icterus. Cardiovascular:     Rate and Rhythm: Normal rate.  Pulmonary:     Effort: Pulmonary effort is normal.  Abdominal:     Palpations: Abdomen is soft.  Musculoskeletal:     Cervical back: Neck supple.  Skin:    General: Skin is warm and dry.  Neurological:     Mental Status: She is alert and oriented to person, place, and time.       Assessment & Plan:   Problem List Items Addressed This Visit       Unprioritized   Menorrhagia with regular cycle - Primary    May use her Megace--needs EMB and pap. Discussed options of oral meds, IUD, surgical options. Briefly reviewed risks/benefits of each. Need to complete w/u prior to decisions.      Dysmenorrhea    Trial of Diclofenac       Return in about 2 weeks (around 07/02/2022) for pap + EMB.  Reva Bores, MD 06/18/2022 8:41 AM

## 2022-06-18 NOTE — Assessment & Plan Note (Signed)
Trial of Diclofenac.  

## 2022-06-29 ENCOUNTER — Encounter: Payer: Commercial Managed Care - HMO | Admitting: Internal Medicine

## 2022-06-29 ENCOUNTER — Telehealth: Payer: Self-pay | Admitting: Internal Medicine

## 2022-06-29 NOTE — Telephone Encounter (Signed)
Good Morning Dr. Leonides Schanz,    I called this patient at 10:50 am today. She stated that she called last week to cancel due to a death in the family she would be out of town.

## 2022-07-13 ENCOUNTER — Ambulatory Visit: Payer: Commercial Managed Care - HMO

## 2022-07-28 ENCOUNTER — Ambulatory Visit: Payer: Commercial Managed Care - HMO | Admitting: Obstetrics and Gynecology

## 2022-08-26 ENCOUNTER — Telehealth: Payer: Self-pay

## 2022-08-26 MED ORDER — NORETHINDRONE ACETATE 5 MG PO TABS: 5.0000 mg | ORAL_TABLET | Freq: Every day | ORAL | 2 refills | Status: DC

## 2022-08-26 NOTE — Telephone Encounter (Signed)
Aygestin sent to the pharmacy. Take it once a day. Can increase to twice a day if not improving.

## 2022-08-26 NOTE — Telephone Encounter (Signed)
Patient states she would like to change her medication. Patient states taking the megace twice a day and it is not working. Patient is having bleeding everyday.   Patient would like to try something else to help stop the bleeding. Armandina Stammer RN

## 2022-08-26 NOTE — Telephone Encounter (Signed)
-----   Message from Madlyn Frankel sent at 08/26/2022  2:59 PM EDT ----- Regarding: Prescription Patient said she needs a change in her prescription because her bleeding is too much. She said she's already spoken with someone and sent a message.   Best contact number is [phone].

## 2022-09-16 ENCOUNTER — Encounter: Payer: Self-pay | Admitting: Family Medicine

## 2022-09-16 ENCOUNTER — Telehealth: Payer: Self-pay

## 2022-09-16 NOTE — Telephone Encounter (Signed)
Patient called and states that she is still having a period everyday even though she is taking the aygestin. Patient is frustrated with daily bleeding. Encourage patient to send provider my chart message with details of bleeding. Will route to provider for review. Armandina Stammer RN

## 2022-09-29 ENCOUNTER — Ambulatory Visit (INDEPENDENT_AMBULATORY_CARE_PROVIDER_SITE_OTHER): Payer: Medicaid Other | Admitting: Obstetrics and Gynecology

## 2022-09-29 ENCOUNTER — Encounter: Payer: Self-pay | Admitting: Obstetrics and Gynecology

## 2022-09-29 VITALS — BP 129/74 | HR 83 | Wt 201.0 lb

## 2022-09-29 DIAGNOSIS — Z3202 Encounter for pregnancy test, result negative: Secondary | ICD-10-CM

## 2022-09-29 DIAGNOSIS — N920 Excessive and frequent menstruation with regular cycle: Secondary | ICD-10-CM | POA: Diagnosis not present

## 2022-09-29 LAB — POCT URINE PREGNANCY: Preg Test, Ur: NEGATIVE

## 2022-09-29 MED ORDER — MEGESTROL ACETATE 40 MG PO TABS: ORAL_TABLET | ORAL | 3 refills | Status: AC

## 2022-09-29 NOTE — Progress Notes (Unsigned)
    GYNECOLOGY VISIT  Patient name: SRIHITHA GODARD MRN [id number]  Date of birth: February 01, 1976 Chief Complaint:   No chief complaint on file.   History:  CAROLANN LAFONT is a 47 y.o. [phone] being seen today for EMB and pap for AUB workup.     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   Health Maintenance:   Last pap ***. Results were: {Pap findings:25134}. H/O abnormal pap: {yes/yes***/no:23866} Last mammogram: ***. Results were: {normal, abnormal, n/a:23837}. Family h/o breast cancer: {yes***/no:23838}   Review of Systems:  {Ros - complete:30496} Comprehensive review of systems was otherwise negative.   Objective:  Physical Exam BP 129/74   Pulse 83   Wt 201 lb (91.2 kg)   LMP  (LMP Unknown)   BMI 35.61 kg/m    Physical Exam   Labs and Imaging No results found.     Assessment & Plan:   There are no diagnoses linked to this encounter.   *** Routine preventative health maintenance measures emphasized.  Lorriane Shire, MD [facility], [facility]

## 2022-11-20 DIAGNOSIS — Z124 Encounter for screening for malignant neoplasm of cervix: Secondary | ICD-10-CM

## 2022-11-20 NOTE — H&P (Signed)
Lacey Mccarty is an 47 y.o. [phone] female.   Chief Complaint: abnormal bleeding HPI: She is a [id number] with 1 prior C-section. Bleeding x 4 weeks. Improved with Megace. Still bleeding since last Friday. A couple of years has been heavy.Has a lot of dysmenorrhea.   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    Past Surgical History:  Procedure Laterality Date   CESAREAN SECTION     LIPOSUCTION     MICROLARYNGOSCOPY Right 07/09/2021   Procedure: DIRECT MICROLARYNGOSCOPY WITH EXCISION VOCAL CORD POLYP;  Surgeon: Vernie Murders, MD;  Location: [facility];  Service: ENT;  Laterality: Right;   NM THYROID STIM SUPRESS      Family History  Problem Relation Age of Onset   HIV Mother    Diabetes Mellitus II Mother    Esophageal cancer Father    Hypertension Father    Cancer Father        Lung   Breast cancer Maternal Aunt    Asthma Maternal Aunt    Colon cancer Paternal Grandmother    Stomach cancer Paternal Grandmother    Allergies Child    Allergies Child    Rectal cancer Neg Hx    Social History:  reports that she has been smoking cigarettes. She has a 3.8 pack-year smoking history. She has never used smokeless tobacco. She reports current alcohol use. She reports that she does not use drugs.  Allergies:  Allergies  Allergen Reactions   Tapazole [Methimazole] Hives and Shortness Of Breath    No medications prior to admission.    A comprehensive review of systems was negative.  There were no vitals taken for this visit. General appearance: alert, cooperative, and appears stated age Head: Normocephalic, without obvious abnormality, atraumatic Neck: supple, symmetrical, trachea midline Lungs:  normal effort Heart: regular rate and  rhythm Abdomen: soft, non-tender; bowel sounds normal; no masses,  no organomegaly Extremities: extremities normal, atraumatic, no cyanosis or edema Skin: Skin color, texture, turgor normal. No rashes or lesions Neurologic: Grossly normal   No results found for this or any previous visit (from the past 24 hour(s)). No results found.  Assessment/Plan Active Problems:   Menorrhagia with regular cycle   Screening for cervical cancer  For pap and D & C with hysteroscopy and IUD insertion. Risks include but are not limited to bleeding, infection, injury to surrounding structures, including bowel, bladder and ureters, blood clots, and death.  Likelihood of success is high.    Reva Bores 11/20/2022, 11:40 PM

## 2022-11-26 ENCOUNTER — Other Ambulatory Visit: Payer: Self-pay

## 2022-11-26 ENCOUNTER — Encounter (HOSPITAL_COMMUNITY): Payer: Self-pay | Admitting: Family Medicine

## 2022-11-26 NOTE — Progress Notes (Signed)
PCP -  Cardiologist -   PPM/ICD - denies Device Orders - n/a Rep Notified - n/a  Chest x-ray - 05-18-21 EKG - 4-10-23My Note    Progress Notes  No service  11/26/2022 02:43 PM   Insert SmartText   Stress Test - denies ECHO - denies Cardiac Cath - denies  CPAP - denies  DM -denies  Blood Thinner Instructions: denies Aspirin Instructions: n/a  ERAS Protcol - Clear liquids until 11:40  COVID TEST- n/a  Anesthesia review: no  Patient verbally denies any shortness of breath, fever, cough and chest pain during phone call   -------------  SDW INSTRUCTIONS given:  Your procedure is scheduled on November 30, 2022.  Report to [facility] Main Entrance "A" at 12:10 P.M., and check in at the Admitting office.  Call this number if you have problems the morning of surgery:  [phone]   Remember:  Do not eat after midnight the night before your surgery  You may drink clear liquids until 11:40 the morning of your surgery.   Clear liquids allowed are: Water, Non-Citrus Juices (without pulp), Carbonated Beverages, Clear Tea, Black Coffee Only, and Gatorade    Take these medicines the morning of surgery with A SIP OF WATER  levothyroxine (SYNTHROID)  megestrol (MEGACE)  albuterol (VENTOLIN HFA)    As of today, STOP taking any Aspirin (unless otherwise instructed by your surgeon) Aleve, Naproxen, Ibuprofen, Motrin, Advil, Goody's, BC's, all herbal medications, fish oil, and all vitamins.                      Do not wear jewelry, make up, or nail polish            Do not wear lotions, powders, perfumes/colognes, or deodorant.            Do not shave 48 hours prior to surgery.  Men may shave face and neck.            Do not bring valuables to the hospital.            [facility] is not responsible for any belongings or valuables.  Do NOT Smoke (Tobacco/Vaping) 24 hours prior to your procedure If you use a CPAP at night, you may bring all equipment for your overnight  stay.   Contacts, glasses, dentures or bridgework may not be worn into surgery.      For patients admitted to the hospital, discharge time will be determined by your treatment team.   Patients discharged the day of surgery will not be allowed to drive home, and someone needs to stay with them for 24 hours.    Special instructions:   [place]- Preparing For Surgery  Before surgery, you can play an important role. Because skin is not sterile, your skin needs to be as free of germs as possible. You can reduce the number of germs on your skin by washing with CHG (chlorahexidine gluconate) Soap before surgery.  CHG is an antiseptic cleaner which kills germs and bonds with the skin to continue killing germs even after washing.    Oral Hygiene is also important to reduce your risk of infection.  Remember - BRUSH YOUR TEETH THE MORNING OF SURGERY WITH YOUR REGULAR TOOTHPASTE  Please do not use if you have an allergy to CHG or antibacterial soaps. If your skin becomes reddened/irritated stop using the CHG. My Note    Progress Notes My Note    Progress Notes  No service  11/26/2022 02:43 PM   Insert SmartText   No service  11/26/2022 02:43 PM   Insert SmartText   Do not shave (including legs and underarms) for at least 48 hours prior to first CHG shower. It is OK to shave your face.  Please follow these instructions carefully.   Shower the NIGHT BEFORE SURGERY and the MORNING OF SURGERY with DIAL Soap.   Pat yourself dry with a CLEAN TOWEL.  Wear CLEAN PAJAMAS to bed the night before surgery  Place CLEAN SHEETS on your bed the night of your first shower and DO NOT SLEEP WITH PETS.   Day of Surgery: Please shower morning of surgery  Wear Clean/Comfortable clothing the morning of surgery Do not apply any deodorants/lotions.   Remember to brush your teeth WITH YOUR REGULAR TOOTHPASTE.   Questions were answered. Patient verbalized understanding of instructions.

## 2022-11-29 NOTE — Plan of Care (Signed)
CH[facility] Tonsillectomy/Adenoidectomy, Postoperative PEDS care plan entered in error.

## 2022-11-30 ENCOUNTER — Ambulatory Visit (HOSPITAL_COMMUNITY): Payer: Medicaid Other | Admitting: Anesthesiology

## 2022-11-30 ENCOUNTER — Other Ambulatory Visit (HOSPITAL_COMMUNITY)
Admission: RE | Admit: 2022-11-30 | Discharge: 2022-11-30 | Disposition: A | Discharge status: Home / Self Care | Payer: Medicaid Other | Source: Ambulatory Visit | Attending: Family Medicine | Admitting: Family Medicine

## 2022-11-30 ENCOUNTER — Other Ambulatory Visit: Payer: Self-pay

## 2022-11-30 ENCOUNTER — Encounter (HOSPITAL_COMMUNITY)
Admission: RE | Disposition: A | Discharge status: Home / Self Care | Payer: Self-pay | Source: Home / Self Care | Attending: Family Medicine

## 2022-11-30 ENCOUNTER — Ambulatory Visit (HOSPITAL_COMMUNITY)
Admission: RE | Admit: 2022-11-30 | Discharge: 2022-11-30 | Disposition: A | Discharge status: Home / Self Care | Payer: Medicaid Other | Attending: Family Medicine | Admitting: Family Medicine

## 2022-11-30 ENCOUNTER — Ambulatory Visit (HOSPITAL_BASED_OUTPATIENT_CLINIC_OR_DEPARTMENT_OTHER): Payer: Medicaid Other | Admitting: Anesthesiology

## 2022-11-30 ENCOUNTER — Encounter (HOSPITAL_COMMUNITY): Payer: Self-pay | Admitting: Family Medicine

## 2022-11-30 DIAGNOSIS — Z30430 Encounter for insertion of intrauterine contraceptive device: Secondary | ICD-10-CM | POA: Diagnosis not present

## 2022-11-30 DIAGNOSIS — Z01419 Encounter for gynecological examination (general) (routine) without abnormal findings: Secondary | ICD-10-CM | POA: Diagnosis not present

## 2022-11-30 DIAGNOSIS — N840 Polyp of corpus uteri: Secondary | ICD-10-CM

## 2022-11-30 DIAGNOSIS — Z6836 Body mass index (BMI) 36.0-36.9, adult: Secondary | ICD-10-CM | POA: Diagnosis not present

## 2022-11-30 DIAGNOSIS — Z124 Encounter for screening for malignant neoplasm of cervix: Secondary | ICD-10-CM

## 2022-11-30 DIAGNOSIS — Z1151 Encounter for screening for human papillomavirus (HPV): Secondary | ICD-10-CM | POA: Diagnosis not present

## 2022-11-30 DIAGNOSIS — D250 Submucous leiomyoma of uterus: Secondary | ICD-10-CM | POA: Insufficient documentation

## 2022-11-30 DIAGNOSIS — G8929 Other chronic pain: Secondary | ICD-10-CM | POA: Diagnosis not present

## 2022-11-30 DIAGNOSIS — F17210 Nicotine dependence, cigarettes, uncomplicated: Secondary | ICD-10-CM | POA: Insufficient documentation

## 2022-11-30 DIAGNOSIS — E669 Obesity, unspecified: Secondary | ICD-10-CM | POA: Insufficient documentation

## 2022-11-30 DIAGNOSIS — J45909 Unspecified asthma, uncomplicated: Secondary | ICD-10-CM | POA: Diagnosis not present

## 2022-11-30 DIAGNOSIS — M549 Dorsalgia, unspecified: Secondary | ICD-10-CM | POA: Diagnosis not present

## 2022-11-30 DIAGNOSIS — E039 Hypothyroidism, unspecified: Secondary | ICD-10-CM

## 2022-11-30 DIAGNOSIS — N939 Abnormal uterine and vaginal bleeding, unspecified: Secondary | ICD-10-CM

## 2022-11-30 DIAGNOSIS — N920 Excessive and frequent menstruation with regular cycle: Secondary | ICD-10-CM | POA: Diagnosis not present

## 2022-11-30 HISTORY — PX: HYSTEROSCOPY WITH D & C: SHX1775

## 2022-11-30 HISTORY — PX: INTRAUTERINE DEVICE (IUD) INSERTION: SHX5877

## 2022-11-30 LAB — POCT PREGNANCY, URINE: Preg Test, Ur: NEGATIVE

## 2022-11-30 SURGERY — DILATATION AND CURETTAGE /HYSTEROSCOPY
Anesthesia: General | Site: Uterus

## 2022-11-30 MED ORDER — OXYCODONE HCL 5 MG/5ML PO SOLN: 5.0000 mg | Freq: Once | ORAL | Status: AC | PRN

## 2022-11-30 MED ORDER — PROPOFOL 10 MG/ML IV BOLUS
INTRAVENOUS | Status: DC | PRN
  Administered 2022-11-30: 200 mg via INTRAVENOUS

## 2022-11-30 MED ORDER — LACTATED RINGERS IV SOLN: INTRAVENOUS | Status: DC

## 2022-11-30 MED ORDER — DEXAMETHASONE SODIUM PHOSPHATE 10 MG/ML IJ SOLN
INTRAMUSCULAR | Status: AC
  Filled 2022-11-30: qty 1

## 2022-11-30 MED ORDER — ONDANSETRON HCL 4 MG/2ML IJ SOLN: 4.0000 mg | Freq: Once | INTRAMUSCULAR | Status: DC | PRN

## 2022-11-30 MED ORDER — ONDANSETRON HCL 4 MG/2ML IJ SOLN
INTRAMUSCULAR | Status: AC
  Filled 2022-11-30: qty 2

## 2022-11-30 MED ORDER — LIDOCAINE 2% (20 MG/ML) 5 ML SYRINGE
INTRAMUSCULAR | Status: AC
  Filled 2022-11-30: qty 5

## 2022-11-30 MED ORDER — OXYCODONE HCL 5 MG PO TABS
ORAL_TABLET | ORAL | Status: AC
  Filled 2022-11-30: qty 1

## 2022-11-30 MED ORDER — KETOROLAC TROMETHAMINE 30 MG/ML IJ SOLN: 30.0000 mg | Freq: Once | INTRAMUSCULAR | Status: DC | PRN

## 2022-11-30 MED ORDER — MIDAZOLAM HCL 2 MG/2ML IJ SOLN
INTRAMUSCULAR | Status: AC
  Filled 2022-11-30: qty 2

## 2022-11-30 MED ORDER — FENTANYL CITRATE (PF) 100 MCG/2ML IJ SOLN
INTRAMUSCULAR | Status: AC
  Filled 2022-11-30: qty 2

## 2022-11-30 MED ORDER — PROPOFOL 10 MG/ML IV BOLUS
INTRAVENOUS | Status: AC
  Filled 2022-11-30: qty 20

## 2022-11-30 MED ORDER — HYDROMORPHONE HCL 1 MG/ML IJ SOLN
INTRAMUSCULAR | Status: AC
  Filled 2022-11-30: qty 1

## 2022-11-30 MED ORDER — MEPERIDINE HCL 25 MG/ML IJ SOLN: 6.2500 mg | INTRAMUSCULAR | Status: DC | PRN

## 2022-11-30 MED ORDER — BUPIVACAINE HCL (PF) 0.25 % IJ SOLN
INTRAMUSCULAR | Status: AC
  Filled 2022-11-30: qty 30

## 2022-11-30 MED ORDER — LEVONORGESTREL 20 MCG/DAY IU IUD
1.0000 | INTRAUTERINE_SYSTEM | INTRAUTERINE | Status: AC
  Administered 2022-11-30: 1 via INTRAUTERINE

## 2022-11-30 MED ORDER — POVIDONE-IODINE 10 % EX SWAB
2.0000 | Freq: Once | CUTANEOUS | Status: AC
  Administered 2022-11-30: 2 via TOPICAL

## 2022-11-30 MED ORDER — KETOROLAC TROMETHAMINE 30 MG/ML IJ SOLN
INTRAMUSCULAR | Status: DC | PRN
  Administered 2022-11-30: 30 mg via INTRAVENOUS

## 2022-11-30 MED ORDER — LEVONORGESTREL 20 MCG/DAY IU IUD
INTRAUTERINE_SYSTEM | INTRAUTERINE | Status: AC
  Filled 2022-11-30: qty 1

## 2022-11-30 MED ORDER — CHLORHEXIDINE GLUCONATE 0.12 % MT SOLN
OROMUCOSAL | Status: AC
  Administered 2022-11-30: 15 mL
  Filled 2022-11-30: qty 15

## 2022-11-30 MED ORDER — OXYCODONE HCL 5 MG PO TABS
5.0000 mg | ORAL_TABLET | Freq: Once | ORAL | Status: AC | PRN
  Administered 2022-11-30: 5 mg via ORAL

## 2022-11-30 MED ORDER — AMISULPRIDE (ANTIEMETIC) 5 MG/2ML IV SOLN: 10.0000 mg | Freq: Once | INTRAVENOUS | Status: DC | PRN

## 2022-11-30 MED ORDER — FENTANYL CITRATE (PF) 250 MCG/5ML IJ SOLN
INTRAMUSCULAR | Status: DC | PRN
  Administered 2022-11-30: 50 ug via INTRAVENOUS
  Administered 2022-11-30 (×2): 25 ug via INTRAVENOUS

## 2022-11-30 MED ORDER — BUPIVACAINE HCL 0.25 % IJ SOLN
INTRAMUSCULAR | Status: DC | PRN
  Administered 2022-11-30: 20 mL

## 2022-11-30 MED ORDER — LIDOCAINE HCL (PF) 1 % IJ SOLN
INTRAMUSCULAR | Status: AC
  Filled 2022-11-30: qty 30

## 2022-11-30 MED ORDER — HYDROMORPHONE HCL 1 MG/ML IJ SOLN
0.2500 mg | INTRAMUSCULAR | Status: DC | PRN
  Administered 2022-11-30 (×2): 0.5 mg via INTRAVENOUS

## 2022-11-30 MED ORDER — ONDANSETRON HCL 4 MG/2ML IJ SOLN
INTRAMUSCULAR | Status: DC | PRN
  Administered 2022-11-30: 4 mg via INTRAVENOUS

## 2022-11-30 MED ORDER — KETOROLAC TROMETHAMINE 30 MG/ML IJ SOLN
INTRAMUSCULAR | Status: AC
  Filled 2022-11-30: qty 1

## 2022-11-30 MED ORDER — LIDOCAINE 2% (20 MG/ML) 5 ML SYRINGE
INTRAMUSCULAR | Status: DC | PRN
  Administered 2022-11-30: 60 mg via INTRAVENOUS

## 2022-11-30 MED ORDER — MIDAZOLAM HCL 2 MG/2ML IJ SOLN
INTRAMUSCULAR | Status: DC | PRN
  Administered 2022-11-30: 2 mg via INTRAVENOUS

## 2022-11-30 MED ORDER — ACETAMINOPHEN 500 MG PO TABS: 1000.0000 mg | ORAL_TABLET | ORAL | Status: DC

## 2022-11-30 MED ORDER — SODIUM CHLORIDE 0.9 % IR SOLN
Status: DC | PRN
  Administered 2022-11-30: 3000 mL

## 2022-11-30 MED ORDER — DEXAMETHASONE SODIUM PHOSPHATE 10 MG/ML IJ SOLN
INTRAMUSCULAR | Status: DC | PRN
  Administered 2022-11-30: 10 mg via INTRAVENOUS

## 2022-11-30 MED ORDER — ACETAMINOPHEN 500 MG PO TABS
1000.0000 mg | ORAL_TABLET | Freq: Once | ORAL | Status: AC
  Administered 2022-11-30: 1000 mg via ORAL
  Filled 2022-11-30: qty 2

## 2022-11-30 SURGICAL SUPPLY — 11 items
CATH ROBINSON RED A/P 16FR (CATHETERS) ×2 IMPLANT
DEVICE MYOSURE REACH (MISCELLANEOUS) IMPLANT
GLOVE ECLIPSE 7.0 STRL STRAW (GLOVE) ×2 IMPLANT
GLOVE SURG UNDER POLY LF SZ7 (GLOVE) ×4 IMPLANT
GOWN STRL REUS W/ TWL XL LVL3 (GOWN DISPOSABLE) ×4 IMPLANT
GOWN STRL REUS W/TWL XL LVL3 (GOWN DISPOSABLE) ×4
KIT PROCEDURE FLUENT (KITS) ×2 IMPLANT
Mirena Levonorgestrel-releasing intrauterine syste IMPLANT
PACK VAGINAL MINOR WOMEN LF (CUSTOM PROCEDURE TRAY) ×2 IMPLANT
PAD OB MATERNITY 4.3X12.25 (PERSONAL CARE ITEMS) ×2 IMPLANT
TOWEL GREEN STERILE FF (TOWEL DISPOSABLE) ×2 IMPLANT

## 2022-11-30 NOTE — Transfer of Care (Signed)
Immediate Anesthesia Transfer of Care Note  Patient: Lacey Mccarty  Procedure(s) Performed: DILATATION AND CURETTAGE /HYSTEROSCOPY  WITH PAP SMEAR (Uterus) INTRAUTERINE DEVICE (IUD) INSERTION (Cervix)  Patient Location: PACU  Anesthesia Type:General  Level of Consciousness: awake, alert , oriented, and patient cooperative  Airway & Oxygen Therapy: Patient Spontanous Breathing and Patient connected to face mask oxygen  Post-op Assessment: Report given to RN and Post -op Vital signs reviewed and stable  Post vital signs: Reviewed and stable  Last Vitals:  Vitals Value Taken Time  BP    Temp    Pulse    Resp    SpO2      Last Pain:  Vitals:   11/30/22 1248  TempSrc:   PainSc: 0-No pain         Complications: No notable events documented.

## 2022-11-30 NOTE — Anesthesia Postprocedure Evaluation (Signed)
Anesthesia Post Note  Patient: Lacey Mccarty  Procedure(s) Performed: DILATATION AND CURETTAGE /HYSTEROSCOPY  WITH PAP SMEAR (Uterus) INTRAUTERINE DEVICE (IUD) INSERTION (Cervix)     Patient location during evaluation: PACU Anesthesia Type: General Level of consciousness: awake and alert Pain management: pain level controlled Vital Signs Assessment: post-procedure vital signs reviewed and stable Respiratory status: spontaneous breathing, nonlabored ventilation and respiratory function stable Cardiovascular status: blood pressure returned to baseline and stable Postop Assessment: no apparent nausea or vomiting Anesthetic complications: no  No notable events documented.  Last Vitals:  Vitals:   11/30/22 1615 11/30/22 1630  BP: 119/89 117/82  Pulse: 69 65  Resp: 15 11  Temp:  36.4 C  SpO2: 93% 97%    Last Pain:  Vitals:   11/30/22 1630  TempSrc:   PainSc: 2                  Kyshaun Barnette,W. EDMOND

## 2022-11-30 NOTE — Op Note (Signed)
Preoperative diagnoses:  abnormal uterine bleeding, needs pap smear  Postoperative diagnosis: Same  Procedure: D & C, hysteroscopy, myosure sampling and polypectomy, IUD insertion, Pap smear  Surgeon: Shelbie Proctor. Shawnie Pons, MD  Anesthesia: Gaynelle Adu, MD  Findings: small polypoid masses in the uterus  Fluid deficit: 200 cc  Estimated blood loss: Minimum  Pathology: Endometrial curettings  Indications for procedure: 47 y.o. [id number] with history of abnormal bleeding and could not tolerate office EMB who presents for definitive treatment and diagnosis.  Procedure: The patient was taken to the operating room where spinal analgesia was inserted. SCDs were in place.  Time out was performed. Patient was placed in dorsolithotomy in [place] stirrups. She was prepped and draped in the usual sterile fashion. A Red Rubber catheter was used to drain her bladder. A speculum was placeed in the vagina. The cervix was visualized anteriorly and grasped with a single-tooth tenaculum. Paracervical block was performed with 1% lidocaine plain with 20 cc injected. The uterus sounded to 8  cm. Sequential dilation was performed with Shawnie Pons dilators. The hysteroscope was inserted and the endometrial cavity and inspected. There were above findings noted in the endometrial cavity with both ostia seen. The Myosure Reach was used to removed the polypoid tissue and sample the lining. The hysteroscope was removed. Sharp curettage was performed in all 4 quadrants. Mirena IUD placed without difficulty. Pap smear obtained.  All instruments were removed from the vagina. All instrument, needle and lap counts were correct x2. The patient was awakened and is recovering in stable condition.  Reva Bores MD 11/30/2022 3:27 PM

## 2022-11-30 NOTE — OR Nursing (Signed)
In and out cath performed periop by DR PRATT. Returned approx 100 ml

## 2022-11-30 NOTE — Anesthesia Preprocedure Evaluation (Addendum)
Anesthesia Evaluation  Patient identified by MRN, date of birth, ID band Patient awake    Reviewed: Allergy & Precautions, NPO status , Patient's Chart, lab work & pertinent test results  Airway Mallampati: III  TM Distance: >3 FB Neck ROM: Full    Dental  (+) Teeth Intact, Dental Advisory Given   Pulmonary asthma (well controlled) , Current Smoker   Pulmonary exam normal breath sounds clear to auscultation       Cardiovascular negative cardio ROS Normal cardiovascular exam Rhythm:Regular Rate:Normal     Neuro/Psych negative neurological ROS  negative psych ROS   GI/Hepatic negative GI ROS, Neg liver ROS,,,  Endo/Other  Hypothyroidism  Obesity BMI 36  Renal/GU negative Renal ROS  negative genitourinary   Musculoskeletal  (+) Arthritis , Osteoarthritis,    Abdominal  (+) + obese  Peds  Hematology negative hematology ROS (+)   Anesthesia Other Findings   Reproductive/Obstetrics AUB                             Anesthesia Physical Anesthesia Plan  ASA: 3  Anesthesia Plan: General   Post-op Pain Management: Tylenol PO (pre-op)*   Induction: Intravenous  PONV Risk Score and Plan: 2 and Ondansetron, Dexamethasone, Midazolam and Treatment may vary due to age or medical condition  Airway Management Planned: LMA  Additional Equipment: None  Intra-op Plan:   Post-operative Plan: Extubation in OR  Informed Consent: I have reviewed the patients History and Physical, chart, labs and discussed the procedure including the risks, benefits and alternatives for the proposed anesthesia with the patient or authorized representative who has indicated his/her understanding and acceptance.     Dental advisory given  Plan Discussed with: CRNA  Anesthesia Plan Comments:        Anesthesia Quick Evaluation

## 2022-11-30 NOTE — Interval H&P Note (Signed)
History and Physical Interval Note:  11/30/2022 2:45 PM  Lacey Mccarty  has presented today for surgery, with the diagnosis of Abnormal uterine bleeding.  The various methods of treatment have been discussed with the patient and family. After consideration of risks, benefits and other options for treatment, the patient has consented to  Procedure(s): DILATATION AND CURETTAGE /HYSTEROSCOPY (N/A) INTRAUTERINE DEVICE (IUD) INSERTION (N/A) as a surgical intervention.  The patient's history has been reviewed, patient examined, no change in status, stable for surgery.  I have reviewed the patient's chart and labs.  Questions were answered to the patient's satisfaction.     Reva Bores

## 2022-11-30 NOTE — Anesthesia Procedure Notes (Signed)
Procedure Name: LMA Insertion Date/Time: 11/30/2022 3:02 PM  Performed by: Stanton Kidney, CRNAPre-anesthesia Checklist: Patient identified, Patient being monitored, Timeout performed, Emergency Drugs available and Suction available Patient Re-evaluated:Patient Re-evaluated prior to induction Oxygen Delivery Method: Circle system utilized Preoxygenation: Pre-oxygenation with 100% oxygen Induction Type: IV induction Ventilation: Mask ventilation without difficulty LMA: LMA inserted Tube type: Oral Number of attempts: 1 Placement Confirmation: positive ETCO2 and breath sounds checked- equal and bilateral Tube secured with: Tape Dental Injury: Teeth and Oropharynx as per pre-operative assessment

## 2022-12-01 ENCOUNTER — Encounter (HOSPITAL_COMMUNITY): Payer: Self-pay | Admitting: Family Medicine

## 2022-12-01 ENCOUNTER — Other Ambulatory Visit: Payer: Self-pay | Admitting: Family Medicine

## 2022-12-01 LAB — SURGICAL PATHOLOGY

## 2022-12-01 MED ORDER — MELOXICAM 7.5 MG PO TABS: 7.5000 mg | ORAL_TABLET | Freq: Every day | ORAL | 2 refills | Status: AC

## 2022-12-09 LAB — CYTOLOGY - PAP
Comment: NEGATIVE
Diagnosis: NEGATIVE
High risk HPV: NEGATIVE

## 2022-12-28 ENCOUNTER — Telehealth: Payer: Self-pay

## 2022-12-28 NOTE — Telephone Encounter (Signed)
Left message for pt to call office back regarding 2 scheduled appts w/ Dr. Shawnie Pons here and [facility] to see which location she prefers.

## 2022-12-29 ENCOUNTER — Telehealth (INDEPENDENT_AMBULATORY_CARE_PROVIDER_SITE_OTHER): Payer: Medicaid Other | Admitting: Family Medicine

## 2022-12-29 DIAGNOSIS — N920 Excessive and frequent menstruation with regular cycle: Secondary | ICD-10-CM | POA: Diagnosis not present

## 2022-12-29 DIAGNOSIS — Z09 Encounter for follow-up examination after completed treatment for conditions other than malignant neoplasm: Secondary | ICD-10-CM

## 2022-12-29 NOTE — Progress Notes (Signed)
GYN Post Op virtual visit .   Procedure: D & C, hysteroscopy, myosure sampling and polypectomy, IUD insertion, Pap smear .  Pt wants to know how long she has to keep IUD has no other concerns.

## 2022-12-29 NOTE — Progress Notes (Signed)
GYNECOLOGY VIRTUAL VISIT ENCOUNTER NOTE  Provider location: [facility]   Patient location: Home  I connected with Lacey Mccarty on 12/29/22 at  2:30 PM EST by MyChart Video Encounter and verified that I am speaking with the correct person using two identifiers.   I discussed the limitations, risks, security and privacy concerns of performing an evaluation and management service virtually and the availability of in person appointments. I also discussed with the patient that there may be a patient responsible charge related to this service. The patient expressed understanding and agreed to proceed.   History:  Lacey Mccarty is a 46 y.o. [phone] female being evaluated today for postop check. She is s/p D & C with hysteroscopy, polyp removal and IUD insertion. She denies any abnormal vaginal discharge, bleeding, pelvic pain or other concerns.       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   Past Surgical History:  Procedure Laterality Date   CESAREAN SECTION     HYSTEROSCOPY WITH D & C N/A 11/30/2022   Procedure: DILATATION AND CURETTAGE /HYSTEROSCOPY  WITH PAP SMEAR;  Surgeon: Reva Bores, MD;  Location: [facility] OR;  Service: Gynecology;  Laterality: N/A;   INTRAUTERINE DEVICE (IUD) INSERTION N/A 11/30/2022   Procedure: INTRAUTERINE DEVICE (IUD) INSERTION;  Surgeon: Reva Bores, MD;  Location: [facility] OR;  Service: Gynecology;  Laterality: N/A;   LIPOSUCTION     MICROLARYNGOSCOPY Right 07/09/2021   Procedure: DIRECT MICROLARYNGOSCOPY WITH EXCISION VOCAL CORD POLYP;  Surgeon: Vernie Murders, MD;  Location: [facility];  Service: ENT;  Laterality: Right;   NM THYROID STIM SUPRESS     The following portions of  the patient's history were reviewed and updated as appropriate: allergies, current medications, past family history, past medical history, past social history, past surgical history and problem list.   Health Maintenance:  Normal pap and negative HRHPV on 11/30/2022.  Normal mammogram.   Review of Systems:  Pertinent items noted in HPI and remainder of comprehensive ROS otherwise negative.  Physical Exam:   General:  Alert, oriented and cooperative. Patient appears to be in no acute distress.  Mental Status: Normal mood and affect. Normal behavior. Normal judgment and thought content.   Respiratory: Normal respiratory effort, no problems with respiration noted  Rest of physical exam deferred due to type of encounter  Labs and Imaging No results found for this or any previous visit (from the past 336 hour(s)). No results found.     Assessment and Plan:     Problem List Items Addressed This Visit       Unprioritized   Menorrhagia with regular cycle    Improved with IUD insertion and following a D & C.      Other Visit Diagnoses     Postop check    -  Primary   Doing well. No issues today. No cycle.            I discussed the assessment and treatment plan with the patient. The patient was provided an opportunity to ask questions and all were answered. The patient agreed with the plan and demonstrated an understanding of the  instructions.   The patient was advised to call back or seek an in-person evaluation/go to the ED if the symptoms worsen or if the condition fails to improve as anticipated.  I provided 4 minutes of face-to-face time during this encounter.   Reva Bores, MD [facility] for [employer], [facility]

## 2022-12-29 NOTE — Assessment & Plan Note (Signed)
Improved with IUD insertion and following a D & C.

## 2023-01-24 ENCOUNTER — Encounter: Payer: Medicaid Other | Admitting: Family Medicine

## 2023-02-11 ENCOUNTER — Emergency Department (HOSPITAL_BASED_OUTPATIENT_CLINIC_OR_DEPARTMENT_OTHER)
Admission: EM | Admit: 2023-02-11 | Discharge: 2023-02-11 | Disposition: A | Discharge status: Home / Self Care | Payer: Medicaid Other | Attending: Emergency Medicine | Admitting: Emergency Medicine

## 2023-02-11 ENCOUNTER — Other Ambulatory Visit (HOSPITAL_BASED_OUTPATIENT_CLINIC_OR_DEPARTMENT_OTHER): Payer: Self-pay

## 2023-02-11 ENCOUNTER — Encounter (HOSPITAL_BASED_OUTPATIENT_CLINIC_OR_DEPARTMENT_OTHER): Payer: Self-pay

## 2023-02-11 ENCOUNTER — Other Ambulatory Visit: Payer: Self-pay

## 2023-02-11 DIAGNOSIS — B9789 Other viral agents as the cause of diseases classified elsewhere: Secondary | ICD-10-CM | POA: Diagnosis not present

## 2023-02-11 DIAGNOSIS — R0981 Nasal congestion: Secondary | ICD-10-CM | POA: Diagnosis present

## 2023-02-11 DIAGNOSIS — J988 Other specified respiratory disorders: Secondary | ICD-10-CM | POA: Insufficient documentation

## 2023-02-11 DIAGNOSIS — J45909 Unspecified asthma, uncomplicated: Secondary | ICD-10-CM | POA: Diagnosis not present

## 2023-02-11 DIAGNOSIS — Z20822 Contact with and (suspected) exposure to covid-19: Secondary | ICD-10-CM | POA: Diagnosis not present

## 2023-02-11 DIAGNOSIS — G8929 Other chronic pain: Secondary | ICD-10-CM | POA: Insufficient documentation

## 2023-02-11 DIAGNOSIS — M2550 Pain in unspecified joint: Secondary | ICD-10-CM | POA: Insufficient documentation

## 2023-02-11 LAB — RESP PANEL BY RT-PCR (RSV, FLU A&B, COVID)  RVPGX2
Influenza A by PCR: NEGATIVE
Influenza B by PCR: NEGATIVE
Resp Syncytial Virus by PCR: NEGATIVE
SARS Coronavirus 2 by RT PCR: NEGATIVE

## 2023-02-11 MED ORDER — BENZONATATE 100 MG PO CAPS
100.0000 mg | ORAL_CAPSULE | Freq: Three times a day (TID) | ORAL | 0 refills | Status: DC
  Filled 2023-02-11: qty 21, 7d supply, fill #0

## 2023-02-11 NOTE — ED Provider Notes (Signed)
 [place] [facility] DEPARTMENT AT [facility] Provider Note   CSN: [id number] Arrival date & time: 02/11/23  1004     History  Chief Complaint  Patient presents with   Wheezing   Generalized Body Aches   Hand Pain    Lacey Mccarty is a 48 y.o. female with history of asthma, Graves' disease, chronic back pain, who presents the emergency department complaining of nasal congestion, cough, wheezing, and bodyaches for the past 2 days.  She has not taken anything for her symptoms.  She has not felt the need to use her inhaler.  States that she is intermittently having a productive cough with yellow phlegm.  Patient also complaining of generalized joint pain and intermittent numbness of her hands and feet.  States has been going on for several months, if not years.  She sometimes will take medication for it, but otherwise ignores it.  States that she was previously tested for lupus and this was negative.   Wheezing Associated symptoms: cough   Hand Pain       Home Medications Prior to Admission medications   Medication Sig Start Date End Date Taking? Authorizing Provider  benzonatate  (TESSALON ) 100 MG capsule Take 1 capsule (100 mg total) by mouth every 8 (eight) hours. 02/11/23  Yes Coron Rossano T, PA-C  albuterol  (VENTOLIN  HFA) 108 (90 Base) MCG/ACT inhaler Inhale 1-2 puffs into the lungs every 6 (six) hours as needed for wheezing or shortness of breath. 05/18/21   Badalamente, Peter R, PA-C  levothyroxine  (SYNTHROID ) 175 MCG tablet Take 1 tablet (175 mcg total) by mouth daily before breakfast. Patient taking differently: Take 125 mcg by mouth daily before breakfast. 04/28/22   Nichols, Tonya S, NP  megestrol  (MEGACE ) 40 MG tablet Take two tablets three times daily for 3 days, then two tablets twice daily Patient taking differently: Take 40 mg by mouth 2 (two) times daily. 09/29/22   Ajewole, Christana, MD  meloxicam  (MOBIC ) 7.5 MG tablet Take 1 tablet (7.5 mg total) by  mouth daily. Patient not taking: Reported on 09/29/2022 04/28/22   Oley Bascom GORMAN, NP  meloxicam  (MOBIC ) 7.5 MG tablet Take 1 tablet (7.5 mg total) by mouth daily. 12/01/22   Fredirick Glenys GORMAN, MD      Allergies    Tapazole  [methimazole ]    Review of Systems   Review of Systems  HENT:  Positive for congestion.   Respiratory:  Positive for cough and wheezing.   Musculoskeletal:  Positive for arthralgias and myalgias.  All other systems reviewed and are negative.   Physical Exam Updated Vital Signs BP 127/80 (BP Location: Right Arm)   Pulse 85   Temp 97.9 F (36.6 C) (Oral)   Resp 17   Ht 5' 3 (1.6 m)   Wt 91 kg   SpO2 99%   BMI 35.54 kg/m  Physical Exam Vitals and nursing note reviewed.  Constitutional:      Appearance: Normal appearance.  HENT:     Head: Normocephalic and atraumatic.  Eyes:     Conjunctiva/sclera: Conjunctivae normal.  Cardiovascular:     Rate and Rhythm: Normal rate and regular rhythm.  Pulmonary:     Effort: Pulmonary effort is normal. No respiratory distress.     Breath sounds: Normal breath sounds.  Abdominal:     General: There is no distension.     Palpations: Abdomen is soft.     Tenderness: There is no abdominal tenderness.  Skin:    General: Skin is  warm and dry.  Neurological:     General: No focal deficit present.     Mental Status: She is alert.     ED Results / Procedures / Treatments   Labs (all labs ordered are listed, but only abnormal results are displayed) Labs Reviewed  RESP PANEL BY RT-PCR (RSV, FLU A&B, COVID)  [id number]    EKG None  Radiology No results found.  Procedures Procedures    Medications Ordered in ED Medications - No data to display  ED Course/ Medical Decision Making/ A&P                                 Medical Decision Making Risk Prescription drug management.   This patient is a 48 y.o. female who presents to the ED for concern of congestion, cough, wheezing, joint pain.   Differential  diagnoses prior to evaluation: The emergent differential diagnosis includes, but is not limited to,  upper respiratory infection, lower respiratory infection, allergies, asthma, irritants, sinus/esophageal foreign body, medications, reflux, interstitial lung disease, postnasal drip, viral illness, sepsis. This is not an exhaustive differential.   Past Medical History / Co-morbidities / Additional history: Chart reviewed. Pertinent results include: asthma, Graves' disease, chronic back pain  Physical Exam: Physical exam performed. The pertinent findings include: Normal vital signs, no acute distress.  Lung sounds clear, normal respiratory effort, normal SpO2 on room air.  Lab Tests/Imaging studies: I personally interpreted labs/imaging and the pertinent results include:  respiratory panel negative.    Disposition: After consideration of the diagnostic results and the patients response to treatment, I feel that emergency department workup does not suggest an emergent condition requiring admission or immediate intervention beyond what has been performed at this time. Patient with symptoms consistent with viral URI.  Vitals are stable. No signs of dehydration, tolerating PO's.  Lungs are clear. The plan is: Patient will be discharged with instructions to orally hydrate, rest, and use over-the-counter medications such as anti-inflammatories such as ibuprofen  and Tylenol  for fever.   We discussed that her joint pain could be related to neuropathy, arthritis, or other rheumatologic conditions.  She has an appointment scheduled with a primary care provider on 1/30, and I also encouraged her to potentially follow-up with orthopedics depending on what they say at her PCP.  At this time I have low concern for emergent pathology as her symptoms been going on for quite some time, and do not seem to be connected to the acute URI symptoms she is having today.  The patient is safe for discharge and has been  instructed to return immediately for worsening symptoms, change in symptoms or any other concerns.  Final Clinical Impression(s) / ED Diagnoses Final diagnoses:  Viral respiratory infection  Chronic joint pain    Rx / DC Orders ED Discharge Orders          Ordered    benzonatate  (TESSALON ) 100 MG capsule  Every 8 hours        02/11/23 1217           Portions of this report may have been transcribed using voice recognition software. Every effort was made to ensure accuracy; however, inadvertent computerized transcription errors may be present.    Randie Tallarico T, PA-C 02/11/23 1221    Lacey Bernarda SQUIBB, DO 02/14/23 [phone]

## 2023-02-11 NOTE — ED Triage Notes (Addendum)
 The patient has had wheezing and body aches for two days. She is also concerned about swelling to his hands with pain. She is also having numbness to her hands a feet for months.

## 2023-02-11 NOTE — Discharge Instructions (Addendum)
 You were seen in the emergency department today for congestion, wheezing, cough, and joint pain.  As we discussed you tested negative for flu, COVID, and RSV.  However, I think you likely have a different virus causing your symptoms today. We unfortunately cannot test for them all, but we treat them all the same way.   You can take ibuprofen  or Tylenol  for pain or fever, and I recommend alternating between the 2.  Make sure that you are drinking lots of fluids and getting plenty of rest. You can take decongestants as long as you take them with lots of water. You can use lozenges or chloraseptic spray as needed for sore throat.   Please use Tylenol  or ibuprofen  for pain.  You may use 600 mg ibuprofen  every 6 hours or 1000 mg of Tylenol  every 6 hours.  You may choose to alternate between the 2.  This would be most effective.  Do not exceed 4 g of Tylenol  within 24 hours.  Do not exceed 3200 mg ibuprofen  within 24 hours.  I've sent a prescription for cough medicine to your pharmacy. Please use your inhaler if needed for shortness of breath or wheezing.  Continue to monitor how you are doing, and return to the emergency department for new or worsening symptoms such as chest pain, difficulty breathing not related to coughing, fever despite medication, or persistent vomiting or diarrhea.  It has been a pleasure taking care of you today and I hope you begin to feel better soon!  I recommend following up with your primary care provider or orthopedics for your ongoing joint pain. I've attached contact information for an orthopedist.

## 2023-02-14 ENCOUNTER — Other Ambulatory Visit: Payer: Self-pay

## 2023-02-14 ENCOUNTER — Emergency Department (HOSPITAL_BASED_OUTPATIENT_CLINIC_OR_DEPARTMENT_OTHER): Payer: Medicaid Other

## 2023-02-14 ENCOUNTER — Other Ambulatory Visit (HOSPITAL_BASED_OUTPATIENT_CLINIC_OR_DEPARTMENT_OTHER): Payer: Self-pay

## 2023-02-14 ENCOUNTER — Encounter (HOSPITAL_BASED_OUTPATIENT_CLINIC_OR_DEPARTMENT_OTHER): Payer: Self-pay

## 2023-02-14 ENCOUNTER — Emergency Department (HOSPITAL_BASED_OUTPATIENT_CLINIC_OR_DEPARTMENT_OTHER)
Admission: EM | Admit: 2023-02-14 | Discharge: 2023-02-14 | Disposition: A | Discharge status: Home / Self Care | Payer: Medicaid Other | Attending: Emergency Medicine | Admitting: Emergency Medicine

## 2023-02-14 DIAGNOSIS — R062 Wheezing: Secondary | ICD-10-CM | POA: Diagnosis present

## 2023-02-14 DIAGNOSIS — J069 Acute upper respiratory infection, unspecified: Secondary | ICD-10-CM | POA: Insufficient documentation

## 2023-02-14 DIAGNOSIS — J4521 Mild intermittent asthma with (acute) exacerbation: Secondary | ICD-10-CM | POA: Diagnosis not present

## 2023-02-14 MED ORDER — PREDNISONE 50 MG PO TABS
60.0000 mg | ORAL_TABLET | Freq: Once | ORAL | Status: AC
  Administered 2023-02-14: 60 mg via ORAL
  Filled 2023-02-14: qty 1

## 2023-02-14 MED ORDER — IPRATROPIUM-ALBUTEROL 0.5-2.5 (3) MG/3ML IN SOLN
3.0000 mL | Freq: Once | RESPIRATORY_TRACT | Status: AC
  Administered 2023-02-14: 3 mL via RESPIRATORY_TRACT
  Filled 2023-02-14: qty 3

## 2023-02-14 MED ORDER — ALBUTEROL SULFATE HFA 108 (90 BASE) MCG/ACT IN AERS
1.0000 | INHALATION_SPRAY | Freq: Once | RESPIRATORY_TRACT | Status: AC
  Administered 2023-02-14: 1 via RESPIRATORY_TRACT
  Filled 2023-02-14: qty 6.7

## 2023-02-14 MED ORDER — ALBUTEROL SULFATE (2.5 MG/3ML) 0.083% IN NEBU
2.5000 mg | INHALATION_SOLUTION | Freq: Once | RESPIRATORY_TRACT | Status: AC
  Administered 2023-02-14: 2.5 mg via RESPIRATORY_TRACT
  Filled 2023-02-14: qty 3

## 2023-02-14 MED ORDER — PREDNISONE 20 MG PO TABS
40.0000 mg | ORAL_TABLET | Freq: Every day | ORAL | 0 refills | Status: AC
  Filled 2023-02-14: qty 8, 4d supply, fill #0

## 2023-02-14 NOTE — Discharge Instructions (Signed)
 You have been seen today for your complaint of cough, congestion, shortness of breath. Your imaging was reassuring. Your discharge medications include prednisone .  This is a steroid.  Take it as prescribed and for the entire duration of the prescription. Home care instructions are as follows:  Use Claritin  and Flonase daily.  Follow dosing instructions on the packaging.  These are over-the-counter medications. Follow up with: Primary care provider in 1 week Please seek immediate medical care if you develop any of the following symptoms: You have shortness of breath that gets worse. You have severe or persistent: Headache. Ear pain. Sinus pain. Chest pain. You have chronic lung disease along with any of the following: Making high-pitched whistling sounds when you breathe, most often when you breathe out (wheezing). Prolonged cough (more than 14 days). Coughing up blood. A change in your usual mucus. You have a stiff neck. You have changes in your: Vision. Hearing. Thinking. Mood. At this time there does not appear to be the presence of an emergent medical condition, however there is always the potential for conditions to change. Please read and follow the below instructions.  Do not take your medicine if  develop an itchy rash, swelling in your mouth or lips, or difficulty breathing; call 911 and seek immediate emergency medical attention if this occurs.  You may review your lab tests and imaging results in their entirety on your MyChart account.  Please discuss all results of fully with your primary care provider and other specialist at your follow-up visit.  Note: Portions of this text may have been transcribed using voice recognition software. Every effort was made to ensure accuracy; however, inadvertent computerized transcription errors may still be present.

## 2023-02-14 NOTE — ED Provider Notes (Signed)
 [facility] Provider Note   CSN: [id number] Arrival date & time: 02/14/23  1339     History  Chief Complaint  Patient presents with   Shortness of Breath    Lacey Mccarty is a 48 y.o. female.  With history of asthma, Graves' disease, anemia presenting to the ED for evaluation of cough, congestion and wheezing.  Symptoms initially began 5 days ago.  Was seen here 3 days ago and had a negative flu, COVID and RSV swab.  She reports yesterday while at work she began to use her rescue inhaler more often.  States she has been using it every 3 hours today.  She reports worsening congestion as well.  No fevers.  Cough is nonproductive.  No chest pain.   Shortness of Breath Associated symptoms: cough        Home Medications Prior to Admission medications   Medication Sig Start Date End Date Taking? Authorizing Provider  predniSONE  (DELTASONE ) 20 MG tablet Take 2 tablets (40 mg total) by mouth daily with breakfast for 4 days. 02/14/23 02/18/23 Yes Erynne Kealey, Marsa HERO, PA-C  albuterol  (VENTOLIN  HFA) 108 (90 Base) MCG/ACT inhaler Inhale 1-2 puffs into the lungs every 6 (six) hours as needed for wheezing or shortness of breath. 05/18/21   Eudelia Maude SAUNDERS, PA-C  benzonatate  (TESSALON ) 100 MG capsule Take 1 capsule (100 mg total) by mouth every 8 (eight) hours. 02/11/23   Roemhildt, Lorin T, PA-C  levothyroxine  (SYNTHROID ) 175 MCG tablet Take 1 tablet (175 mcg total) by mouth daily before breakfast. Patient taking differently: Take 125 mcg by mouth daily before breakfast. 04/28/22   Nichols, Tonya S, NP  megestrol  (MEGACE ) 40 MG tablet Take two tablets three times daily for 3 days, then two tablets twice daily Patient taking differently: Take 40 mg by mouth 2 (two) times daily. 09/29/22   Ajewole, Christana, MD  meloxicam  (MOBIC ) 7.5 MG tablet Take 1 tablet (7.5 mg total) by mouth daily. Patient not taking: Reported on 09/29/2022 04/28/22   Oley Bascom GORMAN, NP  meloxicam  (MOBIC ) 7.5 MG tablet Take 1 tablet (7.5 mg total) by mouth daily. 12/01/22   Fredirick Glenys GORMAN, MD      Allergies    Tapazole  [methimazole ]    Review of Systems   Review of Systems  Respiratory:  Positive for cough and shortness of breath.   All other systems reviewed and are negative.   Physical Exam Updated Vital Signs BP (!) 139/90 (BP Location: Right Arm)   Pulse 87   Temp 98.2 F (36.8 C) (Oral)   Resp 20   Ht 5' 3 (1.6 m)   Wt 90.7 kg   SpO2 100%   BMI 35.43 kg/m  Physical Exam Vitals and nursing note reviewed.  Constitutional:      General: She is not in acute distress.    Appearance: She is well-developed.     Comments: Resting comfortably in bed  HENT:     Head: Normocephalic and atraumatic.     Nose: Congestion and rhinorrhea present.  Eyes:     Conjunctiva/sclera: Conjunctivae normal.  Cardiovascular:     Rate and Rhythm: Normal rate and regular rhythm.     Heart sounds: No murmur heard. Pulmonary:     Effort: Pulmonary effort is normal. No respiratory distress.     Breath sounds: Examination of the right-upper field reveals wheezing. Examination of the left-upper field reveals wheezing. Wheezing present. No rales.  Chest:  Chest wall: No tenderness.  Abdominal:     Palpations: Abdomen is soft.     Tenderness: There is no abdominal tenderness.  Musculoskeletal:        General: No swelling.     Cervical back: Neck supple.  Skin:    General: Skin is warm and dry.     Capillary Refill: Capillary refill takes less than 2 seconds.  Neurological:     Mental Status: She is alert.  Psychiatric:        Mood and Affect: Mood normal.     ED Results / Procedures / Treatments   Labs (all labs ordered are listed, but only abnormal results are displayed) Labs Reviewed - No data to display  EKG None  Radiology DG Chest 2 View Result Date: 02/14/2023 CLINICAL DATA:  Cough for 5 days.  Shortness of breath and asthma. EXAM: CHEST - 2  VIEW COMPARISON:  AP chest 05/18/2021 FINDINGS: Cardiac silhouette and mediastinal contours are within normal limits. Mandible horizontal linear scarring within the lingula is similar to prior. No pleural effusion or pneumothorax. No acute skeletal abnormality. IMPRESSION: No active cardiopulmonary disease. Electronically Signed   By: Tanda Lyons M.D.   On: 02/14/2023 15:22    Procedures Procedures    Medications Ordered in ED Medications  albuterol  (VENTOLIN  HFA) 108 (90 Base) MCG/ACT inhaler 1 puff (has no administration in time range)  ipratropium-albuterol  (DUONEB) 0.5-2.5 (3) MG/3ML nebulizer solution 3 mL (3 mLs Nebulization Given 02/14/23 1349)  albuterol  (PROVENTIL ) (2.5 MG/3ML) 0.083% nebulizer solution 2.5 mg (2.5 mg Nebulization Given 02/14/23 1349)  ipratropium-albuterol  (DUONEB) 0.5-2.5 (3) MG/3ML nebulizer solution 3 mL (3 mLs Nebulization Given 02/14/23 1540)  predniSONE  (DELTASONE ) tablet 60 mg (60 mg Oral Given 02/14/23 1540)    ED Course/ Medical Decision Making/ A&P                                 Medical Decision Making Amount and/or Complexity of Data Reviewed Radiology: ordered.  Risk Prescription drug management.  This patient presents to the ED for concern of cough, this involves an extensive number of treatment options, and is a complaint that carries with it a high risk of complications and morbidity. Differential diagnosis for emergent cause of cough includes but is not limited to upper respiratory infection, lower respiratory infection, allergies, asthma, irritants, foreign body, medications such as ACE inhibitors, reflux, asthma, CHF, lung cancer, interstitial lung disease, psychiatric causes, postnasal drip and postinfectious bronchospasm.   My initial workup includes chest x-ray, symptom control  Additional history obtained from: Nursing notes from this visit.  I ordered imaging studies including chest x-ray I independently visualized and interpreted  imaging which showed no acute cardiopulmonary abnormalities I agree with the radiologist interpretation  Afebrile, hemodynamically stable.  48 year old female presenting to the ED for evaluation of cough, congestion, rhinorrhea, shortness of breath.  Symptoms began 5 days ago.  Worsened yesterday while at work.  Was recently negative for flu, COVID and RSV.  No chest pain.  No fevers.  Cough is nonproductive.  She appears well on physical exam.  Does have nasal congestion and bilateral upper lobe wheezing which is fairly mild after a single DuoNeb and single albuterol  nebulizer treatment.  Does have an extensive history of asthma.  An additional DuoNeb treatment and prednisone  was ordered in the emergency department.  She had improvement in her symptoms afterwards.  Suspect asthma exacerbation versus bronchitis.  Will  prescribe steroid burst and refill her albuterol  inhaler.  She was encouraged to follow-up with her primary care provider in 1 week for reevaluation.  She was given return precautions.  Stable at discharge.  At this time there does not appear to be any evidence of an acute emergency medical condition and the patient appears stable for discharge with appropriate outpatient follow up. Diagnosis was discussed with patient who verbalizes understanding of care plan and is agreeable to discharge. I have discussed return precautions with patient who verbalizes understanding. Patient encouraged to follow-up with their PCP within 1 week. All questions answered.  Note: Portions of this report may have been transcribed using voice recognition software. Every effort was made to ensure accuracy; however, inadvertent computerized transcription errors may still be present.         Final Clinical Impression(s) / ED Diagnoses Final diagnoses:  Viral URI  Mild intermittent asthma with exacerbation    Rx / DC Orders ED Discharge Orders          Ordered    predniSONE  (DELTASONE ) 20 MG tablet   Daily with breakfast        02/14/23 1623              Alanna Storti, Marsa HERO, PA-C 02/14/23 1625    Franklyn Sid SAILOR, MD 02/14/23 1735

## 2023-02-14 NOTE — ED Triage Notes (Signed)
 C/o cough and wheezing x 5 days. Negative for viruses. Seen here 1/3. Been taking albuterol inhaler without relief.

## 2023-02-15 NOTE — ED Notes (Signed)
 Patient called today wanting a note to excuse her from court on 02/21/2023 because she is still not feeling well.  I explained to her that we can't give her a note for 5 days out.  I advised she comes back to the emergency room to be seen closer to the date if she is still not feeling well.

## 2023-02-24 ENCOUNTER — Other Ambulatory Visit: Payer: Self-pay

## 2023-02-24 ENCOUNTER — Emergency Department (HOSPITAL_BASED_OUTPATIENT_CLINIC_OR_DEPARTMENT_OTHER)
Admission: EM | Admit: 2023-02-24 | Discharge: 2023-02-24 | Disposition: A | Discharge status: Home / Self Care | Payer: Medicaid Other

## 2023-02-24 DIAGNOSIS — Z20822 Contact with and (suspected) exposure to covid-19: Secondary | ICD-10-CM | POA: Insufficient documentation

## 2023-02-24 DIAGNOSIS — J069 Acute upper respiratory infection, unspecified: Secondary | ICD-10-CM | POA: Insufficient documentation

## 2023-02-24 DIAGNOSIS — J029 Acute pharyngitis, unspecified: Secondary | ICD-10-CM

## 2023-02-24 LAB — RESP PANEL BY RT-PCR (RSV, FLU A&B, COVID)  RVPGX2
Influenza A by PCR: NEGATIVE
Influenza B by PCR: NEGATIVE
Resp Syncytial Virus by PCR: NEGATIVE
SARS Coronavirus 2 by RT PCR: NEGATIVE

## 2023-02-24 LAB — GROUP A STREP BY PCR: Group A Strep by PCR: NOT DETECTED

## 2023-02-24 MED ORDER — BENZONATATE 100 MG PO CAPS: 100.0000 mg | ORAL_CAPSULE | Freq: Three times a day (TID) | ORAL | 0 refills | Status: DC

## 2023-02-24 NOTE — Discharge Instructions (Signed)
I suspect a viral upper respiratory infection has been driving her symptoms.  There are many other viruses other than COVID and flu that we do not test for that can cause similar symptoms.  Your strep test was negative today viruses can commonly cause sore throat as well.  You have a lot of swelling and inflammation in your nose and the drainage from your nose is probably contributing to your continued cough and sore throat.  To help this you can take Sudafed once daily in the morning and you can also use Flonase 2 sprays in each nostril once daily.  These can both be purchased over-the-counter.  You can also use over-the-counter Zyrtec to help reduce swelling and inflammation in the nose.  You can continue using Tylenol as needed for aches and pains or headache.  This will also help with throat pain.  It is not uncommon for cough to last for 4 to 6 weeks after a viral infection if you develop worsening cough, chest pain, shortness of breath or other new or concerning symptoms return to the ED but otherwise follow-up with your primary care provider.

## 2023-02-24 NOTE — ED Triage Notes (Signed)
Pt was here 01/3 for cough. Pt states that she is still coughing. Her throat is hurting and her nose is stuffy. States that she was tested earlier for covid but it was neg.

## 2023-02-24 NOTE — ED Provider Notes (Signed)
[facility] Provider Note   CSN: [id number] Arrival date & time: 02/24/23  1218     History  Chief Complaint  Patient presents with   Sore Throat    Lacey Mccarty is a 48 y.o. female.  Lacey Mccarty is a 48 y.o. female with a history of thyroid disease, who presents to the emergency department for evaluation of continued cough, runny nose and sore throat.  Symptoms started on 1/3.  She reports that she has more energy and has been feeling better but has continued to have a runny nose, congestion a dry cough and sore throat.  She has previously had COVID and flu testing that was negative, with throat starting to bother her more she was worried she may need an antibiotic.  She reports that she has been using the [employer] she was prescribed which helped some with cough.  She is taken Tylenol as needed for pain and has been drinking hot tea but has not used any other over-the-counter medications.  No chest pain or shortness of breath.  No fevers.  The history is provided by the patient and medical records.  Sore Throat Pertinent negatives include no chest pain, no abdominal pain, no headaches and no shortness of breath.       Home Medications Prior to Admission medications   Medication Sig Start Date End Date Taking? Authorizing Provider  albuterol (VENTOLIN HFA) 108 (90 Base) MCG/ACT inhaler Inhale 1-2 puffs into the lungs every 6 (six) hours as needed for wheezing or shortness of breath. 05/18/21   Haskel Schroeder, PA-C  benzonatate (TESSALON) 100 MG capsule Take 1 capsule (100 mg total) by mouth every 8 (eight) hours. 02/11/23   Roemhildt, Lorin T, PA-C  levothyroxine (SYNTHROID) 175 MCG tablet Take 1 tablet (175 mcg total) by mouth daily before breakfast. Patient taking differently: Take 125 mcg by mouth daily before breakfast. 04/28/22   Ivonne Andrew, NP  megestrol (MEGACE) 40 MG tablet Take two tablets three times daily  for 3 days, then two tablets twice daily Patient taking differently: Take 40 mg by mouth 2 (two) times daily. 09/29/22   Lorriane Shire, MD  meloxicam (MOBIC) 7.5 MG tablet Take 1 tablet (7.5 mg total) by mouth daily. Patient not taking: Reported on 09/29/2022 04/28/22   Ivonne Andrew, NP  meloxicam (MOBIC) 7.5 MG tablet Take 1 tablet (7.5 mg total) by mouth daily. 12/01/22   Reva Bores, MD      Allergies    Tapazole [methimazole]    Review of Systems   Review of Systems  Constitutional:  Negative for chills and fever.  HENT:  Positive for congestion, rhinorrhea and sore throat.   Respiratory:  Positive for cough. Negative for shortness of breath.   Cardiovascular:  Negative for chest pain.  Gastrointestinal:  Negative for abdominal pain, diarrhea, nausea and vomiting.  Musculoskeletal:  Negative for myalgias, neck pain and neck stiffness.  Skin:  Negative for color change and rash.  Neurological:  Negative for headaches.  All other systems reviewed and are negative.   Physical Exam Updated Vital Signs BP (!) 165/84   Pulse 70   Temp 98.4 F (36.9 C)   Resp 16   Ht 5\' 3"  (1.6 m)   Wt 90.7 kg   SpO2 100%   BMI 35.42 kg/m  Physical Exam Vitals and nursing note reviewed.  Constitutional:      General: She is not in acute  distress.    Appearance: She is well-developed. She is not ill-appearing or diaphoretic.  HENT:     Head: Normocephalic and atraumatic.     Right Ear: Tympanic membrane and ear canal normal.     Left Ear: Tympanic membrane and ear canal normal.     Nose: Congestion and rhinorrhea present.     Comments: Nasal turbinates edematous and erythematous with clear rhinorrhea present    Mouth/Throat:     Mouth: Mucous membranes are moist.     Pharynx: Oropharynx is clear. Posterior oropharyngeal erythema present. No oropharyngeal exudate.     Tonsils: No tonsillar exudate. 0 on the right. 0 on the left.  Eyes:     General:        Right eye: No  discharge.        Left eye: No discharge.  Neck:     Comments: No rigidity Cardiovascular:     Rate and Rhythm: Normal rate and regular rhythm.     Heart sounds: Normal heart sounds. No murmur heard.    No friction rub. No gallop.  Pulmonary:     Effort: Pulmonary effort is normal. No respiratory distress.     Breath sounds: Normal breath sounds.     Comments: Respirations equal and unlabored, patient able to speak in full sentences, lungs clear to auscultation bilaterally  Abdominal:     General: Bowel sounds are normal. There is no distension.     Palpations: Abdomen is soft. There is no mass.     Tenderness: There is no abdominal tenderness. There is no guarding.     Comments: Abdomen soft, nondistended, nontender to palpation in all quadrants without guarding or peritoneal signs  Musculoskeletal:        General: No deformity.     Cervical back: Neck supple.  Lymphadenopathy:     Cervical: No cervical adenopathy.  Skin:    General: Skin is warm and dry.     Capillary Refill: Capillary refill takes less than 2 seconds.  Neurological:     Mental Status: She is alert and oriented to person, place, and time.  Psychiatric:        Mood and Affect: Mood normal.        Behavior: Behavior normal.     ED Results / Procedures / Treatments   Labs (all labs ordered are listed, but only abnormal results are displayed) Labs Reviewed  RESP PANEL BY RT-PCR (RSV, FLU A&B, COVID)  [id number]  GROUP A STREP BY PCR    EKG None  Radiology No results found.  Procedures Procedures    Medications Ordered in ED Medications - No data to display  ED Course/ Medical Decision Making/ A&P                                 Medical Decision Making Risk Prescription drug management.   Patient presents with ongoing rhinorrhea, sore throat and cough, no fever and patient is well-appearing.  She has been seen multiple times in the ED for this.  Negative strep as well as viral panel testing  today.  I had a long conversation with patient regarding progression of upper respiratory viral illness and that is not uncommon for cough to last for several weeks.  Lungs are clear she is not tachypneic, tachycardic or hypoxic and I have low suspicion for pneumonia.  She does have evidence of postnasal drip which I suspect may be  contributing to her persisting cough and sore throat.  Discussed using Flonase and decongestants to help relieve the symptoms.  Provided with reassurance.  Discharged home in good condition.        Final Clinical Impression(s) / ED Diagnoses Final diagnoses:  Viral URI with cough  Sore throat    Rx / DC Orders ED Discharge Orders          Ordered    benzonatate (TESSALON) 100 MG capsule  Every 8 hours        02/24/23 1412              Rosezella Rumpf, PA-C 02/24/23 1638    Durwin Glaze, MD 02/25/23 [phone]

## 2023-03-24 ENCOUNTER — Encounter: Payer: Self-pay | Admitting: Family Medicine

## 2023-03-25 ENCOUNTER — Encounter: Payer: Self-pay | Admitting: *Deleted

## 2023-05-31 IMAGING — CR DG ANKLE COMPLETE 3+V*R*
3 series · 3 of 3 positions shown · non-contrast
Comparison: None.

CLINICAL DATA: Right ankle injury

EXAM:
RIGHT ANKLE - COMPLETE 3+ VIEW

[t ankle joint ap right]
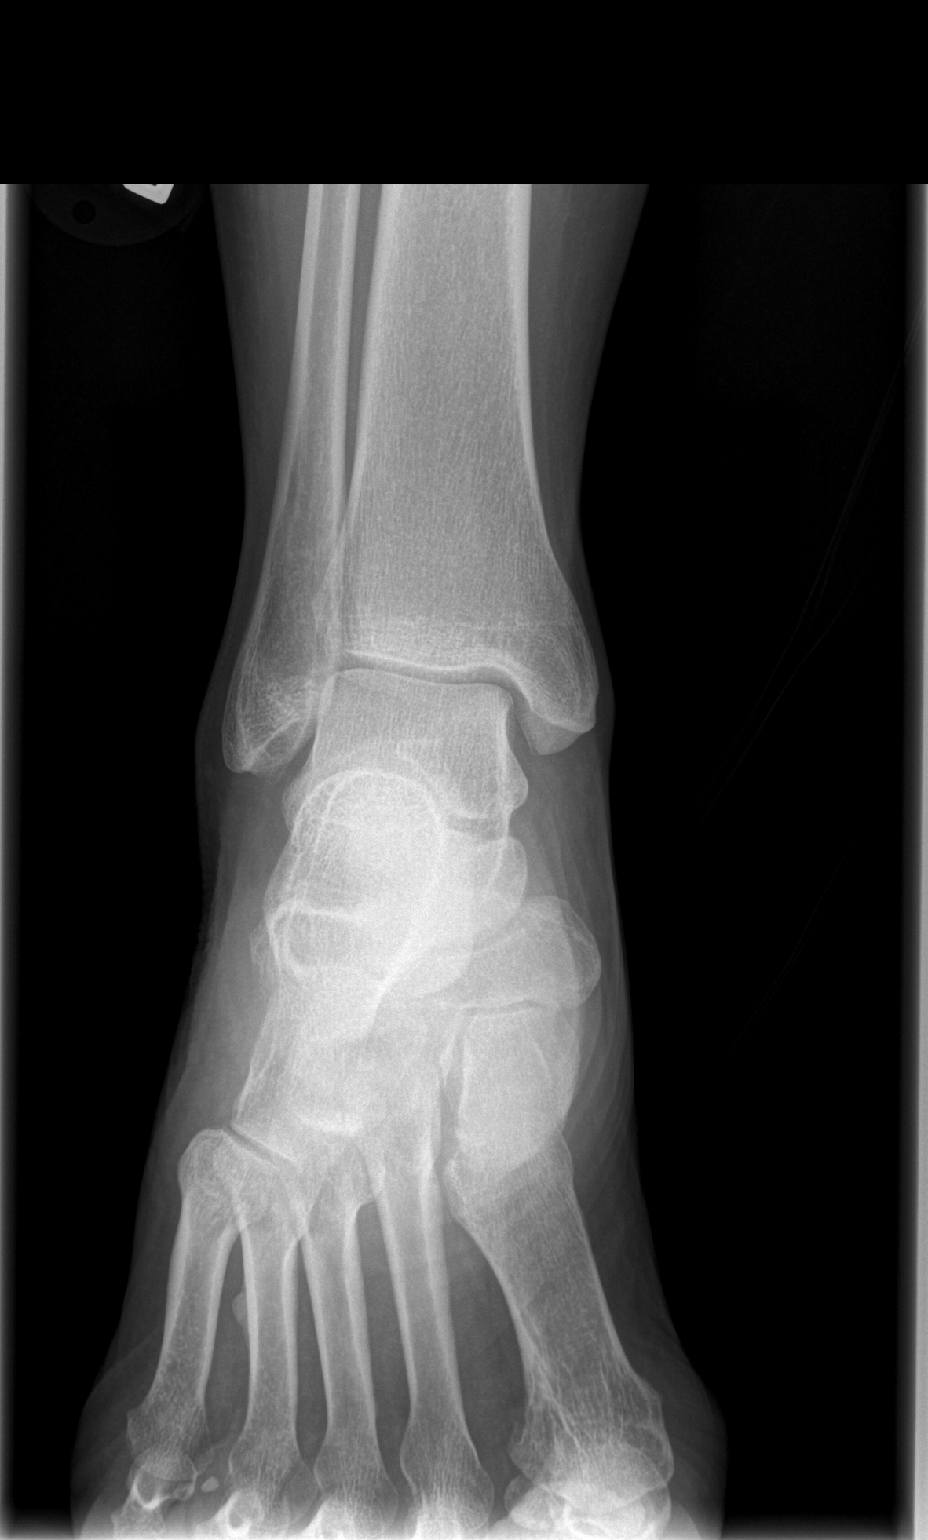

[t ankle joint oblique right]
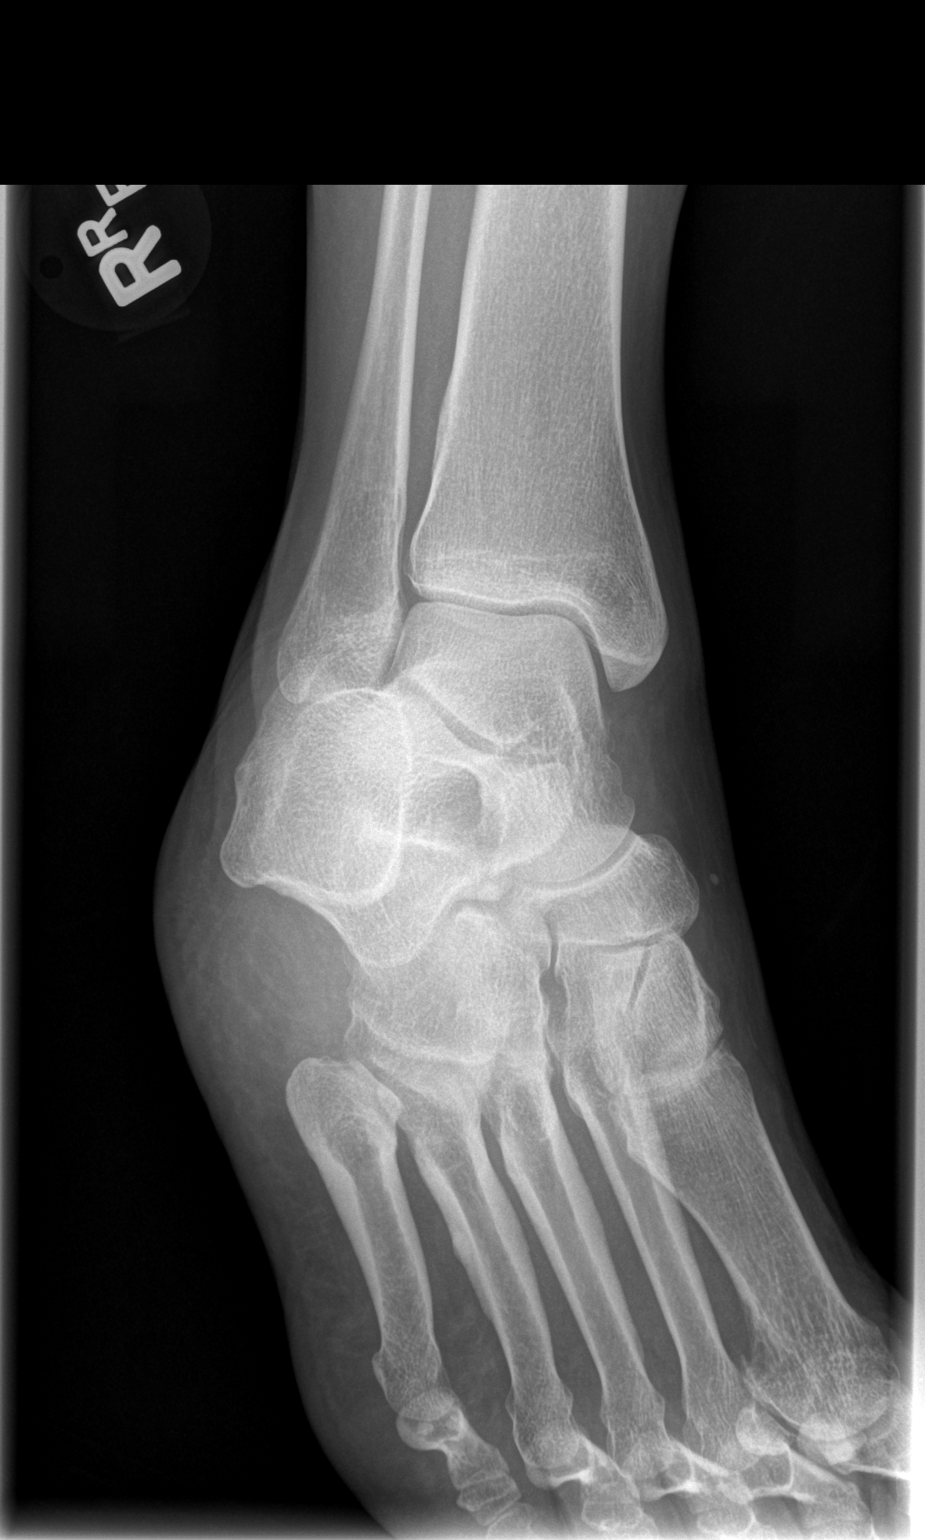

[t ankle joint lat right]
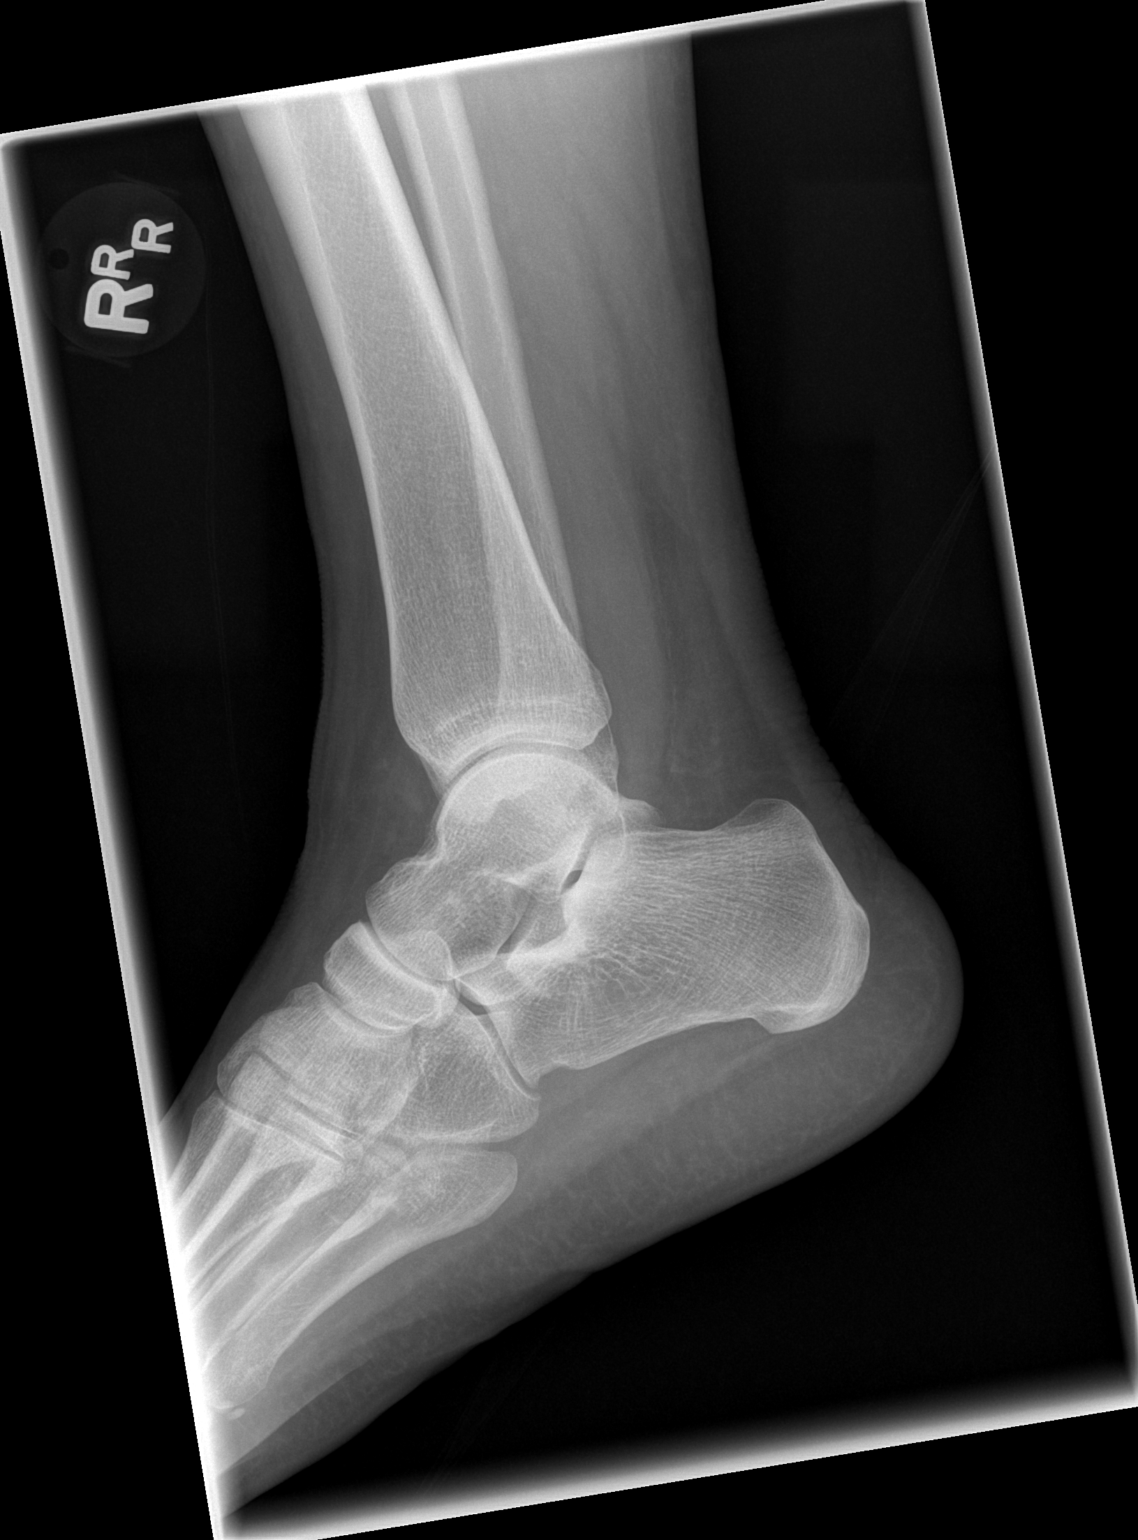

[3 of 3 positions shown; findings below may reference images not displayed]

FINDINGS: There is no evidence of acute fracture or dislocation. No
significant arthritis. Soft tissues are unremarkable.
IMPRESSION: No evidence of acute fracture.

## 2023-06-09 ENCOUNTER — Encounter: Payer: Self-pay | Admitting: Family Medicine

## 2023-07-05 ENCOUNTER — Encounter (HOSPITAL_BASED_OUTPATIENT_CLINIC_OR_DEPARTMENT_OTHER): Payer: Self-pay | Admitting: Urology

## 2023-07-05 ENCOUNTER — Emergency Department (HOSPITAL_BASED_OUTPATIENT_CLINIC_OR_DEPARTMENT_OTHER)
Admission: EM | Admit: 2023-07-05 | Discharge: 2023-07-05 | Disposition: A | Discharge status: Home / Self Care | Attending: Emergency Medicine | Admitting: Emergency Medicine

## 2023-07-05 ENCOUNTER — Other Ambulatory Visit: Payer: Self-pay

## 2023-07-05 DIAGNOSIS — J45909 Unspecified asthma, uncomplicated: Secondary | ICD-10-CM | POA: Diagnosis not present

## 2023-07-05 DIAGNOSIS — R202 Paresthesia of skin: Secondary | ICD-10-CM | POA: Diagnosis not present

## 2023-07-05 DIAGNOSIS — E039 Hypothyroidism, unspecified: Secondary | ICD-10-CM | POA: Insufficient documentation

## 2023-07-05 DIAGNOSIS — B9689 Other specified bacterial agents as the cause of diseases classified elsewhere: Secondary | ICD-10-CM | POA: Insufficient documentation

## 2023-07-05 DIAGNOSIS — N939 Abnormal uterine and vaginal bleeding, unspecified: Secondary | ICD-10-CM | POA: Diagnosis present

## 2023-07-05 DIAGNOSIS — N760 Acute vaginitis: Secondary | ICD-10-CM | POA: Diagnosis not present

## 2023-07-05 DIAGNOSIS — M79644 Pain in right finger(s): Secondary | ICD-10-CM | POA: Diagnosis not present

## 2023-07-05 LAB — URINALYSIS, W/ REFLEX TO CULTURE (INFECTION SUSPECTED)
Bilirubin Urine: NEGATIVE
Glucose, UA: NEGATIVE mg/dL
Ketones, ur: NEGATIVE mg/dL
Leukocytes,Ua: NEGATIVE
Nitrite: NEGATIVE
Protein, ur: NEGATIVE mg/dL
RBC / HPF: NONE SEEN RBC/hpf (ref 0–5)
Specific Gravity, Urine: >=1.03 (ref 1.005–1.030)
pH: 6.5 (ref 5.0–8.0)

## 2023-07-05 LAB — CBC WITH DIFFERENTIAL/PLATELET
Abs Immature Granulocytes: 0 10*3/uL (ref 0.00–0.07)
Basophils Absolute: 0 10*3/uL (ref 0.0–0.1)
Basophils Relative: 1 %
Eosinophils Absolute: 0 10*3/uL (ref 0.0–0.5)
Eosinophils Relative: 1 %
HCT: 32.8 % — ABNORMAL LOW (ref 36.0–46.0)
Hemoglobin: 10.7 g/dL — ABNORMAL LOW (ref 12.0–15.0)
Immature Granulocytes: 0 %
Lymphocytes Relative: 41 %
Lymphs Abs: 1.2 10*3/uL (ref 0.7–4.0)
MCH: 29.6 pg (ref 26.0–34.0)
MCHC: 32.6 g/dL (ref 30.0–36.0)
MCV: 90.6 fL (ref 80.0–100.0)
Monocytes Absolute: 0.2 10*3/uL (ref 0.1–1.0)
Monocytes Relative: 8 %
Neutro Abs: 1.4 10*3/uL — ABNORMAL LOW (ref 1.7–7.7)
Neutrophils Relative %: 49 %
Platelets: 201 10*3/uL (ref 150–400)
RBC: 3.62 MIL/uL — ABNORMAL LOW (ref 3.87–5.11)
RDW: 17.8 % — ABNORMAL HIGH (ref 11.5–15.5)
WBC: 2.8 10*3/uL — ABNORMAL LOW (ref 4.0–10.5)
nRBC: 0 % (ref 0.0–0.2)

## 2023-07-05 LAB — BASIC METABOLIC PANEL WITH GFR
Anion gap: 11 (ref 5–15)
BUN: 17 mg/dL (ref 6–20)
CO2: 24 mmol/L (ref 22–32)
Calcium: 8.9 mg/dL (ref 8.9–10.3)
Chloride: 106 mmol/L (ref 98–111)
Creatinine, Ser: 0.96 mg/dL (ref 0.44–1.00)
GFR, Estimated: >60 mL/min (ref >60)
Glucose, Bld: 106 mg/dL — ABNORMAL HIGH (ref 70–99)
Potassium: 3.7 mmol/L (ref 3.5–5.1)
Sodium: 141 mmol/L (ref 135–145)

## 2023-07-05 LAB — WET PREP, GENITAL
Sperm: NONE SEEN
Trich, Wet Prep: NONE SEEN
WBC, Wet Prep HPF POC: <10 (ref <10)
Yeast Wet Prep HPF POC: NONE SEEN

## 2023-07-05 MED ORDER — METRONIDAZOLE 500 MG PO TABS: 500.0000 mg | ORAL_TABLET | Freq: Two times a day (BID) | ORAL | 0 refills | Status: AC

## 2023-07-05 MED ORDER — FLUCONAZOLE 200 MG PO TABS: 200.0000 mg | ORAL_TABLET | Freq: Every day | ORAL | 0 refills | Status: AC

## 2023-07-05 NOTE — Discharge Instructions (Signed)
 You were seen in the emerged part today and diagnosed with bacterial vaginosis.  Please take the antibiotic as prescribed.  Do not drink alcohol while taking this medicine as it can make you very sick.  We have also called in a pill in case you develop yeast infection symptoms after taking the antibiotic.  Please keep your follow-up appointment with your GYN next week as scheduled.  As we discussed, some of your other symptoms would be best addressed by your primary care doctor.  Please call your PCP today for the soonest available follow-up appointment.

## 2023-07-05 NOTE — ED Provider Notes (Signed)
 Emergency Department Provider Note   I have reviewed the triage vital signs and the nursing notes.   HISTORY  Chief Complaint Multiple complaints    HPI Lacey Mccarty is a 48 y.o. female with past history reviewed below presents the emergency department for evaluation of vaginal spotting with an odor.  She is not experiencing any discharge.  No dysuria, hesitancy, urgency she is not experiencing any abdominal or pelvic pain.  She had some abnormal uterine bleeding last year and underwent D&C with IUD placement at that time for bleeding management.  She is not currently sexually active.  She has not had any bleeding since the occasional spotting which began a couple of days ago.  She tried to schedule a follow-up appointment with both her GYN and PCP who could not accommodate her schedule and so presents here for evaluation.  Patient also mentions a slightly swollen/tender area to the right middle finger.  She states this has been ongoing for a Lacey Mccarty time but has not addressed it as of yet.  No injury.  She also reports occasional tingling/numb feeling in both hands and both feet.  Symptoms are worse at night.  No pain with walking or movement.  No injuries.   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 Respiratory: Denies shortness of breath. Gastrointestinal: No abdominal pain.  No nausea, no vomiting.  No diarrhea.  No constipation. Genitourinary: Negative for dysuria. Positive vaginal spotting and a foul odor.  Musculoskeletal: Right middle finger pain (chronic).  Skin: Negative for rash. Neurological: Negative for headaches. Tingling in the bilateral hands  and feet.  ____________________________________________   PHYSICAL EXAM:  VITAL SIGNS: ED Triage Vitals  Encounter Vitals Group     BP 07/05/23 0955 (!) 165/103     Pulse Rate 07/05/23 0955 81     Resp 07/05/23 0955 20     Temp 07/05/23 0955 (!) 97 F (36.1 C)     Temp src --      SpO2 07/05/23 0955 99 %     Weight 07/05/23 0953 199 lb 15.3 oz (90.7 kg)     Height 07/05/23 0953 5\' 3"  (1.6 m)   Constitutional: Alert and oriented. Well appearing and in no acute distress. Eyes: Conjunctivae are normal.  Head: Atraumatic. Nose: No congestion/rhinnorhea. Mouth/Throat: Mucous membranes are moist.   Neck: No stridor.   Cardiovascular:  Good peripheral circulation.  Respiratory: Normal respiratory effort.   Gastrointestinal: No distention.  Musculoskeletal: No lower extremity tenderness nor edema. No gross deformities of extremities.  Small area of tenderness along the right distal Lacey Mccarty finger.  No erythema.  Normal range of motion.  Normal strength.  No tenderness into the palm or forearm.  Neurologic:  Normal speech and language. No gross focal neurologic deficits are appreciated.  Skin:  Skin is warm, dry and intact. No rash noted.  ____________________________________________   LABS (all labs ordered are listed, but only abnormal results are displayed)  Labs Reviewed  WET PREP, GENITAL - Abnormal; Notable for the following components:      Result Value   Clue Cells Wet Prep HPF POC PRESENT (*)  All other components within normal limits  URINALYSIS, W/ REFLEX TO CULTURE (INFECTION SUSPECTED) - Abnormal; Notable for the following components:   Hgb urine dipstick TRACE (*)    Bacteria, UA RARE (*)    All other components within normal limits  BASIC METABOLIC PANEL WITH GFR - Abnormal; Notable for the following components:   Glucose, Bld 106 (*)    All other components within normal limits  CBC WITH DIFFERENTIAL/PLATELET - Abnormal; Notable for the following components:    WBC 2.8 (*)    RBC 3.62 (*)    Hemoglobin 10.7 (*)    HCT 32.8 (*)    RDW 17.8 (*)    Neutro Abs 1.4 (*)    All other components within normal limits  GC/CHLAMYDIA PROBE AMP ([place]) NOT AT [facility]   ____________________________________________   PROCEDURES  Procedure(s) performed:   Procedures  None ____________________________________________   INITIAL IMPRESSION / ASSESSMENT AND PLAN / ED COURSE  Pertinent labs & imaging results that were available during my care of the patient were reviewed by me and considered in my medical decision making (see chart for details).   This patient is Presenting for Evaluation of vaginal odor/spotting, which does require a range of treatment options, and is a complaint that involves a moderate risk of morbidity and mortality.  The Differential Diagnoses include vaginitis, cervicitis, dysmenorrhea, anemia, electrolyte disturbance, etc.  I decided to review pertinent External Data, and in summary IUD placed last year for abnormal uterine bleeding.    Clinical Laboratory Tests Ordered, included Wet prep with clue cells. BMP without AKI. No UTI. CBC without severe anemia.   Radiologic Tests: no abdominal/pelvis pain to prompt imaging on an emergent basis.   Cardiac Monitor Tracing which shows NSR.    Social Determinants of Health Risk patient has a smoking history.   Medical Decision Making: Summary:  Patient presents emergency department for evaluation of vaginal spotting with odor.  No discharge.  No pelvic/abdominal/back pain.  Suspect breakthrough bleeding with IUD in place.  Plan for screening blood work given other symptoms including some fatigue and tingling/pins-and-needles in the hands and feet.  No known history of diabetes.  Will obtain chemistry and CBC.  Plan for wet prep and GC chlamydia swab along with UA as well.  Patient asking about routine thyroid  testing.  I do not appreciate any findings that would lead me to suspect  thyroid  emergency.  Discussed that I would defer this to the outpatient setting with her PCP. Tenderness in the middle finger is chronic with reassuring exam here. No injury or sudden change. Suspect arthritis clinically. I do not see a clear indication for emergent imaging of the hand/finger.   Reevaluation with update and discussion with patient. Plan for Flagyl and Gyn follow up. Advised close PCP follow up for routine labs/health screening. Patient to call today.   Patient's presentation is most consistent with acute, uncomplicated illness.   Disposition: discharge   ____________________________________________  FINAL CLINICAL IMPRESSION(S) / ED DIAGNOSES  Final diagnoses:  Bacterial vaginosis     NEW OUTPATIENT MEDICATIONS STARTED DURING THIS VISIT:  Discharge Medication List as of 07/05/2023 11:25 AM     START taking these medications   Details  fluconazole (DIFLUCAN) 200 MG tablet Take 1 tablet (200 mg total) by mouth daily for 2 days., Starting Tue 07/05/2023, Until Thu 07/07/2023, Normal    metroNIDAZOLE (FLAGYL) 500 MG tablet Take 1 tablet (500 mg total) by mouth 2 (two) times daily for 7 days.,  Starting Tue 07/05/2023, Until Tue 07/12/2023, Normal        Note:  This document was prepared using Dragon voice recognition software and may include unintentional dictation errors.  Abby Hocking, MD, [facility]    Kimbella Heisler, Shereen Dike, MD 07/06/23 [phone]

## 2023-07-05 NOTE — ED Triage Notes (Signed)
 Pt states started having vaginal spotting 2 weeks ago and concern for IUD, also states foul odor, not sexually active  Also states knot to right middle finger for a couple months, pain when she presses it  States fingers feel numb and has poor circulation in toes as well     H/o thyroid  issues

## 2023-07-06 LAB — GC/CHLAMYDIA PROBE AMP (~~LOC~~) NOT AT ARMC
Chlamydia: NEGATIVE
Comment: NEGATIVE
Comment: NORMAL
Neisseria Gonorrhea: NEGATIVE

## 2023-07-08 ENCOUNTER — Ambulatory Visit: Payer: Self-pay

## 2023-07-08 ENCOUNTER — Encounter: Payer: Self-pay | Admitting: Obstetrics & Gynecology

## 2023-07-08 ENCOUNTER — Other Ambulatory Visit: Payer: Self-pay | Admitting: Nurse Practitioner

## 2023-07-08 ENCOUNTER — Ambulatory Visit: Admitting: Nurse Practitioner

## 2023-07-08 ENCOUNTER — Telehealth: Payer: Self-pay

## 2023-07-08 ENCOUNTER — Encounter: Payer: Self-pay | Admitting: Nurse Practitioner

## 2023-07-08 VITALS — BP 135/86 | HR 71 | Temp 97.0°F | Wt 211.0 lb

## 2023-07-08 DIAGNOSIS — R200 Anesthesia of skin: Secondary | ICD-10-CM

## 2023-07-08 DIAGNOSIS — R202 Paresthesia of skin: Secondary | ICD-10-CM

## 2023-07-08 DIAGNOSIS — E890 Postprocedural hypothyroidism: Secondary | ICD-10-CM | POA: Diagnosis not present

## 2023-07-08 DIAGNOSIS — M1852 Other unilateral secondary osteoarthritis of first carpometacarpal joint, left hand: Secondary | ICD-10-CM

## 2023-07-08 DIAGNOSIS — Z09 Encounter for follow-up examination after completed treatment for conditions other than malignant neoplasm: Secondary | ICD-10-CM | POA: Insufficient documentation

## 2023-07-08 DIAGNOSIS — D649 Anemia, unspecified: Secondary | ICD-10-CM

## 2023-07-08 DIAGNOSIS — N760 Acute vaginitis: Secondary | ICD-10-CM

## 2023-07-08 DIAGNOSIS — B9689 Other specified bacterial agents as the cause of diseases classified elsewhere: Secondary | ICD-10-CM

## 2023-07-08 MED ORDER — PREDNISONE 10 MG (21) PO TBPK: ORAL_TABLET | ORAL | 0 refills | Status: AC

## 2023-07-08 MED ORDER — METHYLPREDNISOLONE NA SUC (PF) 40 MG IJ SOLR
40.0000 mg | Freq: Once | INTRAMUSCULAR | Status: AC
  Administered 2023-07-08: 40 mg via INTRAMUSCULAR

## 2023-07-08 MED ORDER — LEVOTHYROXINE SODIUM 175 MCG PO TABS: 175.0000 ug | ORAL_TABLET | Freq: Every day | ORAL | 1 refills | Status: AC

## 2023-07-08 MED ORDER — KETOROLAC TROMETHAMINE 30 MG/ML IJ SOLN
30.0000 mg | Freq: Once | INTRAMUSCULAR | Status: AC
  Administered 2023-07-08: 30 mg via INTRAMUSCULAR

## 2023-07-08 NOTE — Assessment & Plan Note (Signed)
 Pain is currently rated 9/10, she is established with orthopedics Encouraged to take prednisone  with food, avoid taking ibuprofen , Aleve  while taking prednisone  Follow-up with orthopedics - predniSONE  (STERAPRED UNI-PAK 21 TAB) 10 MG (21) TBPK tablet; Take with food and as instructed on the packaging  Dispense: 1 each; Refill: 0 - ketorolac  (TORADOL ) 30 MG/ML injection 30 mg - methylPREDNISolone  sodium succinate (SOLU-MEDROL ) 40 MG injection 40 mg

## 2023-07-08 NOTE — Progress Notes (Deleted)
   Acute Office Visit  Subjective:     Patient ID: Lacey Mccarty, female    DOB: [birth year], 48 y.o.   MRN: [id number]  Chief Complaint  Patient presents with   Hypertension    HPI Patient is in today for     Numbnes on the fingers and toes 2-3 , base of left thumb   Blurry vision , saw opthamologist 2 months ago , has new glasesses will pick upo tomorrow.     IUD in place for bleeding, spotting felle fatigued    Ran out 3 days , was taking 150 mcg fro her friedns     Pain is 7/10   Review of Systems      Objective:    BP 135/86   Pulse 71   Temp (!) 97 F (36.1 C)   Wt 211 lb (95.7 kg)   SpO2 99%   BMI 37.38 kg/m  {Vitals History (Optional):23777}  Physical Exam  No results found for any visits on 07/08/23.      Assessment & Plan:   Problem List Items Addressed This Visit   None   No orders of the defined types were placed in this encounter.   No follow-ups on file.  Charon Akamine R Jamilynn Whitacre, FNP

## 2023-07-08 NOTE — Assessment & Plan Note (Signed)
 Hospital chart reviewed, including discharge summary Medications reconciled and reviewed with the patient in detail

## 2023-07-08 NOTE — Assessment & Plan Note (Signed)
-   Iron, TIBC and Ferritin Panel - Vitamin B12

## 2023-07-08 NOTE — Transitions of Care (Post Inpatient/ED Visit) (Signed)
   07/08/2023  Name: Lacey Mccarty MRN: [id number] DOB: [birth year]  Today's TOC FU Call Status: Today's TOC FU Call Status:: Unsuccessful Call (1st Attempt) Unsuccessful Call (1st Attempt) Date: 07/08/23  Attempted to reach the patient regarding the most recent Inpatient/ED visit.  Follow Up Plan: Additional outreach attempts will be made to reach the patient to complete the Transitions of Care (Post Inpatient/ED visit) call.   Signature  [employer], [place]

## 2023-07-08 NOTE — Assessment & Plan Note (Signed)
 Lab Results  Component Value Date   TSH 78.000 (H) 04/28/2022   - TSH + free T4 - levothyroxine  (SYNTHROID ) 175 MCG tablet; Take 1 tablet (175 mcg total) by mouth daily before breakfast.  Dispense: 30 tablet; Refill: 1

## 2023-07-08 NOTE — Patient Instructions (Addendum)
 [facility] [phone]  You were given Toradol  30 mg injection and Solu-Medrol  40 mg injection in the office today.  Please take prednisone  as ordered, take medication with food.  Do not take Aleve  or ibuprofen  while taking prednisone .  1. Other secondary osteoarthritis of first carpometacarpal joint of left hand (Primary)  - predniSONE  (STERAPRED UNI-PAK 21 TAB) 10 MG (21) TBPK tablet; Take with food and as instructed on the packaging  Dispense: 1 each; Refill: 0  2. Anemia, unspecified type  - Iron, TIBC and Ferritin Panel - Vitamin B12  3. Hypothyroidism following radioiodine therapy  - TSH + free T4 - levothyroxine  (SYNTHROID ) 175 MCG tablet; Take 1 tablet (175 mcg total) by mouth daily before breakfast.  Dispense: 30 tablet; Refill: 1     Around 3 times per week, check your blood pressure 2 times per day. once in the morning and once in the evening. The readings should be at least one minute apart. Write down these values and bring them to your next nurse visit/appointment.  When you check your BP, make sure you have been doing something calm/relaxing 5 minutes prior to checking. Both feet should be flat on the floor and you should be sitting. Use your left arm and make sure it is in a relaxed position (on a table), and that the cuff is at the approximate level/height of your heart.     It is important that you exercise regularly at least 30 minutes 5 times a week as tolerated  Think about what you will eat, plan ahead. Choose " clean, green, fresh or frozen" over canned, processed or packaged foods which are more sugary, salty and fatty. 70 to 75% of food eaten should be vegetables and fruit. Three meals at set times with snacks allowed between meals, but they must be fruit or vegetables. Aim to eat over a 12 hour period , example 7 am to 7 pm, and STOP after  your last meal of the day. Drink water,generally about 64 ounces per  day, no other drink is as healthy. Fruit juice is best enjoyed in a healthy way, by EATING the fruit.  Thanks for choosing [facility] we consider it a privelige to serve you.

## 2023-07-08 NOTE — Assessment & Plan Note (Signed)
 Lab Results  Component Value Date   WBC 2.8 (L) 07/05/2023   HGB 10.7 (L) 07/05/2023   HCT 32.8 (L) 07/05/2023   MCV 90.6 07/05/2023   PLT 201 07/05/2023   - Iron, TIBC and Ferritin Panel - Vitamin B12

## 2023-07-08 NOTE — Telephone Encounter (Signed)
 Copied from CRM [id number]. Topic: Clinical - Red Word Triage >> Jul 08, 2023  9:10 AM Baldomero Bone wrote: Red Word that prompted transfer to Nurse Triage: [facility] [place] [facility] for gynecology issues, numbness in toes and fingers for a while now. Blood pressure was 165/105 in the ED. Callback number is [phone]    Chief Complaint: BP 165/105 2 days ago in ED. No history of high BP. Symptoms: above,tingling in fingers and toes Frequency: this week Pertinent Negatives: Patient denies chest pain Disposition: [] ED /[] Urgent Care (no appt availability in office) / [x] Appointment(In office/virtual)/ []  [facility]/ [] Home Care/ [] Refused Recommended Disposition /[] [facility]/ []  Follow-up with PCP Additional Notes: agrees with appointment.  Reason for Disposition  Systolic BP  >= 180 OR Diastolic >= 110  Answer Assessment - Initial Assessment Questions 1. BLOOD PRESSURE: "What is the blood pressure?" "Did you take at least two measurements 5 minutes apart?"     165/105 2. ONSET: "When did you take your blood pressure?"     2 Days ago 3. HOW: "How did you take your blood pressure?" (e.g., automatic home BP monitor, visiting nurse)     Nurse - ED 4. HISTORY: "Do you have a history of high blood pressure?"     NO 5. MEDICINES: "Are you taking any medicines for blood pressure?" "Have you missed any doses recently?"     NO 6. OTHER SYMPTOMS: "Do you have any symptoms?" (e.g., blurred vision, chest pain, difficulty breathing, headache, weakness)     NO 7. PREGNANCY: "Is there any chance you are pregnant?" "When was your last menstrual period?"     NO  Protocols used: Blood Pressure - High-A-AH

## 2023-07-08 NOTE — Assessment & Plan Note (Signed)
 Continue Flagyl 500 mg twice daily as ordered

## 2023-07-08 NOTE — Progress Notes (Signed)
 Established Patient Office Visit  Subjective:  Patient ID: Lacey Mccarty, female    DOB: Jun 22, 1975  Age: 48 y.o. MRN: [id number]  CC:  Chief Complaint  Patient presents with   Hypertension    HPI Lacey Mccarty is a 48 y.o. 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  Patient presents for follow-up for her chronic medical conditions and follow up for hospitalization. Patient was last seen at this office over a year ago  She was at the emergency department on 07/05/2023 for complaints of right middle finger pain, occasional tingling/numb feeling in both hands and both feet symptoms worse at night, vaginal spotting with odor. today she reports occasional numbness, tingling sensation on her fingertips and tips of her toes, also has chronic pain below her left thumb.  She was prescribed Flagyl and fluconazole for BVand recommended to follow-up with PCP for routine labs and health screening  Abnormal uterine bleeding had abnormal uterine bleeding last year and underwent D&C with IUD placement for bleeding management, stated that she has had no bleeding in months.  Hypothyroidism.  She has been out of her levothyroxine , she has been using her friend's levothyroxine , not sure of the dosage.  She reports fatigue no palpitation  Chronic blurry vision.  Saw ophthalmologist 2 months ago they have prescribed new glasses for her she plans to pick up the glasses tomorrow        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    Past Surgical History:  Procedure Laterality  Date   CESAREAN SECTION     HYSTEROSCOPY WITH D & C N/A 11/30/2022   Procedure: DILATATION AND CURETTAGE /HYSTEROSCOPY  WITH PAP SMEAR;  Surgeon: Granville Layer, MD;  Location: [facility] OR;  Service: Gynecology;  Laterality: N/A;   INTRAUTERINE DEVICE (IUD) INSERTION N/A 11/30/2022   Procedure: INTRAUTERINE DEVICE (IUD) INSERTION;  Surgeon: Granville Layer, MD;  Location[facility] OR;  Service: Gynecology;  Laterality: N/A;   LIPOSUCTION     MICROLARYNGOSCOPY Right 07/09/2021   Procedure: DIRECT MICROLARYNGOSCOPY WITH EXCISION VOCAL CORD POLYP;  Surgeon: Juengel, Paul, MD;  Location: [facility];  Service: ENT;  Laterality: Right;   NM THYROID  STIM SUPRESS      Family History  Problem Relation Age of Onset   HIV Mother    Diabetes Mellitus II Mother    Esophageal cancer Father    Hypertension Father    Cancer Father        Lung   Breast cancer Maternal Aunt    Asthma Maternal Aunt    Colon cancer Paternal Grandmother    Stomach cancer Paternal Grandmother    Allergies Child    Allergies Child    Rectal cancer Neg Hx     Social History   Socioeconomic History   Marital status: Single    Spouse name: Not on file   Number of children: Not on file   Years of education: Not on file   Highest education level: Not on file  Occupational History   Not on file  Tobacco  Use   Smoking status: Some Days    Current packs/day: 0.25    Average packs/day: 0.3 packs/day for 15.0 years (3.8 ttl pk-yrs)    Types: Cigarettes   Smokeless tobacco: Never   Tobacco comments:    Started smoking 2007  Vaping Use   Vaping status: Never Used  Substance and Sexual Activity   Alcohol use: Yes    Comment: occasional   Drug use: No   Sexual activity: Yes  Other Topics Concern   Not on file  Social History Narrative   ** Merged History Encounter **      Regular exercise: no   Caffeine use: none       Social Drivers of Corporate investment banker Strain: Not on file  Food Insecurity: Not on  file  Transportation Needs: Not on file  Physical Activity: Not on file  Stress: Not on file  Social Connections: Not on file  Intimate Partner Violence: Not on file    Outpatient Medications Prior to Visit  Medication Sig Dispense Refill   albuterol  (VENTOLIN  HFA) 108 (90 Base) MCG/ACT inhaler Inhale 1-2 puffs into the lungs every 6 (six) hours as needed for wheezing or shortness of breath. 8 g 0   metroNIDAZOLE (FLAGYL) 500 MG tablet Take 1 tablet (500 mg total) by mouth 2 (two) times daily for 7 days. 14 tablet 0   levothyroxine  (SYNTHROID ) 175 MCG tablet Take 1 tablet (175 mcg total) by mouth daily before breakfast. (Patient taking differently: Take 125 mcg by mouth daily before breakfast.) 30 tablet 0   megestrol  (MEGACE ) 40 MG tablet Take two tablets three times daily for 3 days, then two tablets twice daily (Patient not taking: Reported on 07/08/2023) 90 tablet 3   meloxicam  (MOBIC ) 7.5 MG tablet Take 1 tablet (7.5 mg total) by mouth daily. (Patient not taking: Reported on 07/08/2023) 10 tablet 2   benzonatate  (TESSALON ) 100 MG capsule Take 1 capsule (100 mg total) by mouth every 8 (eight) hours. (Patient not taking: Reported on 07/08/2023) 21 capsule 0   meloxicam  (MOBIC ) 7.5 MG tablet Take 1 tablet (7.5 mg total) by mouth daily. (Patient not taking: Reported on 09/29/2022) 30 tablet 0   No facility-administered medications prior to visit.    Allergies  Allergen Reactions   Tapazole  [Methimazole ] Hives and Shortness Of Breath    ROS Review of Systems  Constitutional:  Positive for fatigue. Negative for appetite change, chills and fever.  HENT:  Negative for congestion, postnasal drip, rhinorrhea and sneezing.   Respiratory:  Negative for cough, shortness of breath and wheezing.   Cardiovascular:  Negative for chest pain, palpitations and leg swelling.  Gastrointestinal:  Negative for abdominal pain, constipation, nausea and vomiting.  Genitourinary:  Negative for difficulty  urinating, dysuria, flank pain and frequency.  Musculoskeletal:  Positive for arthralgias. Negative for back pain, joint swelling and myalgias.  Skin:  Negative for color change, pallor, rash and wound.  Neurological:  Negative for dizziness, facial asymmetry, weakness, numbness and headaches.  Psychiatric/Behavioral:  Negative for behavioral problems, confusion, self-injury and suicidal ideas.       Objective:     Physical Exam Vitals and nursing note reviewed.  Constitutional:      General: She is not in acute distress.    Appearance: Normal appearance. She is obese. She is not ill-appearing, toxic-appearing or diaphoretic.  Eyes:     General: No scleral icterus.       Right eye: No discharge.  Left eye: No discharge.     Extraocular Movements: Extraocular movements intact.     Conjunctiva/sclera: Conjunctivae normal.  Cardiovascular:     Rate and Rhythm: Normal rate and regular rhythm.     Pulses: Normal pulses.     Heart sounds: Normal heart sounds. No murmur heard.    No friction rub. No gallop.  Pulmonary:     Effort: Pulmonary effort is normal. No respiratory distress.     Breath sounds: Normal breath sounds. No stridor. No wheezing, rhonchi or rales.  Chest:     Chest wall: No tenderness.  Abdominal:     General: There is no distension.     Palpations: Abdomen is soft.     Tenderness: There is no abdominal tenderness. There is no right CVA tenderness, left CVA tenderness or guarding.  Musculoskeletal:        General: Tenderness present. No swelling, deformity or signs of injury.     Right lower leg: No edema.     Left lower leg: No edema.     Comments: Tenderness on palpation of the first carpometacarpal joint on the right, no redness or swelling noted.  Has palpable radial pulses  Skin:    General: Skin is warm and dry.     Capillary Refill: Capillary refill takes less than 2 seconds.     Coloration: Skin is not jaundiced or pale.     Findings: No bruising,  erythema or lesion.  Neurological:     Mental Status: She is alert and oriented to person, place, and time.     Motor: No weakness.     Coordination: Coordination normal.     Gait: Gait normal.  Psychiatric:        Mood and Affect: Mood normal.        Behavior: Behavior normal.        Thought Content: Thought content normal.        Judgment: Judgment normal.     BP 135/86   Pulse 71   Temp (!) 97 F (36.1 C)   Wt 211 lb (95.7 kg)   SpO2 99%   BMI 37.38 kg/m  Wt Readings from Last 3 Encounters:  07/08/23 211 lb (95.7 kg)  07/05/23 199 lb 15.3 oz (90.7 kg)  02/24/23 199 lb 15.3 oz (90.7 kg)    Lab Results  Component Value Date   TSH 78.000 (H) 04/28/2022   Lab Results  Component Value Date   WBC 2.8 (L) 07/05/2023   HGB 10.7 (L) 07/05/2023   HCT 32.8 (L) 07/05/2023   MCV 90.6 07/05/2023   PLT 201 07/05/2023   Lab Results  Component Value Date   NA 141 07/05/2023   K 3.7 07/05/2023   CO2 24 07/05/2023   GLUCOSE 106 (H) 07/05/2023   BUN 17 07/05/2023   CREATININE 0.96 07/05/2023   BILITOT 0.5 04/28/2022   ALKPHOS 65 04/28/2022   AST 15 04/28/2022   ALT 9 04/28/2022   PROT 7.0 04/28/2022   ALBUMIN 4.1 04/28/2022   CALCIUM 8.9 07/05/2023   ANIONGAP 11 07/05/2023   EGFR 82 04/28/2022   GFR 119.38 03/19/2016   Lab Results  Component Value Date   CHOL 234 (H) 04/28/2022   Lab Results  Component Value Date   HDL 62 04/28/2022   Lab Results  Component Value Date   LDLCALC 159 (H) 04/28/2022   Lab Results  Component Value Date   TRIG 76 04/28/2022   Lab Results  Component Value Date  CHOLHDL 3.8 04/28/2022   Lab Results  Component Value Date   HGBA1C 5.1 03/25/2021   HGBA1C 5.1 03/25/2021   HGBA1C 5.1 (A) 03/25/2021   HGBA1C 5.1 03/25/2021      Assessment & Plan:   Problem List Items Addressed This Visit       Endocrine   Hypothyroidism following radioiodine therapy   Lab Results  Component Value Date   TSH 78.000 (H) 04/28/2022    - TSH + free T4 - levothyroxine  (SYNTHROID ) 175 MCG tablet; Take 1 tablet (175 mcg total) by mouth daily before breakfast.  Dispense: 30 tablet; Refill: 1       Relevant Medications   levothyroxine  (SYNTHROID ) 175 MCG tablet   Other Relevant Orders   TSH + free T4     Musculoskeletal and Integument   Osteoarthritis of carpometacarpal (CMC) joint of left thumb - Primary   Pain is currently rated 9/10, she is established with orthopedics Encouraged to take prednisone  with food, avoid taking ibuprofen , Aleve  while taking prednisone  Follow-up with orthopedics - predniSONE  (STERAPRED UNI-PAK 21 TAB) 10 MG (21) TBPK tablet; Take with food and as instructed on the packaging  Dispense: 1 each; Refill: 0 - ketorolac  (TORADOL ) 30 MG/ML injection 30 mg - methylPREDNISolone  sodium succinate (SOLU-MEDROL ) 40 MG injection 40 mg       Relevant Medications   predniSONE  (STERAPRED UNI-PAK 21 TAB) 10 MG (21) TBPK tablet     Genitourinary   BV (bacterial vaginosis)   Continue Flagyl  500 mg twice daily as ordered        Other   Anemia   Lab Results  Component Value Date   WBC 2.8 (L) 07/05/2023   HGB 10.7 (L) 07/05/2023   HCT 32.8 (L) 07/05/2023   MCV 90.6 07/05/2023   PLT 201 07/05/2023   - Iron, TIBC and Ferritin Panel - Vitamin B12        Relevant Orders   Iron, TIBC and Ferritin Panel   Vitamin B12   Numbness and tingling   - Iron, TIBC and Ferritin Panel - Vitamin B12      Encounter for examination following treatment at hospital   Hospital chart reviewed, including discharge summary Medications reconciled and reviewed with the patient in detail        Meds ordered this encounter  Medications   predniSONE  (STERAPRED UNI-PAK 21 TAB) 10 MG (21) TBPK tablet    Sig: Take with food and as instructed on the packaging    Dispense:  1 each    Refill:  0   levothyroxine  (SYNTHROID ) 175 MCG tablet    Sig: Take 1 tablet (175 mcg total) by mouth daily before breakfast.     Dispense:  30 tablet    Refill:  1   ketorolac  (TORADOL ) 30 MG/ML injection 30 mg   methylPREDNISolone  sodium succinate (SOLU-MEDROL ) 40 MG injection 40 mg    Follow-up: Return in about 4 weeks (around 08/05/2023).    Lizzett Nobile R Zian Mohamed, FNP

## 2023-07-09 LAB — VITAMIN B12: Vitamin B-12: 448 pg/mL (ref 232–1245)

## 2023-07-09 LAB — IRON,TIBC AND FERRITIN PANEL
Ferritin: 18 ng/mL (ref 15–150)
Iron Saturation: 22 % (ref 15–55)
Iron: 77 ug/dL (ref 27–159)
Total Iron Binding Capacity: 353 ug/dL (ref 250–450)
UIBC: 276 ug/dL (ref 131–425)

## 2023-07-09 LAB — TSH+FREE T4
Free T4: 0.29 ng/dL — ABNORMAL LOW (ref 0.82–1.77)
TSH: 59.4 u[IU]/mL — ABNORMAL HIGH (ref 0.450–4.500)

## 2023-07-11 ENCOUNTER — Ambulatory Visit: Payer: Self-pay | Admitting: Nurse Practitioner

## 2023-07-13 ENCOUNTER — Ambulatory Visit: Admitting: Obstetrics & Gynecology

## 2023-07-14 ENCOUNTER — Ambulatory Visit: Admitting: Family Medicine

## 2023-07-18 ENCOUNTER — Ambulatory Visit: Admitting: Obstetrics and Gynecology

## 2023-08-04 ENCOUNTER — Ambulatory Visit: Payer: Self-pay | Admitting: Nurse Practitioner

## 2023-08-13 ENCOUNTER — Other Ambulatory Visit (HOSPITAL_COMMUNITY): Payer: Self-pay

## 2023-08-16 ENCOUNTER — Emergency Department (HOSPITAL_BASED_OUTPATIENT_CLINIC_OR_DEPARTMENT_OTHER)
Admission: EM | Admit: 2023-08-16 | Discharge: 2023-08-16 | Disposition: A | Discharge status: Home / Self Care | Attending: Emergency Medicine | Admitting: Emergency Medicine

## 2023-08-16 ENCOUNTER — Emergency Department (HOSPITAL_BASED_OUTPATIENT_CLINIC_OR_DEPARTMENT_OTHER)

## 2023-08-16 ENCOUNTER — Other Ambulatory Visit: Payer: Self-pay

## 2023-08-16 ENCOUNTER — Encounter (HOSPITAL_BASED_OUTPATIENT_CLINIC_OR_DEPARTMENT_OTHER): Payer: Self-pay | Admitting: Emergency Medicine

## 2023-08-16 DIAGNOSIS — S161XXA Strain of muscle, fascia and tendon at neck level, initial encounter: Secondary | ICD-10-CM | POA: Insufficient documentation

## 2023-08-16 DIAGNOSIS — Y92410 Unspecified street and highway as the place of occurrence of the external cause: Secondary | ICD-10-CM | POA: Insufficient documentation

## 2023-08-16 DIAGNOSIS — S39012A Strain of muscle, fascia and tendon of lower back, initial encounter: Secondary | ICD-10-CM | POA: Diagnosis not present

## 2023-08-16 DIAGNOSIS — M542 Cervicalgia: Secondary | ICD-10-CM | POA: Diagnosis present

## 2023-08-16 LAB — PREGNANCY, URINE: Preg Test, Ur: NEGATIVE

## 2023-08-16 MED ORDER — METHOCARBAMOL 500 MG PO TABS: 500.0000 mg | ORAL_TABLET | Freq: Two times a day (BID) | ORAL | 0 refills | Status: DC

## 2023-08-16 MED ORDER — IBUPROFEN 800 MG PO TABS
800.0000 mg | ORAL_TABLET | Freq: Once | ORAL | Status: AC
  Administered 2023-08-16: 800 mg via ORAL
  Filled 2023-08-16: qty 1
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
## 2023-08-16 NOTE — Discharge Instructions (Addendum)
 Your imaging did not show any fracture, dislocation, or other serious injury, findings are consistent with cervical and lumbar strain as we discussed.  Please use Tylenol  or ibuprofen  for pain.  You may use 600 mg ibuprofen  every 6 hours or 1000 mg of Tylenol  every 6 hours.  You may choose to alternate between the 2.  This would be most effective.  Not to exceed 4 g of Tylenol  within 24 hours.  Not to exceed 3200 mg ibuprofen  24 hours.  You can use the muscle relaxant I am prescribing in addition to the above to help with any breakthrough pain.  You can take it up to twice daily.  It is safe to take at night, but I would be cautious taking it during the day as it can cause some drowsiness.  Make sure that you are feeling awake and alert before you get behind the wheel of a car or operate a motor vehicle.  It is not a narcotic pain medication so you are able to take it if it is not making you drowsy and still pilot a vehicle or machinery safely.
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
## 2023-08-23 ENCOUNTER — Telehealth: Payer: Self-pay

## 2023-08-23 NOTE — Transitions of Care (Post Inpatient/ED Visit) (Signed)
   08/23/2023  Name: Lacey Mccarty MRN: [id number] DOB: 05-Dec-1975  Today's TOC FU Call Status:   Patient's Name and Date of Birth confirmed.  Transition Care Management Follow-up Telephone Call Date of Discharge: 08/16/23 Discharge Facility: Other ([facility]) Name of Other ([facility]) Discharge Facility: [facility] Type of Discharge: Emergency Department Reason for ED Visit: Other: How have you been since you were released from the hospital?: Better Any questions or concerns?: No  Items Reviewed: Did you receive and understand the discharge instructions provided?: Yes Medications obtained,verified, and reconciled?: Yes (Medications Reviewed) Any new allergies since your discharge?: No Dietary orders reviewed?: NA Do you have support at home?: Yes People in Home [RPT]: spouse, child(ren), adult  Medications Reviewed Today: Medications Reviewed Today     Reviewed by Starlene Charlynn BIRCH, CMA (Certified Medical Assistant) on 08/23/23 at 1012  Med List Status: <None>   Medication Order Taking? Sig Documenting Provider Last Dose Status Informant  albuterol  (VENTOLIN  HFA) 108 (90 Base) MCG/ACT inhaler [id number] Yes Inhale 1-2 puffs into the lungs every 6 (six) hours as needed for wheezing or shortness of breath. Eudelia Maude SAUNDERS, PA-C  Active Self  levothyroxine  (SYNTHROID ) 175 MCG tablet [id number] Yes Take 1 tablet (175 mcg total) by mouth daily before breakfast. Paseda, Folashade R, FNP  Active   megestrol  (MEGACE ) 40 MG tablet [id number]  Take two tablets three times daily for 3 days, then two tablets twice daily  Patient not taking: Reported on 07/08/2023   Ajewole, Christana, MD  Active Self  meloxicam  (MOBIC ) 7.5 MG tablet [id number]  Take 1 tablet (7.5 mg total) by mouth daily.  Patient not taking: Reported on 07/08/2023   Fredirick Glenys GORMAN, MD  Active   methocarbamol  (ROBAXIN ) 500 MG tablet [id number] Yes Take 1 tablet (500 mg total) by mouth 2 (two) times daily. Prosperi,  Christian H, PA-C  Active   predniSONE  (STERAPRED UNI-PAK 21 TAB) 10 MG (21) TBPK tablet [id number] Yes Take with food and as instructed on the packaging Paseda, Folashade R, FNP  Active             Home Care and Equipment/Supplies: Were Home Health Services Ordered?: NA Any new equipment or medical supplies ordered?: NA  Functional Questionnaire: Do you need assistance with bathing/showering or dressing?: No Do you need assistance with meal preparation?: No Do you need assistance with eating?: No Do you have difficulty maintaining continence: No Do you need assistance with getting out of bed/getting out of a chair/moving?: No Do you have difficulty managing or taking your medications?: No  Follow up appointments reviewed: PCP Follow-up appointment confirmed?: Yes Date of PCP follow-up appointment?: 09/02/23 Specialist Hospital Follow-up appointment confirmed?: NA Do you need transportation to your follow-up appointment?: No Do you understand care options if your condition(s) worsen?: Yes-patient verbalized understanding  SDOH Interventions Today    Flowsheet Row Most Recent Value  SDOH Interventions   Food Insecurity Interventions Intervention Not Indicated  Housing Interventions Intervention Not Indicated  Transportation Interventions Intervention Not Indicated    SIGNATURE Adlee Paar, RMA

## 2023-09-02 ENCOUNTER — Encounter: Payer: Self-pay | Admitting: Nurse Practitioner

## 2023-09-02 ENCOUNTER — Ambulatory Visit: Payer: Self-pay | Admitting: Nurse Practitioner

## 2023-09-02 VITALS — BP 124/81 | HR 79 | Temp 98.4°F | Wt 207.0 lb

## 2023-09-02 DIAGNOSIS — I10 Essential (primary) hypertension: Secondary | ICD-10-CM | POA: Diagnosis not present

## 2023-09-02 DIAGNOSIS — M542 Cervicalgia: Secondary | ICD-10-CM | POA: Diagnosis not present

## 2023-09-02 MED ORDER — AMLODIPINE BESYLATE 2.5 MG PO TABS: 2.5000 mg | ORAL_TABLET | Freq: Every day | ORAL | 1 refills | Status: AC
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
## 2023-09-28 ENCOUNTER — Ambulatory Visit: Admitting: Physical Medicine and Rehabilitation

## 2023-10-19 ENCOUNTER — Ambulatory Visit: Admitting: Nurse Practitioner

## 2023-10-24 ENCOUNTER — Encounter (HOSPITAL_BASED_OUTPATIENT_CLINIC_OR_DEPARTMENT_OTHER): Payer: Self-pay | Admitting: *Deleted

## 2023-10-24 ENCOUNTER — Emergency Department (HOSPITAL_BASED_OUTPATIENT_CLINIC_OR_DEPARTMENT_OTHER)
Admission: EM | Admit: 2023-10-24 | Discharge: 2023-10-24 | Disposition: A | Discharge status: Home / Self Care | Payer: Self-pay | Attending: Emergency Medicine | Admitting: Emergency Medicine

## 2023-10-24 ENCOUNTER — Other Ambulatory Visit: Payer: Self-pay

## 2023-10-24 DIAGNOSIS — Z7951 Long term (current) use of inhaled steroids: Secondary | ICD-10-CM | POA: Insufficient documentation

## 2023-10-24 DIAGNOSIS — E039 Hypothyroidism, unspecified: Secondary | ICD-10-CM | POA: Insufficient documentation

## 2023-10-24 DIAGNOSIS — X58XXXA Exposure to other specified factors, initial encounter: Secondary | ICD-10-CM | POA: Insufficient documentation

## 2023-10-24 DIAGNOSIS — Z7952 Long term (current) use of systemic steroids: Secondary | ICD-10-CM | POA: Insufficient documentation

## 2023-10-24 DIAGNOSIS — S40021A Contusion of right upper arm, initial encounter: Secondary | ICD-10-CM | POA: Insufficient documentation

## 2023-10-24 DIAGNOSIS — R2231 Localized swelling, mass and lump, right upper limb: Secondary | ICD-10-CM | POA: Insufficient documentation

## 2023-10-24 DIAGNOSIS — J45909 Unspecified asthma, uncomplicated: Secondary | ICD-10-CM | POA: Insufficient documentation

## 2023-10-24 DIAGNOSIS — Z79899 Other long term (current) drug therapy: Secondary | ICD-10-CM | POA: Insufficient documentation

## 2023-10-24 DIAGNOSIS — H0011 Chalazion right upper eyelid: Secondary | ICD-10-CM | POA: Insufficient documentation

## 2023-10-24 LAB — CBC WITH DIFFERENTIAL/PLATELET
Abs Immature Granulocytes: 0.01 K/uL (ref 0.00–0.07)
Basophils Absolute: 0 K/uL (ref 0.0–0.1)
Basophils Relative: 1 %
Eosinophils Absolute: 0 K/uL (ref 0.0–0.5)
Eosinophils Relative: 1 %
HCT: 39 % (ref 36.0–46.0)
Hemoglobin: 13 g/dL (ref 12.0–15.0)
Immature Granulocytes: 0 %
Lymphocytes Relative: 35 %
Lymphs Abs: 1.4 K/uL (ref 0.7–4.0)
MCH: 30.4 pg (ref 26.0–34.0)
MCHC: 33.3 g/dL (ref 30.0–36.0)
MCV: 91.1 fL (ref 80.0–100.0)
Monocytes Absolute: 0.2 K/uL (ref 0.1–1.0)
Monocytes Relative: 6 %
Neutro Abs: 2.3 K/uL (ref 1.7–7.7)
Neutrophils Relative %: 57 %
Platelets: 230 K/uL (ref 150–400)
RBC: 4.28 MIL/uL (ref 3.87–5.11)
RDW: 14.6 % (ref 11.5–15.5)
WBC: 3.9 K/uL — ABNORMAL LOW (ref 4.0–10.5)
nRBC: 0 % (ref 0.0–0.2)

## 2023-10-24 LAB — PROTIME-INR
INR: 0.9 (ref 0.8–1.2)
Prothrombin Time: 13 s (ref 11.4–15.2)

## 2023-10-24 LAB — BASIC METABOLIC PANEL WITH GFR
Anion gap: 11 (ref 5–15)
BUN: 15 mg/dL (ref 6–20)
CO2: 26 mmol/L (ref 22–32)
Calcium: 9.4 mg/dL (ref 8.9–10.3)
Chloride: 105 mmol/L (ref 98–111)
Creatinine, Ser: 0.87 mg/dL (ref 0.44–1.00)
GFR, Estimated: >60 mL/min (ref >60)
Glucose, Bld: 89 mg/dL (ref 70–99)
Potassium: 3.8 mmol/L (ref 3.5–5.1)
Sodium: 142 mmol/L (ref 135–145)

## 2023-10-24 NOTE — ED Provider Notes (Signed)
 [place] [facility] AT [facility] Provider Note   CSN: [id number] Arrival date & time: 10/24/23  1155     Patient presents with: Bruise   Lacey Mccarty is a 48 y.o. female who presents to the ED with notable complaints today.  Her primary concern is a bruise on the anterior right bicep, stated that she first noticed it over the weekend and is concerned as there is a firm mass underneath of the bruise, does not have any recollection of recent trauma.  She states that she has a history of easy bruising, but states that this is concerning her as there is a firm nodule in the area as she is concerned for blood clots.  Her next primary concern is of a firm nodule to the right superior eyelid, states it is nontender nonpainful, and that there is no drainage from the right eye.  She has had this happen before, cannot recall what the diagnosis was but did have to see ophthalmology for resolution.  Her final concern is that she has a small tender nodule on the right third digit, the dorsal aspect at the PIP joint.  Again, does not recall any recent injuries, states has been present for several weeks and has not improved over that time that has not worsened.  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   HPI     Prior to Admission medications   Medication Sig Start Date End Date Taking? Authorizing Provider  albuterol  (VENTOLIN  HFA) 108 (90 Base) MCG/ACT inhaler Inhale 1-2 puffs into the lungs every 6 (six) hours as needed for wheezing or shortness of breath. 05/18/21   Eudelia Maude SAUNDERS, PA-C  amLODipine  (NORVASC ) 2.5 MG tablet Take 1 tablet (2.5 mg total) by mouth daily. 09/02/23   Oley Bascom GORMAN, NP  levothyroxine  (SYNTHROID ) 175 MCG tablet Take 1 tablet (175 mcg total) by mouth daily before breakfast. 07/08/23   Paseda, Folashade R, FNP  megestrol  (MEGACE )  40 MG tablet Take two tablets three times daily for 3 days, then two tablets twice daily Patient not taking: Reported on 07/08/2023 09/29/22   Ajewole, Christana, MD  meloxicam  (MOBIC ) 7.5 MG tablet Take 1 tablet (7.5 mg total) by mouth daily. Patient not taking: Reported on 07/08/2023 12/01/22   Fredirick Glenys GORMAN, MD  methocarbamol  (ROBAXIN ) 500 MG tablet Take 1 tablet (500 mg total) by mouth 2 (two) times daily. 08/16/23   Prosperi, Christian H, PA-C  predniSONE  (STERAPRED UNI-PAK 21 TAB) 10 MG (21) TBPK tablet Take with food and as instructed on the packaging 07/08/23   Paseda, Folashade R, FNP    Allergies: Tapazole  [methimazole ]    Review of Systems  Eyes:  Negative for pain, discharge and itching.  Respiratory:  Negative for chest tightness and shortness of breath.   Cardiovascular:  Negative for chest pain and leg swelling.  Skin:  Positive for color change.  Neurological:  Negative for dizziness and weakness.  Hematological:  Bruises/bleeds easily.  All other systems reviewed and are negative.   Updated Vital Signs BP 113/81 (BP Location: Right Arm)   Pulse 100   Temp 98 F (36.7 C) (Oral)   Resp 16   SpO2 100%   Physical Exam Vitals and nursing note reviewed.  Constitutional:      General: She is not in acute distress.    Appearance: Normal appearance.  HENT:     Head: Normocephalic  and atraumatic.     Mouth/Throat:     Mouth: Mucous membranes are moist.     Pharynx: Oropharynx is clear.  Eyes:     General: Vision grossly intact. Gaze aligned appropriately.     Extraocular Movements: Extraocular movements intact.     Conjunctiva/sclera: Conjunctivae normal.     Pupils: Pupils are equal, round, and reactive to light.      Comments: Firm nontender nodule noted to the right superior eyelid, not involving the eyelash follicles.  Cardiovascular:     Rate and Rhythm: Normal rate and regular rhythm.     Pulses: Normal pulses.     Heart sounds: Normal heart sounds. No murmur  heard.    No friction rub. No gallop.  Pulmonary:     Effort: Pulmonary effort is normal.     Breath sounds: Normal breath sounds.  Abdominal:     General: Abdomen is flat. Bowel sounds are normal.     Palpations: Abdomen is soft.  Musculoskeletal:        General: Normal range of motion.     Cervical back: Normal range of motion and neck supple.     Right lower leg: No edema.     Left lower leg: No edema.  Skin:    General: Skin is warm and dry.     Capillary Refill: Capillary refill takes less than 2 seconds.     Findings: Bruising present.         Comments: Noted a approximately 3 cm bruise, late stages of resolution, with a firm subcutaneous nodule likely hematoma.  Neurological:     General: No focal deficit present.     Mental Status: She is alert. Mental status is at baseline.  Psychiatric:        Mood and Affect: Mood normal.     (all labs ordered are listed, but only abnormal results are displayed) Labs Reviewed  CBC WITH DIFFERENTIAL/PLATELET - Abnormal; Notable for the following components:      Result Value   WBC 3.9 (*)    All other components within normal limits  BASIC METABOLIC PANEL WITH GFR  PROTIME-INR    EKG: None  Radiology: No results found.   Procedures   Medications Ordered in the ED - No data to display                                  Medical Decision Making Amount and/or Complexity of Data Reviewed Labs: ordered.   Medical Decision Making:   Lacey Mccarty is a 48 y.o. female who presented to the ED today with multiple complaints detailed above.   (1) bruise to the right bicep, (2) nodule to the right superior eyelid, (3) small tender nodule to the dorsal PIP joint of the third digit of the right hand.  Complete initial physical exam performed, notably the patient  was alert and oriented in no apparent distress, physical exam as noted with findings as noted..    Reviewed and confirmed nursing documentation for past medical  history, family history, social history.    Initial Assessment:   With the patient's presentation of various complaints, most likely diagnosis is related to the right biceps, seems consistent with simple bruise from incidental trauma consider coagulopathy.  Related to the nodule today right superior eyelid, consider chalazion or epidermal inclusion cyst, stye is less than likely as the location is not over the margin of  the eyelid.  Regarding the nodule to the dorsal PIP joint of the right third digit, likely due to repetitive trauma, pressure injury.     Initial Plan:  Obtain PT/INR to address coagulation status Screening labs including CBC and Metabolic panel to evaluate for infectious or metabolic etiology of disease.  Objective evaluation as below reviewed   Initial Study Results:   Laboratory  All laboratory results reviewed without evidence of clinically relevant pathology.   Exceptions include: None  Radiology:  Based on exam findings, imaging deferred at this time.   Reassessment and Plan:   Lab workup is unremarkable, PT/INR was normal, CBC is unremarkable as well.  Doubt coagulopathy at this time, likely bruise secondary to incidental trauma will resolve with time, discussed warm to the area to aid in resolution.  Regarding the findings of the right eyelid, this is likely other chalazion or a epidermal inclusion cyst.  Will refer to ophthalmology for further evaluation.  Discussed application of warm compresses to the right eye to manage potential chalazion.  Regarding the nodule on the right third finger, believe this is from repetitive trauma, discussed offloading, applying bandage to facilitate reduction of pressure over the area, follow-up to primary care.  This was discussed with the patient, she understands and agrees has no further concerns at this time.  As her vital signs have remained stable, and workup is benign find she is stable for discharge and outpatient management at  this time       Final diagnoses:  Chalazion of right upper eyelid  Contusion of right upper arm, initial encounter  Nodule of finger of right hand    ED Discharge Orders     None          Myriam Dorn BROCKS, PA 10/24/23 1421    Tonia Chew, MD 10/24/23 1519

## 2023-10-24 NOTE — ED Triage Notes (Addendum)
 Pt is here for evaluation of a bruise on her right arm which she first noted on Saturday as well as some swelling to her right upper eyelid (appears that she has two lumps to her right upper lid-not inflamed, not painful) and a knot on right middle finger which she has had for months.None of the symptoms have been associated with any trauma.

## 2023-10-25 ENCOUNTER — Other Ambulatory Visit: Payer: Self-pay

## 2023-10-25 ENCOUNTER — Emergency Department (HOSPITAL_BASED_OUTPATIENT_CLINIC_OR_DEPARTMENT_OTHER)
Admission: EM | Admit: 2023-10-25 | Discharge: 2023-10-25 | Disposition: A | Discharge status: Home / Self Care | Payer: Self-pay | Attending: Emergency Medicine | Admitting: Emergency Medicine

## 2023-10-25 ENCOUNTER — Encounter (HOSPITAL_BASED_OUTPATIENT_CLINIC_OR_DEPARTMENT_OTHER): Payer: Self-pay | Admitting: Emergency Medicine

## 2023-10-25 DIAGNOSIS — E039 Hypothyroidism, unspecified: Secondary | ICD-10-CM | POA: Insufficient documentation

## 2023-10-25 DIAGNOSIS — Z79899 Other long term (current) drug therapy: Secondary | ICD-10-CM | POA: Diagnosis not present

## 2023-10-25 DIAGNOSIS — Y92410 Unspecified street and highway as the place of occurrence of the external cause: Secondary | ICD-10-CM | POA: Diagnosis not present

## 2023-10-25 DIAGNOSIS — R519 Headache, unspecified: Secondary | ICD-10-CM | POA: Diagnosis present

## 2023-10-25 DIAGNOSIS — M546 Pain in thoracic spine: Secondary | ICD-10-CM | POA: Insufficient documentation

## 2023-10-25 DIAGNOSIS — J45909 Unspecified asthma, uncomplicated: Secondary | ICD-10-CM | POA: Insufficient documentation

## 2023-10-25 DIAGNOSIS — M791 Myalgia, unspecified site: Secondary | ICD-10-CM | POA: Diagnosis not present

## 2023-10-25 MED ORDER — METHOCARBAMOL 500 MG PO TABS: 500.0000 mg | ORAL_TABLET | Freq: Two times a day (BID) | ORAL | 0 refills | Status: AC
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
## 2023-11-09 ENCOUNTER — Ambulatory Visit: Admitting: Nurse Practitioner

## 2023-12-12 ENCOUNTER — Encounter: Payer: Self-pay | Admitting: Radiology

## 2024-02-10 ENCOUNTER — Other Ambulatory Visit: Payer: Self-pay

## 2024-02-10 ENCOUNTER — Emergency Department
Admission: EM | Admit: 2024-02-10 | Discharge: 2024-02-10 | Discharge status: Against Medical Advice | Payer: Self-pay | Attending: Emergency Medicine | Admitting: Emergency Medicine

## 2024-02-10 DIAGNOSIS — Z5321 Procedure and treatment not carried out due to patient leaving prior to being seen by health care provider: Secondary | ICD-10-CM | POA: Insufficient documentation

## 2024-02-10 DIAGNOSIS — R112 Nausea with vomiting, unspecified: Secondary | ICD-10-CM | POA: Insufficient documentation

## 2024-02-10 LAB — URINALYSIS, ROUTINE W REFLEX MICROSCOPIC
Bilirubin Urine: NEGATIVE
Glucose, UA: NEGATIVE mg/dL
Hgb urine dipstick: NEGATIVE
Ketones, ur: NEGATIVE mg/dL
Leukocytes,Ua: NEGATIVE
Nitrite: NEGATIVE
Protein, ur: NEGATIVE mg/dL
Specific Gravity, Urine: 1.024 (ref 1.005–1.030)
pH: 5 (ref 5.0–8.0)

## 2024-02-10 LAB — COMPREHENSIVE METABOLIC PANEL WITH GFR
ALT: 72 U/L — ABNORMAL HIGH (ref 0–44)
AST: 84 U/L — ABNORMAL HIGH (ref 15–41)
Albumin: 4.7 g/dL (ref 3.5–5.0)
Alkaline Phosphatase: 66 U/L (ref 38–126)
Anion gap: 10 (ref 5–15)
BUN: 21 mg/dL — ABNORMAL HIGH (ref 6–20)
CO2: 27 mmol/L (ref 22–32)
Calcium: 9.8 mg/dL (ref 8.9–10.3)
Chloride: 103 mmol/L (ref 98–111)
Creatinine, Ser: 0.94 mg/dL (ref 0.44–1.00)
GFR, Estimated: >60 mL/min
Glucose, Bld: 95 mg/dL (ref 70–99)
Potassium: 4.4 mmol/L (ref 3.5–5.1)
Sodium: 140 mmol/L (ref 135–145)
Total Bilirubin: 0.7 mg/dL (ref 0.0–1.2)
Total Protein: 8.1 g/dL (ref 6.5–8.1)

## 2024-02-10 LAB — CBC
HCT: 39.1 % (ref 36.0–46.0)
Hemoglobin: 13.1 g/dL (ref 12.0–15.0)
MCH: 31 pg (ref 26.0–34.0)
MCHC: 33.5 g/dL (ref 30.0–36.0)
MCV: 92.4 fL (ref 80.0–100.0)
Platelets: 205 K/uL (ref 150–400)
RBC: 4.23 MIL/uL (ref 3.87–5.11)
RDW: 14.2 % (ref 11.5–15.5)
WBC: 3.1 K/uL — ABNORMAL LOW (ref 4.0–10.5)
nRBC: 0 % (ref 0.0–0.2)

## 2024-02-10 LAB — LIPASE, BLOOD: Lipase: 27 U/L (ref 11–51)

## 2024-02-10 MED ORDER — ONDANSETRON 8 MG PO TBDP
8.0000 mg | ORAL_TABLET | Freq: Once | ORAL | Status: AC
  Administered 2024-02-10: 8 mg via ORAL
  Filled 2024-02-10: qty 1

## 2024-02-10 NOTE — ED Notes (Signed)
 Pt given apple juice and graham crackers

## 2024-02-10 NOTE — ED Triage Notes (Signed)
 Pt to ED for c/o n/v that began last night. Pt has been unable keep anything down. Pt denies diarrhea.

## 2024-02-10 NOTE — ED Provider Notes (Signed)
 Patient has left without being seen   Lacey Artist POUR, MD 02/10/24 1406

## 2024-02-10 NOTE — ED Notes (Addendum)
 Pt left without being seen.
